# Patient Record
Sex: Female | Born: 1960 | Race: White | Hispanic: No | Marital: Single | Smoking: Former smoker
Health system: Southern US, Community
[De-identification: ages and names within clinical notes are randomized; demographics above are authoritative.]

## PROBLEM LIST (undated history)

## (undated) DIAGNOSIS — M1990 Unspecified osteoarthritis, unspecified site: Secondary | ICD-10-CM

## (undated) HISTORY — PX: BACK SURGERY: SHX140

## (undated) HISTORY — PX: BREAST SURGERY: SHX581

---

## 2011-01-17 ENCOUNTER — Emergency Department (HOSPITAL_COMMUNITY): Payer: Self-pay

## 2011-01-17 ENCOUNTER — Emergency Department (HOSPITAL_COMMUNITY)
Admission: EM | Admit: 2011-01-17 | Discharge: 2011-01-17 | Disposition: A | Discharge status: Home / Self Care | Payer: Self-pay | Attending: Emergency Medicine | Admitting: Emergency Medicine

## 2011-01-17 DIAGNOSIS — R9431 Abnormal electrocardiogram [ECG] [EKG]: Secondary | ICD-10-CM | POA: Insufficient documentation

## 2011-01-17 DIAGNOSIS — K219 Gastro-esophageal reflux disease without esophagitis: Secondary | ICD-10-CM | POA: Insufficient documentation

## 2011-01-17 DIAGNOSIS — F411 Generalized anxiety disorder: Secondary | ICD-10-CM | POA: Insufficient documentation

## 2011-01-17 DIAGNOSIS — R0789 Other chest pain: Secondary | ICD-10-CM | POA: Insufficient documentation

## 2011-01-17 DIAGNOSIS — Z79899 Other long term (current) drug therapy: Secondary | ICD-10-CM | POA: Insufficient documentation

## 2011-01-17 LAB — POCT CARDIAC MARKERS
CKMB, poc: 3.7 ng/mL (ref 1.0–8.0)
CKMB, poc: 2.8 ng/mL (ref 1.0–8.0)
Myoglobin, poc: 83.7 ng/mL (ref 12–200)
Myoglobin, poc: 55.6 ng/mL (ref 12–200)
Troponin i, poc: <0.05 ng/mL (ref 0.00–0.09)
Troponin i, poc: <0.05 ng/mL (ref 0.00–0.09)

## 2011-01-17 LAB — DIFFERENTIAL
Basophils Absolute: 0 10*3/uL (ref 0.0–0.1)
Basophils Relative: 1 % (ref 0–1)
Eosinophils Absolute: 0.2 10*3/uL (ref 0.0–0.7)
Eosinophils Relative: 2 % (ref 0–5)
Lymphocytes Relative: 29 % (ref 12–46)
Lymphs Abs: 2.3 10*3/uL (ref 0.7–4.0)
Monocytes Absolute: 0.3 10*3/uL (ref 0.1–1.0)
Monocytes Relative: 4 % (ref 3–12)
Neutro Abs: 5.2 10*3/uL (ref 1.7–7.7)
Neutrophils Relative %: 65 % (ref 43–77)

## 2011-01-17 LAB — CBC
HCT: 34.6 % — ABNORMAL LOW (ref 36.0–46.0)
Hemoglobin: 11.1 g/dL — ABNORMAL LOW (ref 12.0–15.0)
MCH: 25.9 pg — ABNORMAL LOW (ref 26.0–34.0)
MCHC: 32.1 g/dL (ref 30.0–36.0)
MCV: 80.8 fL (ref 78.0–100.0)
Platelets: 242 10*3/uL (ref 150–400)
RBC: 4.28 MIL/uL (ref 3.87–5.11)
RDW: 14.5 % (ref 11.5–15.5)
WBC: 7.9 10*3/uL (ref 4.0–10.5)

## 2011-01-17 LAB — BASIC METABOLIC PANEL
BUN: 10 mg/dL (ref 6–23)
CO2: 24 meq/L (ref 19–32)
Calcium: 9.6 mg/dL (ref 8.4–10.5)
Chloride: 104 meq/L (ref 96–112)
Creatinine, Ser: 0.81 mg/dL (ref 0.4–1.2)
GFR calc Af Amer: >60 mL/min (ref >60)
GFR calc non Af Amer: >60 mL/min (ref >60)
Glucose, Bld: 110 mg/dL — ABNORMAL HIGH (ref 70–99)
Potassium: 3.5 meq/L (ref 3.5–5.1)
Sodium: 137 meq/L (ref 135–145)

## 2011-05-22 ENCOUNTER — Encounter: Payer: Self-pay | Admitting: Emergency Medicine

## 2011-05-22 ENCOUNTER — Emergency Department (HOSPITAL_COMMUNITY)
Admission: EM | Admit: 2011-05-22 | Discharge: 2011-05-22 | Disposition: A | Discharge status: Home / Self Care | Payer: Self-pay | Attending: Emergency Medicine | Admitting: Emergency Medicine

## 2011-05-22 DIAGNOSIS — Z87891 Personal history of nicotine dependence: Secondary | ICD-10-CM | POA: Insufficient documentation

## 2011-05-22 DIAGNOSIS — L03119 Cellulitis of unspecified part of limb: Secondary | ICD-10-CM | POA: Insufficient documentation

## 2011-05-22 DIAGNOSIS — W57XXXA Bitten or stung by nonvenomous insect and other nonvenomous arthropods, initial encounter: Secondary | ICD-10-CM

## 2011-05-22 DIAGNOSIS — L02519 Cutaneous abscess of unspecified hand: Secondary | ICD-10-CM | POA: Insufficient documentation

## 2011-05-22 HISTORY — DX: Unspecified osteoarthritis, unspecified site: M19.90

## 2011-05-22 MED ORDER — DOXYCYCLINE HYCLATE 100 MG PO CAPS: 100.0000 mg | ORAL_CAPSULE | Freq: Two times a day (BID) | ORAL | Status: AC

## 2011-05-22 MED ORDER — DOXYCYCLINE HYCLATE 100 MG PO TABS
100.0000 mg | ORAL_TABLET | Freq: Once | ORAL | Status: AC
  Administered 2011-05-22: 100 mg via ORAL
  Filled 2011-05-22: qty 1

## 2011-05-22 NOTE — Discharge Instructions (Signed)
Bug Bites Mosquitoes, flies, fleas, bedbugs, black flies, sand flies, spiders, ticks and many other insects can bite. Insect bites are different from insect stings. A sting is when venom is injected into the skin. Most of the time bug bites redden, swell up and itch for 2 to 4 days, and then go away. First aid for most bug bites includes washing the area thoroughly with soap and water. You may use ice or a cold pack to reduce the swelling. You can also apply a small amount of meat tenderizer or baking soda paste to the bite area for a short period. Keep the area clean and do not scratch when it itches. You can reduce the itching and swelling by applying a cortisone cream or calamine to the bite area 4 times a day. Antihistamine and cortisone medicines may be used to relieve symptoms. Some insect bites can transmit infectious diseases, for example: ticks (Lyme disease) and mosquitoes (West Nile virus). Bug bites can get infected or cause you to become ill. If the area near the bite becomes more swollen, red, or painful over the next 2 days, you may need to start antibiotics. YOU MIGHT NEED A TETANUS SHOT NOW IF:  You have no idea when you had the last one.   You have never had a tetanus shot before.   Your bite has dirt in it.   If you need a tetanus shot, and you decide not to get one, there is a rare chance of getting tetanus. Sickness from tetanus can be serious.  If you got a tetanus shot, your arm may swell, get red and warm to the touch at the shot site. This is common and not a problem. SEEK IMMEDIATE MEDICAL CARE IF:  There is increased pain, redness or swelling in the bite area or a red streak develops on the skin near the bite area.   You or your child has an oral temperature above 102 F (38.9 C), not controlled by medicine.   Your baby is older than 3 months with a rectal temperature of 102 F (38.9 C) or higher.   Your baby is 7 months old or younger with a rectal temperature of  100.4 F (38 C) or higher.   You or your child develops new joint pain.   You or your child develops a headache or neck pain.   You or your child develops unusual weakness or loss of strength.   You or your child develops a rash.  Document Released: 11/10/2004 Document Re-Released: 12/28/2009 [facility] Patient Information 2011 [place], [place].   Take the antibiotic as directed.  Wash with soap and water twice daily.  Soak in warm water 5-10 min several times daily.  Return as needed.

## 2011-05-22 NOTE — ED Provider Notes (Signed)
History     CSN: [id number] Arrival date & time: 05/22/2011  9:21 AM  Chief Complaint  Patient presents with  . Abscess   Patient is a 50 y.o. female presenting with abscess. The history is provided by the patient (pt thinks a spider may have bit on the hand ~ 3 weeks ago.).  Abscess  The abscess is present on the right hand. The problem is moderate. The abscess is characterized by redness. The abscess first occurred at home. Pertinent negatives include no anorexia, no decrease in physical activity, no fever, no fussiness, not sleeping more and no rhinorrhea.    Past Medical History  Diagnosis Date  . Arthritis     Past Surgical History  Procedure Date  . Back surgery   . Breast surgery     Family History  Problem Relation Age of Onset  . Arthritis Mother     History  Substance Use Topics  . Smoking status: Former Games developer  . Smokeless tobacco: Not on file  . Alcohol Use: No    OB History    Grav Para Term Preterm Abortions TAB SAB Ect Mult Living                  Review of Systems  Constitutional: Negative for fever.  HENT: Negative for rhinorrhea.   Gastrointestinal: Negative for anorexia.  Skin: Positive for wound.  All other systems reviewed and are negative.    Physical Exam  BP 102/59  Pulse 90  Temp(Src) 98.1 F (36.7 C) (Oral)  Resp 18  LMP 05/22/2011  Physical Exam  Nursing note and vitals reviewed. Constitutional: She is oriented to person, place, and time. Vital signs are normal. She appears well-developed and well-nourished. No distress.  HENT:  Head: Normocephalic and atraumatic.  Right Ear: External ear normal.  Left Ear: External ear normal.  Nose: Nose normal.  Mouth/Throat: No oropharyngeal exudate.  Eyes: Conjunctivae and EOM are normal. Pupils are equal, round, and reactive to light. Right eye exhibits no discharge. Left eye exhibits no discharge. No scleral icterus.  Neck: Normal range of motion. Neck supple. No JVD present. No  tracheal deviation present. No thyromegaly present.  Cardiovascular: Normal rate, regular rhythm, normal heart sounds, intact distal pulses and normal pulses.  Exam reveals no gallop and no friction rub.   No murmur heard. Pulmonary/Chest: Effort normal and breath sounds normal. No stridor. No respiratory distress. She has no wheezes. She has no rales. She exhibits no tenderness.  Abdominal: Soft. Normal appearance and bowel sounds are normal. She exhibits no distension and no mass. There is no tenderness. There is no rebound and no guarding.  Musculoskeletal: Normal range of motion. She exhibits no edema and no tenderness.       Right hand: She exhibits tenderness. She exhibits normal range of motion, no bony tenderness, normal capillary refill, no deformity, no laceration and no swelling. normal sensation noted. Normal strength noted.       Hands: Lymphadenopathy:    She has no cervical adenopathy.  Neurological: She is alert and oriented to person, place, and time. She has normal reflexes. No cranial nerve deficit. Coordination normal. GCS eye subscore is 4. GCS verbal subscore is 5. GCS motor subscore is 6.  Reflex Scores:      Tricep reflexes are 2+ on the right side and 2+ on the left side.      Bicep reflexes are 2+ on the right side and 2+ on the left side.  Brachioradialis reflexes are 2+ on the right side and 2+ on the left side.      Patellar reflexes are 2+ on the right side and 2+ on the left side.      Achilles reflexes are 2+ on the right side and 2+ on the left side. Skin: Skin is warm and dry. No rash noted. She is not diaphoretic. There is erythema.  Psychiatric: She has a normal mood and affect. Her speech is normal and behavior is normal. Judgment and thought content normal. Cognition and memory are normal.    ED Course  Procedures  MDM       Worthy Rancher, PA 05/22/11 1014  Worthy Rancher, PA 05/22/11 1016  Worthy Rancher, [place] 05/22/11 1019

## 2011-05-22 NOTE — ED Notes (Signed)
Pt noticed a sore x 3 wks ago around r ring finger. States lately has been more red and swelling. Redness and slight swelling noted. Nad. Denies pain.

## 2011-05-23 NOTE — ED Provider Notes (Signed)
Medical screening examination/treatment/procedure(s) were performed by non-physician practitioner and as supervising physician I was immediately available for consultation/collaboration.   Nechelle Petrizzo B. Bernette Mayers, MD 05/23/11 [phone]

## 2016-02-03 DIAGNOSIS — Z6841 Body Mass Index (BMI) 40.0 and over, adult: Secondary | ICD-10-CM | POA: Diagnosis not present

## 2016-02-03 DIAGNOSIS — M5432 Sciatica, left side: Secondary | ICD-10-CM | POA: Diagnosis not present

## 2016-03-25 DIAGNOSIS — M47816 Spondylosis without myelopathy or radiculopathy, lumbar region: Secondary | ICD-10-CM | POA: Diagnosis not present

## 2016-03-25 DIAGNOSIS — M5432 Sciatica, left side: Secondary | ICD-10-CM | POA: Diagnosis not present

## 2016-03-25 DIAGNOSIS — Z6841 Body Mass Index (BMI) 40.0 and over, adult: Secondary | ICD-10-CM | POA: Diagnosis not present

## 2016-03-25 DIAGNOSIS — M5442 Lumbago with sciatica, left side: Secondary | ICD-10-CM | POA: Diagnosis not present

## 2016-05-10 DIAGNOSIS — F419 Anxiety disorder, unspecified: Secondary | ICD-10-CM | POA: Diagnosis not present

## 2016-05-10 DIAGNOSIS — E8881 Metabolic syndrome: Secondary | ICD-10-CM | POA: Diagnosis not present

## 2016-05-10 DIAGNOSIS — E781 Pure hyperglyceridemia: Secondary | ICD-10-CM | POA: Diagnosis not present

## 2016-05-10 DIAGNOSIS — R7301 Impaired fasting glucose: Secondary | ICD-10-CM | POA: Diagnosis not present

## 2016-05-12 DIAGNOSIS — M5432 Sciatica, left side: Secondary | ICD-10-CM | POA: Diagnosis not present

## 2016-05-12 DIAGNOSIS — E6609 Other obesity due to excess calories: Secondary | ICD-10-CM | POA: Diagnosis not present

## 2016-05-12 DIAGNOSIS — R7301 Impaired fasting glucose: Secondary | ICD-10-CM | POA: Diagnosis not present

## 2016-05-12 DIAGNOSIS — E781 Pure hyperglyceridemia: Secondary | ICD-10-CM | POA: Diagnosis not present

## 2016-05-12 DIAGNOSIS — E8881 Metabolic syndrome: Secondary | ICD-10-CM | POA: Diagnosis not present

## 2016-08-09 DIAGNOSIS — Z1231 Encounter for screening mammogram for malignant neoplasm of breast: Secondary | ICD-10-CM | POA: Diagnosis not present

## 2016-08-09 DIAGNOSIS — R928 Other abnormal and inconclusive findings on diagnostic imaging of breast: Secondary | ICD-10-CM | POA: Diagnosis not present

## 2016-08-24 DIAGNOSIS — N63 Unspecified lump in unspecified breast: Secondary | ICD-10-CM | POA: Diagnosis not present

## 2016-08-24 DIAGNOSIS — Z1239 Encounter for other screening for malignant neoplasm of breast: Secondary | ICD-10-CM | POA: Diagnosis not present

## 2016-08-24 DIAGNOSIS — R921 Mammographic calcification found on diagnostic imaging of breast: Secondary | ICD-10-CM | POA: Diagnosis not present

## 2016-08-24 DIAGNOSIS — N6012 Diffuse cystic mastopathy of left breast: Secondary | ICD-10-CM | POA: Diagnosis not present

## 2016-09-10 DIAGNOSIS — H5213 Myopia, bilateral: Secondary | ICD-10-CM | POA: Diagnosis not present

## 2016-09-14 DIAGNOSIS — E781 Pure hyperglyceridemia: Secondary | ICD-10-CM | POA: Diagnosis not present

## 2016-09-14 DIAGNOSIS — E8881 Metabolic syndrome: Secondary | ICD-10-CM | POA: Diagnosis not present

## 2016-09-14 DIAGNOSIS — F419 Anxiety disorder, unspecified: Secondary | ICD-10-CM | POA: Diagnosis not present

## 2016-09-14 DIAGNOSIS — R7301 Impaired fasting glucose: Secondary | ICD-10-CM | POA: Diagnosis not present

## 2016-09-19 DIAGNOSIS — R7301 Impaired fasting glucose: Secondary | ICD-10-CM | POA: Diagnosis not present

## 2016-09-19 DIAGNOSIS — Z23 Encounter for immunization: Secondary | ICD-10-CM | POA: Diagnosis not present

## 2016-09-19 DIAGNOSIS — E6609 Other obesity due to excess calories: Secondary | ICD-10-CM | POA: Diagnosis not present

## 2016-09-19 DIAGNOSIS — E8881 Metabolic syndrome: Secondary | ICD-10-CM | POA: Diagnosis not present

## 2016-09-19 DIAGNOSIS — E781 Pure hyperglyceridemia: Secondary | ICD-10-CM | POA: Diagnosis not present

## 2016-11-09 DIAGNOSIS — N840 Polyp of corpus uteri: Secondary | ICD-10-CM | POA: Diagnosis not present

## 2016-11-09 DIAGNOSIS — Z6841 Body Mass Index (BMI) 40.0 and over, adult: Secondary | ICD-10-CM | POA: Diagnosis not present

## 2016-11-09 DIAGNOSIS — N950 Postmenopausal bleeding: Secondary | ICD-10-CM | POA: Diagnosis not present

## 2016-12-06 DIAGNOSIS — Z6841 Body Mass Index (BMI) 40.0 and over, adult: Secondary | ICD-10-CM | POA: Diagnosis not present

## 2016-12-06 DIAGNOSIS — N950 Postmenopausal bleeding: Secondary | ICD-10-CM | POA: Diagnosis not present

## 2016-12-30 DIAGNOSIS — M7552 Bursitis of left shoulder: Secondary | ICD-10-CM | POA: Diagnosis not present

## 2017-03-01 DIAGNOSIS — N6489 Other specified disorders of breast: Secondary | ICD-10-CM | POA: Diagnosis not present

## 2017-03-01 DIAGNOSIS — N6012 Diffuse cystic mastopathy of left breast: Secondary | ICD-10-CM | POA: Diagnosis not present

## 2017-03-21 DIAGNOSIS — E781 Pure hyperglyceridemia: Secondary | ICD-10-CM | POA: Diagnosis not present

## 2017-03-21 DIAGNOSIS — E8881 Metabolic syndrome: Secondary | ICD-10-CM | POA: Diagnosis not present

## 2017-03-23 DIAGNOSIS — M5431 Sciatica, right side: Secondary | ICD-10-CM | POA: Diagnosis not present

## 2017-03-23 DIAGNOSIS — R7301 Impaired fasting glucose: Secondary | ICD-10-CM | POA: Diagnosis not present

## 2017-03-23 DIAGNOSIS — E6609 Other obesity due to excess calories: Secondary | ICD-10-CM | POA: Diagnosis not present

## 2017-03-23 DIAGNOSIS — E781 Pure hyperglyceridemia: Secondary | ICD-10-CM | POA: Diagnosis not present

## 2017-03-23 DIAGNOSIS — E8881 Metabolic syndrome: Secondary | ICD-10-CM | POA: Diagnosis not present

## 2017-04-14 DIAGNOSIS — Z6839 Body mass index (BMI) 39.0-39.9, adult: Secondary | ICD-10-CM | POA: Diagnosis not present

## 2017-04-14 DIAGNOSIS — M79604 Pain in right leg: Secondary | ICD-10-CM | POA: Diagnosis not present

## 2017-04-28 DIAGNOSIS — M5441 Lumbago with sciatica, right side: Secondary | ICD-10-CM | POA: Diagnosis not present

## 2017-04-28 DIAGNOSIS — M9906 Segmental and somatic dysfunction of lower extremity: Secondary | ICD-10-CM | POA: Diagnosis not present

## 2017-04-28 DIAGNOSIS — M79661 Pain in right lower leg: Secondary | ICD-10-CM | POA: Diagnosis not present

## 2017-04-28 DIAGNOSIS — M9905 Segmental and somatic dysfunction of pelvic region: Secondary | ICD-10-CM | POA: Diagnosis not present

## 2017-05-01 DIAGNOSIS — M5441 Lumbago with sciatica, right side: Secondary | ICD-10-CM | POA: Diagnosis not present

## 2017-05-01 DIAGNOSIS — M9905 Segmental and somatic dysfunction of pelvic region: Secondary | ICD-10-CM | POA: Diagnosis not present

## 2017-05-01 DIAGNOSIS — M9906 Segmental and somatic dysfunction of lower extremity: Secondary | ICD-10-CM | POA: Diagnosis not present

## 2017-05-01 DIAGNOSIS — M79661 Pain in right lower leg: Secondary | ICD-10-CM | POA: Diagnosis not present

## 2017-05-08 DIAGNOSIS — M9905 Segmental and somatic dysfunction of pelvic region: Secondary | ICD-10-CM | POA: Diagnosis not present

## 2017-05-08 DIAGNOSIS — M9906 Segmental and somatic dysfunction of lower extremity: Secondary | ICD-10-CM | POA: Diagnosis not present

## 2017-05-08 DIAGNOSIS — M79661 Pain in right lower leg: Secondary | ICD-10-CM | POA: Diagnosis not present

## 2017-05-08 DIAGNOSIS — M5441 Lumbago with sciatica, right side: Secondary | ICD-10-CM | POA: Diagnosis not present

## 2017-05-10 DIAGNOSIS — M5441 Lumbago with sciatica, right side: Secondary | ICD-10-CM | POA: Diagnosis not present

## 2017-05-10 DIAGNOSIS — M9905 Segmental and somatic dysfunction of pelvic region: Secondary | ICD-10-CM | POA: Diagnosis not present

## 2017-05-10 DIAGNOSIS — M9906 Segmental and somatic dysfunction of lower extremity: Secondary | ICD-10-CM | POA: Diagnosis not present

## 2017-05-10 DIAGNOSIS — M79661 Pain in right lower leg: Secondary | ICD-10-CM | POA: Diagnosis not present

## 2017-05-15 DIAGNOSIS — M5441 Lumbago with sciatica, right side: Secondary | ICD-10-CM | POA: Diagnosis not present

## 2017-05-15 DIAGNOSIS — M9906 Segmental and somatic dysfunction of lower extremity: Secondary | ICD-10-CM | POA: Diagnosis not present

## 2017-05-15 DIAGNOSIS — M9905 Segmental and somatic dysfunction of pelvic region: Secondary | ICD-10-CM | POA: Diagnosis not present

## 2017-05-15 DIAGNOSIS — M79661 Pain in right lower leg: Secondary | ICD-10-CM | POA: Diagnosis not present

## 2017-05-17 DIAGNOSIS — M9905 Segmental and somatic dysfunction of pelvic region: Secondary | ICD-10-CM | POA: Diagnosis not present

## 2017-05-17 DIAGNOSIS — M5441 Lumbago with sciatica, right side: Secondary | ICD-10-CM | POA: Diagnosis not present

## 2017-05-17 DIAGNOSIS — M9906 Segmental and somatic dysfunction of lower extremity: Secondary | ICD-10-CM | POA: Diagnosis not present

## 2017-05-17 DIAGNOSIS — M79661 Pain in right lower leg: Secondary | ICD-10-CM | POA: Diagnosis not present

## 2017-05-22 DIAGNOSIS — M5441 Lumbago with sciatica, right side: Secondary | ICD-10-CM | POA: Diagnosis not present

## 2017-05-22 DIAGNOSIS — M9905 Segmental and somatic dysfunction of pelvic region: Secondary | ICD-10-CM | POA: Diagnosis not present

## 2017-05-22 DIAGNOSIS — M9906 Segmental and somatic dysfunction of lower extremity: Secondary | ICD-10-CM | POA: Diagnosis not present

## 2017-05-22 DIAGNOSIS — M79661 Pain in right lower leg: Secondary | ICD-10-CM | POA: Diagnosis not present

## 2017-05-24 DIAGNOSIS — M5441 Lumbago with sciatica, right side: Secondary | ICD-10-CM | POA: Diagnosis not present

## 2017-05-24 DIAGNOSIS — M9906 Segmental and somatic dysfunction of lower extremity: Secondary | ICD-10-CM | POA: Diagnosis not present

## 2017-05-24 DIAGNOSIS — M79661 Pain in right lower leg: Secondary | ICD-10-CM | POA: Diagnosis not present

## 2017-05-24 DIAGNOSIS — M9905 Segmental and somatic dysfunction of pelvic region: Secondary | ICD-10-CM | POA: Diagnosis not present

## 2017-05-31 DIAGNOSIS — M5441 Lumbago with sciatica, right side: Secondary | ICD-10-CM | POA: Diagnosis not present

## 2017-05-31 DIAGNOSIS — M9905 Segmental and somatic dysfunction of pelvic region: Secondary | ICD-10-CM | POA: Diagnosis not present

## 2017-05-31 DIAGNOSIS — M79661 Pain in right lower leg: Secondary | ICD-10-CM | POA: Diagnosis not present

## 2017-05-31 DIAGNOSIS — M9906 Segmental and somatic dysfunction of lower extremity: Secondary | ICD-10-CM | POA: Diagnosis not present

## 2017-06-07 DIAGNOSIS — M79661 Pain in right lower leg: Secondary | ICD-10-CM | POA: Diagnosis not present

## 2017-06-07 DIAGNOSIS — M9906 Segmental and somatic dysfunction of lower extremity: Secondary | ICD-10-CM | POA: Diagnosis not present

## 2017-06-07 DIAGNOSIS — M9905 Segmental and somatic dysfunction of pelvic region: Secondary | ICD-10-CM | POA: Diagnosis not present

## 2017-06-07 DIAGNOSIS — M5441 Lumbago with sciatica, right side: Secondary | ICD-10-CM | POA: Diagnosis not present

## 2017-06-14 DIAGNOSIS — M5441 Lumbago with sciatica, right side: Secondary | ICD-10-CM | POA: Diagnosis not present

## 2017-06-14 DIAGNOSIS — M79661 Pain in right lower leg: Secondary | ICD-10-CM | POA: Diagnosis not present

## 2017-06-14 DIAGNOSIS — M9905 Segmental and somatic dysfunction of pelvic region: Secondary | ICD-10-CM | POA: Diagnosis not present

## 2017-06-14 DIAGNOSIS — M9906 Segmental and somatic dysfunction of lower extremity: Secondary | ICD-10-CM | POA: Diagnosis not present

## 2017-07-05 DIAGNOSIS — M9906 Segmental and somatic dysfunction of lower extremity: Secondary | ICD-10-CM | POA: Diagnosis not present

## 2017-07-05 DIAGNOSIS — M5441 Lumbago with sciatica, right side: Secondary | ICD-10-CM | POA: Diagnosis not present

## 2017-07-05 DIAGNOSIS — M79661 Pain in right lower leg: Secondary | ICD-10-CM | POA: Diagnosis not present

## 2017-07-05 DIAGNOSIS — M9905 Segmental and somatic dysfunction of pelvic region: Secondary | ICD-10-CM | POA: Diagnosis not present

## 2017-07-14 DIAGNOSIS — N6320 Unspecified lump in the left breast, unspecified quadrant: Secondary | ICD-10-CM | POA: Diagnosis not present

## 2017-07-14 DIAGNOSIS — E781 Pure hyperglyceridemia: Secondary | ICD-10-CM | POA: Diagnosis not present

## 2017-07-14 DIAGNOSIS — R7301 Impaired fasting glucose: Secondary | ICD-10-CM | POA: Diagnosis not present

## 2017-07-14 DIAGNOSIS — E8881 Metabolic syndrome: Secondary | ICD-10-CM | POA: Diagnosis not present

## 2017-07-14 DIAGNOSIS — F419 Anxiety disorder, unspecified: Secondary | ICD-10-CM | POA: Diagnosis not present

## 2017-07-18 DIAGNOSIS — E8881 Metabolic syndrome: Secondary | ICD-10-CM | POA: Diagnosis not present

## 2017-07-18 DIAGNOSIS — E6609 Other obesity due to excess calories: Secondary | ICD-10-CM | POA: Diagnosis not present

## 2017-07-18 DIAGNOSIS — Z23 Encounter for immunization: Secondary | ICD-10-CM | POA: Diagnosis not present

## 2017-07-18 DIAGNOSIS — R7301 Impaired fasting glucose: Secondary | ICD-10-CM | POA: Diagnosis not present

## 2017-07-18 DIAGNOSIS — E781 Pure hyperglyceridemia: Secondary | ICD-10-CM | POA: Diagnosis not present

## 2017-08-05 DIAGNOSIS — L03319 Cellulitis of trunk, unspecified: Secondary | ICD-10-CM | POA: Diagnosis not present

## 2017-08-05 DIAGNOSIS — Z6839 Body mass index (BMI) 39.0-39.9, adult: Secondary | ICD-10-CM | POA: Diagnosis not present

## 2017-08-09 DIAGNOSIS — L03319 Cellulitis of trunk, unspecified: Secondary | ICD-10-CM | POA: Diagnosis not present

## 2017-08-16 DIAGNOSIS — M9905 Segmental and somatic dysfunction of pelvic region: Secondary | ICD-10-CM | POA: Diagnosis not present

## 2017-08-16 DIAGNOSIS — M79661 Pain in right lower leg: Secondary | ICD-10-CM | POA: Diagnosis not present

## 2017-08-16 DIAGNOSIS — M5441 Lumbago with sciatica, right side: Secondary | ICD-10-CM | POA: Diagnosis not present

## 2017-08-16 DIAGNOSIS — M9906 Segmental and somatic dysfunction of lower extremity: Secondary | ICD-10-CM | POA: Diagnosis not present

## 2017-10-18 DIAGNOSIS — Z6838 Body mass index (BMI) 38.0-38.9, adult: Secondary | ICD-10-CM | POA: Diagnosis not present

## 2017-10-18 DIAGNOSIS — L03311 Cellulitis of abdominal wall: Secondary | ICD-10-CM | POA: Diagnosis not present

## 2018-01-09 DIAGNOSIS — E781 Pure hyperglyceridemia: Secondary | ICD-10-CM | POA: Diagnosis not present

## 2018-01-09 DIAGNOSIS — M545 Low back pain: Secondary | ICD-10-CM | POA: Diagnosis not present

## 2018-01-09 DIAGNOSIS — E8881 Metabolic syndrome: Secondary | ICD-10-CM | POA: Diagnosis not present

## 2018-01-09 DIAGNOSIS — R7301 Impaired fasting glucose: Secondary | ICD-10-CM | POA: Diagnosis not present

## 2018-01-15 DIAGNOSIS — E781 Pure hyperglyceridemia: Secondary | ICD-10-CM | POA: Diagnosis not present

## 2018-01-15 DIAGNOSIS — E6609 Other obesity due to excess calories: Secondary | ICD-10-CM | POA: Diagnosis not present

## 2018-01-15 DIAGNOSIS — R7301 Impaired fasting glucose: Secondary | ICD-10-CM | POA: Diagnosis not present

## 2018-01-15 DIAGNOSIS — E8881 Metabolic syndrome: Secondary | ICD-10-CM | POA: Diagnosis not present

## 2018-07-11 DIAGNOSIS — E8881 Metabolic syndrome: Secondary | ICD-10-CM | POA: Diagnosis not present

## 2018-07-11 DIAGNOSIS — R7301 Impaired fasting glucose: Secondary | ICD-10-CM | POA: Diagnosis not present

## 2018-07-11 DIAGNOSIS — E781 Pure hyperglyceridemia: Secondary | ICD-10-CM | POA: Diagnosis not present

## 2018-07-11 DIAGNOSIS — Z13220 Encounter for screening for lipoid disorders: Secondary | ICD-10-CM | POA: Diagnosis not present

## 2018-07-17 DIAGNOSIS — Z23 Encounter for immunization: Secondary | ICD-10-CM | POA: Diagnosis not present

## 2018-07-17 DIAGNOSIS — R7301 Impaired fasting glucose: Secondary | ICD-10-CM | POA: Diagnosis not present

## 2018-07-17 DIAGNOSIS — E8881 Metabolic syndrome: Secondary | ICD-10-CM | POA: Diagnosis not present

## 2018-07-17 DIAGNOSIS — E781 Pure hyperglyceridemia: Secondary | ICD-10-CM | POA: Diagnosis not present

## 2018-07-17 DIAGNOSIS — E6609 Other obesity due to excess calories: Secondary | ICD-10-CM | POA: Diagnosis not present

## 2018-12-28 DIAGNOSIS — Z1231 Encounter for screening mammogram for malignant neoplasm of breast: Secondary | ICD-10-CM | POA: Diagnosis not present

## 2019-01-11 DIAGNOSIS — E8881 Metabolic syndrome: Secondary | ICD-10-CM | POA: Diagnosis not present

## 2019-01-11 DIAGNOSIS — R7301 Impaired fasting glucose: Secondary | ICD-10-CM | POA: Diagnosis not present

## 2019-01-11 DIAGNOSIS — Z1159 Encounter for screening for other viral diseases: Secondary | ICD-10-CM | POA: Diagnosis not present

## 2019-01-16 DIAGNOSIS — N6001 Solitary cyst of right breast: Secondary | ICD-10-CM | POA: Diagnosis not present

## 2019-01-16 DIAGNOSIS — N6311 Unspecified lump in the right breast, upper outer quadrant: Secondary | ICD-10-CM | POA: Diagnosis not present

## 2019-01-17 DIAGNOSIS — E6609 Other obesity due to excess calories: Secondary | ICD-10-CM | POA: Diagnosis not present

## 2019-01-17 DIAGNOSIS — E781 Pure hyperglyceridemia: Secondary | ICD-10-CM | POA: Diagnosis not present

## 2019-01-17 DIAGNOSIS — E8881 Metabolic syndrome: Secondary | ICD-10-CM | POA: Diagnosis not present

## 2019-01-17 DIAGNOSIS — R7301 Impaired fasting glucose: Secondary | ICD-10-CM | POA: Diagnosis not present

## 2019-02-14 DIAGNOSIS — Z79899 Other long term (current) drug therapy: Secondary | ICD-10-CM | POA: Diagnosis not present

## 2019-02-14 DIAGNOSIS — M5416 Radiculopathy, lumbar region: Secondary | ICD-10-CM | POA: Diagnosis not present

## 2019-02-14 DIAGNOSIS — G8929 Other chronic pain: Secondary | ICD-10-CM | POA: Diagnosis not present

## 2019-02-14 DIAGNOSIS — M545 Low back pain: Secondary | ICD-10-CM | POA: Diagnosis not present

## 2019-02-20 DIAGNOSIS — N644 Mastodynia: Secondary | ICD-10-CM | POA: Diagnosis not present

## 2019-02-20 DIAGNOSIS — R946 Abnormal results of thyroid function studies: Secondary | ICD-10-CM | POA: Diagnosis not present

## 2019-02-20 DIAGNOSIS — E559 Vitamin D deficiency, unspecified: Secondary | ICD-10-CM | POA: Diagnosis not present

## 2019-02-20 DIAGNOSIS — Z6837 Body mass index (BMI) 37.0-37.9, adult: Secondary | ICD-10-CM | POA: Diagnosis not present

## 2019-02-20 DIAGNOSIS — R200 Anesthesia of skin: Secondary | ICD-10-CM | POA: Diagnosis not present

## 2019-02-20 DIAGNOSIS — M069 Rheumatoid arthritis, unspecified: Secondary | ICD-10-CM | POA: Diagnosis not present

## 2019-02-20 DIAGNOSIS — M545 Low back pain: Secondary | ICD-10-CM | POA: Diagnosis not present

## 2019-02-20 DIAGNOSIS — R3914 Feeling of incomplete bladder emptying: Secondary | ICD-10-CM | POA: Diagnosis not present

## 2019-02-20 DIAGNOSIS — M25511 Pain in right shoulder: Secondary | ICD-10-CM | POA: Diagnosis not present

## 2019-02-26 DIAGNOSIS — M545 Low back pain: Secondary | ICD-10-CM | POA: Diagnosis not present

## 2019-02-26 DIAGNOSIS — R768 Other specified abnormal immunological findings in serum: Secondary | ICD-10-CM | POA: Diagnosis not present

## 2019-03-01 DIAGNOSIS — M545 Low back pain: Secondary | ICD-10-CM | POA: Diagnosis not present

## 2019-03-04 DIAGNOSIS — M545 Low back pain: Secondary | ICD-10-CM | POA: Diagnosis not present

## 2019-03-12 ENCOUNTER — Inpatient Hospital Stay (HOSPITAL_COMMUNITY): Payer: BC Managed Care – PPO | Attending: Hematology | Admitting: Hematology

## 2019-03-12 ENCOUNTER — Encounter (HOSPITAL_COMMUNITY): Payer: Self-pay | Admitting: *Deleted

## 2019-03-12 ENCOUNTER — Encounter (HOSPITAL_COMMUNITY): Payer: Self-pay | Admitting: Hematology

## 2019-03-12 ENCOUNTER — Inpatient Hospital Stay (HOSPITAL_COMMUNITY): Payer: BC Managed Care – PPO

## 2019-03-12 ENCOUNTER — Encounter (HOSPITAL_COMMUNITY): Payer: Self-pay | Admitting: Lab

## 2019-03-12 ENCOUNTER — Other Ambulatory Visit: Payer: Self-pay

## 2019-03-12 VITALS — BP 136/70 | HR 94 | Temp 98.5°F | Resp 18 | Wt 222.7 lb

## 2019-03-12 DIAGNOSIS — M899 Disorder of bone, unspecified: Secondary | ICD-10-CM

## 2019-03-12 DIAGNOSIS — Z79899 Other long term (current) drug therapy: Secondary | ICD-10-CM

## 2019-03-12 DIAGNOSIS — R531 Weakness: Secondary | ICD-10-CM

## 2019-03-12 DIAGNOSIS — M549 Dorsalgia, unspecified: Secondary | ICD-10-CM

## 2019-03-12 LAB — COMPREHENSIVE METABOLIC PANEL
ALT: 69 U/L — ABNORMAL HIGH (ref 0–44)
AST: 34 U/L (ref 15–41)
Albumin: 3 g/dL — ABNORMAL LOW (ref 3.5–5.0)
Alkaline Phosphatase: 310 U/L — ABNORMAL HIGH (ref 38–126)
Anion gap: 15 (ref 5–15)
BUN: 17 mg/dL (ref 6–20)
CO2: 23 mmol/L (ref 22–32)
Calcium: 9.7 mg/dL (ref 8.9–10.3)
Chloride: 102 mmol/L (ref 98–111)
Creatinine, Ser: 0.51 mg/dL (ref 0.44–1.00)
GFR calc Af Amer: >60 mL/min (ref >60)
GFR calc non Af Amer: >60 mL/min (ref >60)
Glucose, Bld: 114 mg/dL — ABNORMAL HIGH (ref 70–99)
Potassium: 3.8 mmol/L (ref 3.5–5.1)
Sodium: 140 mmol/L (ref 135–145)
Total Bilirubin: 0.4 mg/dL (ref 0.3–1.2)
Total Protein: 7.2 g/dL (ref 6.5–8.1)

## 2019-03-12 LAB — CBC WITH DIFFERENTIAL/PLATELET
Abs Immature Granulocytes: 0.95 10*3/uL — ABNORMAL HIGH (ref 0.00–0.07)
Basophils Absolute: 0.1 10*3/uL (ref 0.0–0.1)
Basophils Relative: 1 %
Eosinophils Absolute: 0 10*3/uL (ref 0.0–0.5)
Eosinophils Relative: 0 %
HCT: 34.4 % — ABNORMAL LOW (ref 36.0–46.0)
Hemoglobin: 10.6 g/dL — ABNORMAL LOW (ref 12.0–15.0)
Immature Granulocytes: 9 %
Lymphocytes Relative: 18 %
Lymphs Abs: 1.8 10*3/uL (ref 0.7–4.0)
MCH: 26.7 pg (ref 26.0–34.0)
MCHC: 30.8 g/dL (ref 30.0–36.0)
MCV: 86.6 fL (ref 80.0–100.0)
Monocytes Absolute: 0.5 10*3/uL (ref 0.1–1.0)
Monocytes Relative: 5 %
Neutro Abs: 6.7 10*3/uL (ref 1.7–7.7)
Neutrophils Relative %: 67 %
Platelets: 313 10*3/uL (ref 150–400)
RBC: 3.97 MIL/uL (ref 3.87–5.11)
RDW: 13.8 % (ref 11.5–15.5)
WBC: 10.1 10*3/uL (ref 4.0–10.5)
nRBC: 0 % (ref 0.0–0.2)

## 2019-03-12 NOTE — Progress Notes (Signed)
Oncology Navigator Note:  Patient was referred to our office by Dr. Oneita Kras, orthopedic surgeon. I met with patient and her husband, Shanon Brow, during the visit with Dr. Delton Coombes.  I provided the patient with my contact information.  I explained my role in her care and expressed the need for them to call me should they have any questions or concerns.  They both were given the opportunity to ask questions and they were answered to their satisfaction.  I will meet with them again at their next visit.

## 2019-03-12 NOTE — Progress Notes (Unsigned)
Referral sent to [facility] Neurosurgery.  Records faxed on 5/26.

## 2019-03-12 NOTE — Assessment & Plan Note (Addendum)
1.  Metastatic bone lesions: - Back Pain with BLE weakness (left more than right) aprroxamatley 4 weeks ago. - 03/01/2019: MRI Lumbar spine:  Innumerable bony lesions in the lumbarscaral region.  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  - Concern for Multiple Myeloma versus metastatic disease.Recommend MRI of thoracic spine as well as PET/CT to evaluate for further disease.  - Physical exam revealed 1-2 cm enlarged left axilla LN. Recommend surgical evaluation for possible biopsy. - Will also complete blood work today with multiple myeloma labs. -I will also recommend consultation from neurosurgery for the epidural tumor at L5 level. - RTC after work up to discuss treatment plan.

## 2019-03-12 NOTE — Progress Notes (Signed)
[facility] Initial Visit:  Patient Care Team: Sasser, Silvestre Moment, MD as PCP - General (Family Medicine)  CHIEF COMPLAINTS/PURPOSE OF CONSULTATION: Oncology referral for spinal lytic lesions.   HISTORY OF PRESENTING ILLNESS: Brandi Rodriguez 58 y.o. female presents today for consult regarding lytic lesions noted on spine MRI. Reports she developed back pain with alternating bilateral lower extremity pain approximately 4 weeks ago. Movement makes the pain worse. She notes left sided weakness greater than right. She denies neuropathy, but describes pain in bilateral lower extremities. She is now walking with a cane and presents in a wheelchair today.  She was sent for Lumbar MRI on 03/01/2019. Scan revealed innumerable bony lesions in the lumbarscaral region.  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   She denies any fevers, chills, or night sweats. Denies any change in bowel or bladder habits. No weight loss. Appetite is stable. She has a history of smoking. Smoking 1ppd x 15 years, quitting 10 years ago. She denies a family history of cancer. She has worked in several factories as a Scientist, forensic.    Review of Systems  Constitutional: Negative.   HENT:  Negative.   Eyes: Negative.   Respiratory: Negative.   Cardiovascular: Negative.   Gastrointestinal: Negative.   Endocrine: Negative.   Genitourinary: Negative.    Musculoskeletal: Positive for arthralgias, back pain and gait problem.  Skin: Negative.   Neurological: Positive for extremity weakness and gait problem.  Hematological: Negative.   Psychiatric/Behavioral: Negative.     MEDICAL HISTORY: Past Medical History:  Diagnosis Date  . Arthritis     SURGICAL HISTORY: Past Surgical History:  Procedure Laterality Date  . BACK SURGERY      SOCIAL HISTORY: Social History    Socioeconomic History  . Marital status: Married    Spouse name: Shanon Brow  . Number of children: 3  . Years of education: Not on file  . Highest education level: Not on file  Occupational History  . Not on file  Social Needs  . Financial resource strain: Not hard at all  . Food insecurity:    Worry: Never true    Inability: Never true  . Transportation needs:    Medical: No    Non-medical: No  Tobacco Use  . Smoking status: Former Smoker    Packs/day: 1.00    Years: 15.00    Pack years: 15.00    Types: Cigarettes    Last attempt to quit: 03/11/2009    Years since quitting: 10.0  . Smokeless tobacco: Never Used  Substance and Sexual Activity  . Alcohol use: Not Currently  . Drug use: Never  . Sexual activity: Not on file  Lifestyle  . Physical activity:    Days per week: 0 days    Minutes per session: 0 min  . Stress: Only a little  Relationships  . Social connections:    Talks on phone: Once a week    Gets together: Twice a week    Attends religious service: More than 4 times per year    Active member of club or organization: No    Attends meetings of clubs or organizations: Never    Relationship status: Married  . Intimate partner violence:    Fear of current or ex partner: No    Emotionally abused: No    Physically abused: No    Forced sexual  activity: No  Other Topics Concern  . Not on file  Social History Narrative  . Not on file    FAMILY HISTORY Family History  Problem Relation Age of Onset  . Arthritis Mother   . Heart disease Father   . Heart disease Sister   . Arthritis Brother   . Heart disease Sister     ALLERGIES:  has no allergies on file.  MEDICATIONS:  Current Outpatient Medications  Medication Sig Dispense Refill  . vitamin C (ASCORBIC ACID) 500 MG tablet Take 500 mg by mouth daily.    Marland Kitchen ALPRAZolam (XANAX) 0.5 MG tablet TAKE 1 2 TO 1 (ONE HALF TO ONE) TABLET BY MOUTH TWICE DAILY AS NEEDED FOR ANXIETY    . diclofenac sodium  (VOLTAREN) 1 % GEL APPLY 1 GRAM TOPICALLY TWICE DAILY    . ibuprofen (ADVIL) 800 MG tablet TAKE 1 TABLET BY MOUTH 4 TIMES DAILY    . venlafaxine XR (EFFEXOR-XR) 75 MG 24 hr capsule Take 75 mg by mouth daily.     No current facility-administered medications for this visit.     PHYSICAL EXAMINATION:  ECOG PERFORMANCE STATUS: 2 - Symptomatic, <50% confined to bed   Vitals:   03/12/19 1311  BP: 136/70  Pulse: 94  Resp: 18  Temp: 98.5 F (36.9 C)  SpO2: 95%    Filed Weights   03/12/19 1311  Weight: 222 lb 11.2 oz (101 kg)     Physical Exam Constitutional:      Appearance: Normal appearance. She is obese.  HENT:     Head: Normocephalic.     Mouth/Throat:     Mouth: Mucous membranes are moist.     Pharynx: Oropharynx is clear.  Eyes:     Extraocular Movements: Extraocular movements intact.     Pupils: Pupils are equal, round, and reactive to light.  Neck:     Musculoskeletal: Normal range of motion.  Cardiovascular:     Rate and Rhythm: Normal rate and regular rhythm.  Pulmonary:     Effort: Pulmonary effort is normal.     Breath sounds: Normal breath sounds.  Abdominal:     General: Bowel sounds are normal.     Palpations: Abdomen is soft.  Musculoskeletal:     Right shoulder: She exhibits normal range of motion.     Comments: Decrease strength 3/5 RLE, LLE.   Neurological:     Mental Status: She is alert.     Motor: Weakness present.     Gait: Gait abnormal.  Psychiatric:        Mood and Affect: Mood normal.        Behavior: Behavior normal.        Thought Content: Thought content normal.        Judgment: Judgment normal.   There is left axillary adenopathy.  Bilateral breast exam did not reveal any palpable masses.   LABORATORY DATA: I have personally reviewed the data as listed:  Appointment on 03/12/2019  Component Date Value Ref Range Status  . WBC 03/12/2019 10.1  4.0 - 10.5 K/uL Final  . RBC 03/12/2019 3.97  3.87 - 5.11 MIL/uL Final  .  Hemoglobin 03/12/2019 10.6* 12.0 - 15.0 g/dL Final  . HCT 03/12/2019 34.4* 36.0 - 46.0 % Final  . MCV 03/12/2019 86.6  80.0 - 100.0 fL Final  . MCH 03/12/2019 26.7  26.0 - 34.0 pg Final  . MCHC 03/12/2019 30.8  30.0 - 36.0 g/dL Final  . RDW 03/12/2019 13.8  11.5 - 15.5 % Final  . Platelets 03/12/2019 313  150 - 400 K/uL Final  . nRBC 03/12/2019 0.0  0.0 - 0.2 % Final  . Neutrophils Relative % 03/12/2019 67  % Final  . Neutro Abs 03/12/2019 6.7  1.7 - 7.7 K/uL Final  . Lymphocytes Relative 03/12/2019 18  % Final  . Lymphs Abs 03/12/2019 1.8  0.7 - 4.0 K/uL Final  . Monocytes Relative 03/12/2019 5  % Final  . Monocytes Absolute 03/12/2019 0.5  0.1 - 1.0 K/uL Final  . Eosinophils Relative 03/12/2019 0  % Final  . Eosinophils Absolute 03/12/2019 0.0  0.0 - 0.5 K/uL Final  . Basophils Relative 03/12/2019 1  % Final  . Basophils Absolute 03/12/2019 0.1  0.0 - 0.1 K/uL Final  . WBC Morphology 03/12/2019 MILD LEFT SHIFT (1-5% METAS, OCC MYELO, OCC BANDS)   Final  . Immature Granulocytes 03/12/2019 9  % Final  . Abs Immature Granulocytes 03/12/2019 0.95* 0.00 - 0.07 K/uL Final   Performed at [facility], [address], [place], [place] [address]  . Sodium 03/12/2019 140  135 - 145 mmol/L Final  . Potassium 03/12/2019 3.8  3.5 - 5.1 mmol/L Final  . Chloride 03/12/2019 102  98 - 111 mmol/L Final  . CO2 03/12/2019 23  22 - 32 mmol/L Final  . Glucose, Bld 03/12/2019 114* 70 - 99 mg/dL Final  . BUN 03/12/2019 17  6 - 20 mg/dL Final  . Creatinine, Ser 03/12/2019 0.51  0.44 - 1.00 mg/dL Final  . Calcium 03/12/2019 9.7  8.9 - 10.3 mg/dL Final  . Total Protein 03/12/2019 7.2  6.5 - 8.1 g/dL Final  . Albumin 03/12/2019 3.0* 3.5 - 5.0 g/dL Final  . AST 03/12/2019 34  15 - 41 U/L Final  . ALT 03/12/2019 69* 0 - 44 U/L Final  . Alkaline Phosphatase 03/12/2019 310* 38 - 126 U/L Final  . Total Bilirubin 03/12/2019 0.4  0.3 - 1.2 mg/dL Final  . GFR calc non Af Amer 03/12/2019 >60  >60 mL/min Final  .  GFR calc Af Amer 03/12/2019 >60  >60 mL/min Final  . Anion gap 03/12/2019 15  5 - 15 Final   Performed at [facility], [address], [place], [place] [address]    RADIOGRAPHIC STUDIES: I have personally reviewed the radiological images as listed and agree with the findings in the report   ASSESSMENT/PLAN  Lytic lesion of bone on x-ray 1.  Metastatic bone lesions: - Back Pain with BLE weakness (left more than right) aprroxamatley 4 weeks ago. - 03/01/2019: MRI Lumbar spine:  Innumerable bony lesions in the lumbarscaral region.  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  - Concern for Multiple Myeloma versus metastatic disease.Recommend MRI of thoracic spine as well as PET/CT to evaluate for further disease.  - Physical exam revealed 1-2 cm enlarged left axilla LN. Recommend surgical evaluation for possible biopsy. - Will also complete blood work today with multiple myeloma labs. -I will also recommend consultation from neurosurgery for the epidural tumor at L5 level. - RTC after work up to discuss treatment plan.     Orders Placed This Encounter  Procedures  . MR Thoracic Spine W Contrast    Standing Status:   Future    Standing Expiration Date:   03/11/2020    Order Specific Question:   If indicated for the  ordered procedure, I authorize the administration of contrast media per Radiology protocol    Answer:   Yes    Order Specific Question:   What is the patient's sedation requirement?    Answer:   No Sedation    Order Specific Question:   Does the patient have a pacemaker or implanted devices?    Answer:   No    Order Specific Question:   Radiology Contrast Protocol - do NOT remove file path    Answer:   \\[email or web address]    Order Specific Question:   Preferred imaging location?    Answer:   [facility]  (table limit-350lbs)  . NM PET Image Initial (PI) Skull Base To Thigh    Standing Status:   Future    Standing Expiration Date:   03/11/2020    Order Specific Question:   If indicated for the ordered procedure, I authorize the administration of a radiopharmaceutical per Radiology protocol    Answer:   Yes    Order Specific Question:   Is the patient pregnant?    Answer:   No    Order Specific Question:   Preferred imaging location?    Answer:   [facility]    Order Specific Question:   Radiology Contrast Protocol - do NOT remove file path    Answer:   \\[email or web address]  . MR Thoracic Spine W Wo Contrast    Standing Status:   Future    Standing Expiration Date:   03/11/2020    Order Specific Question:   GRA to provide read?    Answer:   Yes    Order Specific Question:   If indicated for the ordered procedure, I authorize the administration of contrast media per Radiology protocol    Answer:   Yes    Order Specific Question:   What is the patient's sedation requirement?    Answer:   No Sedation    Order Specific Question:   Use SRS Protocol?    Answer:   Yes    Order Specific Question:   Does the patient have a pacemaker or implanted devices?    Answer:   No    Order Specific Question:   Preferred imaging location?    Answer:   [facility] (table limit-350lbs)    Order Specific Question:   Radiology Contrast Protocol - do NOT remove file path    Answer:   \\[email or web address]\Radiant\[email or web address].PDF  . CBC with Differential    Standing Status:   Future    Number of Occurrences:   1    Standing Expiration Date:   03/11/2020  . Comprehensive metabolic panel    Standing Status:   Future    Number of Occurrences:   1    Standing Expiration Date:   03/11/2020  . Multiple Myeloma Panel (SPEP&IFE w/QIG)    Standing Status:   Future    Number of Occurrences:   1    Standing Expiration Date:   03/11/2020  . Kappa/lambda light chains    Standing Status:    Future    Number of Occurrences:   1    Standing Expiration Date:   03/11/2020  . Lactate dehydrogenase    Standing Status:   Future    Standing Expiration Date:   03/11/2020    All questions were answered. The patient knows to call the clinic with any problems, questions or concerns.  This note was electronically signed.    Derek Jack, MD  03/12/2019  5:52 PM

## 2019-03-13 LAB — KAPPA/LAMBDA LIGHT CHAINS
Kappa free light chain: 12.2 mg/L (ref 3.3–19.4)
Kappa, lambda light chain ratio: 0.79 (ref 0.26–1.65)
Lambda free light chains: 15.4 mg/L (ref 5.7–26.3)

## 2019-03-15 LAB — MULTIPLE MYELOMA PANEL, SERUM
Albumin SerPl Elph-Mcnc: 2.6 g/dL — ABNORMAL LOW (ref 2.9–4.4)
Albumin/Glob SerPl: 0.8 (ref 0.7–1.7)
Alpha 1: 0.3 g/dL (ref 0.0–0.4)
Alpha2 Glob SerPl Elph-Mcnc: 1.7 g/dL — ABNORMAL HIGH (ref 0.4–1.0)
B-Globulin SerPl Elph-Mcnc: 1 g/dL (ref 0.7–1.3)
Gamma Glob SerPl Elph-Mcnc: 0.7 g/dL (ref 0.4–1.8)
Globulin, Total: 3.7 g/dL (ref 2.2–3.9)
IgA: 148 mg/dL (ref 87–352)
IgG (Immunoglobin G), Serum: 725 mg/dL (ref 586–1602)
IgM (Immunoglobulin M), Srm: 123 mg/dL (ref 26–217)
Total Protein ELP: 6.3 g/dL (ref 6.0–8.5)

## 2019-03-19 ENCOUNTER — Ambulatory Visit: Payer: BC Managed Care – PPO | Admitting: General Surgery

## 2019-03-19 ENCOUNTER — Encounter: Payer: Self-pay | Admitting: General Surgery

## 2019-03-19 ENCOUNTER — Other Ambulatory Visit: Payer: Self-pay

## 2019-03-19 VITALS — BP 143/87 | HR 108 | Temp 98.0°F | Resp 18 | Ht 66.0 in | Wt 223.0 lb

## 2019-03-19 DIAGNOSIS — R590 Localized enlarged lymph nodes: Secondary | ICD-10-CM | POA: Diagnosis not present

## 2019-03-19 NOTE — Progress Notes (Signed)
Brandi Rodriguez; [id number]; 1961-09-10   HPI Brandi Rodriguez is a 58 year old white female who was referred to my care by Dr. Delton Coombes for evaluation treatment of left axillary lymphadenopathy.  Brandi Rodriguez is currently undergoing cancer work-up for multiple lytic lesions of the bone and was found to have left axillary lymphadenopathy.  He has requested a biopsy of this region.  Brandi Rodriguez has 10 out of 10 pain throughout her body.  She does have difficulty ambulating. Past Medical History:  Diagnosis Date  . Arthritis     Past Surgical History:  Procedure Laterality Date  . BACK SURGERY      Family History  Problem Relation Age of Onset  . Arthritis Mother   . Heart disease Father   . Heart disease Sister   . Arthritis Brother   . Heart disease Sister     Current Outpatient Medications on File Prior to Visit  Medication Sig Dispense Refill  . ALPRAZolam (XANAX) 0.5 MG tablet TAKE 1 2 TO 1 (ONE HALF TO ONE) TABLET BY MOUTH TWICE DAILY AS NEEDED FOR ANXIETY    . ibuprofen (ADVIL) 800 MG tablet TAKE 1 TABLET BY MOUTH 4 TIMES DAILY    . venlafaxine XR (EFFEXOR-XR) 75 MG 24 hr capsule Take 75 mg by mouth daily.    . vitamin C (ASCORBIC ACID) 500 MG tablet Take 500 mg by mouth daily.    . diclofenac sodium (VOLTAREN) 1 % GEL APPLY 1 GRAM TOPICALLY TWICE DAILY     No current facility-administered medications on file prior to visit.     Not on File  Social History   Substance and Sexual Activity  Alcohol Use Not Currently    Social History   Tobacco Use  Smoking Status Former Smoker  . Packs/day: 1.00  . Years: 15.00  . Pack years: 15.00  . Types: Cigarettes  . Last attempt to quit: 03/11/2009  . Years since quitting: 10.0  Smokeless Tobacco Never Used    Review of Systems  Constitutional: Positive for malaise/fatigue.  HENT: Negative.   Eyes: Positive for blurred vision.  Respiratory: Negative.   Cardiovascular: Negative.   Gastrointestinal: Positive for abdominal pain.   Genitourinary: Positive for frequency.  Musculoskeletal: Positive for back pain and joint pain.  Skin: Negative.   Neurological: Negative.   Endo/Heme/Allergies: Negative.   Psychiatric/Behavioral: Negative.     Objective   Vitals:   03/19/19 1353  BP: (!) 143/87  Pulse: (!) 108  Resp: 18  Temp: 98 F (36.7 C)  SpO2: 92%    Physical Exam Vitals signs reviewed.  Constitutional:      Appearance: Normal appearance. She is obese. She is not ill-appearing.  HENT:     Head: Normocephalic and atraumatic.  Cardiovascular:     Rate and Rhythm: Regular rhythm. Tachycardia present.     Heart sounds: Normal heart sounds. No murmur. No friction rub. No gallop.   Pulmonary:     Effort: Pulmonary effort is normal. No respiratory distress.     Breath sounds: Normal breath sounds. No stridor. No wheezing, rhonchi or rales.  Skin:    General: Skin is warm and dry.  Neurological:     Mental Status: She is alert and oriented to person, place, and time.   Left axilla with 2 palpable enlarged lymph nodes present. Dr. Tomie China notes reviewed  Assessment  Left axillary lymphadenopathy, currently undergoing cancer work-up for lytic lesions Plan   Brandi Rodriguez is scheduled for left axillary lymph node biopsy on 03/25/2019.  The risks  and benefits of the procedure including bleeding and infection were fully explained to the Brandi Rodriguez, who gave informed consent.

## 2019-03-19 NOTE — H&P (Signed)
Brandi Rodriguez; [id number]; June 16, 1961   HPI Patient is a 58 year old white female who was referred to my care by Dr. Delton Coombes for evaluation treatment of left axillary lymphadenopathy.  Patient is currently undergoing cancer work-up for multiple lytic lesions of the bone and was found to have left axillary lymphadenopathy.  He has requested a biopsy of this region.  Patient has 10 out of 10 pain throughout her body.  She does have difficulty ambulating. Past Medical History:  Diagnosis Date  . Arthritis     Past Surgical History:  Procedure Laterality Date  . BACK SURGERY      Family History  Problem Relation Age of Onset  . Arthritis Mother   . Heart disease Father   . Heart disease Sister   . Arthritis Brother   . Heart disease Sister     Current Outpatient Medications on File Prior to Visit  Medication Sig Dispense Refill  . ALPRAZolam (XANAX) 0.5 MG tablet TAKE 1 2 TO 1 (ONE HALF TO ONE) TABLET BY MOUTH TWICE DAILY AS NEEDED FOR ANXIETY    . ibuprofen (ADVIL) 800 MG tablet TAKE 1 TABLET BY MOUTH 4 TIMES DAILY    . venlafaxine XR (EFFEXOR-XR) 75 MG 24 hr capsule Take 75 mg by mouth daily.    . vitamin C (ASCORBIC ACID) 500 MG tablet Take 500 mg by mouth daily.    . diclofenac sodium (VOLTAREN) 1 % GEL APPLY 1 GRAM TOPICALLY TWICE DAILY     No current facility-administered medications on file prior to visit.     Not on File  Social History   Substance and Sexual Activity  Alcohol Use Not Currently    Social History   Tobacco Use  Smoking Status Former Smoker  . Packs/day: 1.00  . Years: 15.00  . Pack years: 15.00  . Types: Cigarettes  . Last attempt to quit: 03/11/2009  . Years since quitting: 10.0  Smokeless Tobacco Never Used    Review of Systems  Constitutional: Positive for malaise/fatigue.  HENT: Negative.   Eyes: Positive for blurred vision.  Respiratory: Negative.   Cardiovascular: Negative.   Gastrointestinal: Positive for abdominal pain.   Genitourinary: Positive for frequency.  Musculoskeletal: Positive for back pain and joint pain.  Skin: Negative.   Neurological: Negative.   Endo/Heme/Allergies: Negative.   Psychiatric/Behavioral: Negative.     Objective   Vitals:   03/19/19 1353  BP: (!) 143/87  Pulse: (!) 108  Resp: 18  Temp: 98 F (36.7 C)  SpO2: 92%    Physical Exam Vitals signs reviewed.  Constitutional:      Appearance: Normal appearance. She is obese. She is not ill-appearing.  HENT:     Head: Normocephalic and atraumatic.  Cardiovascular:     Rate and Rhythm: Regular rhythm. Tachycardia present.     Heart sounds: Normal heart sounds. No murmur. No friction rub. No gallop.   Pulmonary:     Effort: Pulmonary effort is normal. No respiratory distress.     Breath sounds: Normal breath sounds. No stridor. No wheezing, rhonchi or rales.  Skin:    General: Skin is warm and dry.  Neurological:     Mental Status: She is alert and oriented to person, place, and time.   Left axilla with 2 palpable enlarged lymph nodes present. Dr. Tomie China notes reviewed  Assessment  Left axillary lymphadenopathy, currently undergoing cancer work-up for lytic lesions Plan   Patient is scheduled for left axillary lymph node biopsy on 03/25/2019.  The risks  and benefits of the procedure including bleeding and infection were fully explained to the patient, who gave informed consent.

## 2019-03-20 ENCOUNTER — Ambulatory Visit (HOSPITAL_COMMUNITY): Payer: BLUE CROSS/BLUE SHIELD

## 2019-03-21 ENCOUNTER — Ambulatory Visit (HOSPITAL_COMMUNITY): Payer: BC Managed Care – PPO

## 2019-03-21 ENCOUNTER — Other Ambulatory Visit (HOSPITAL_COMMUNITY)
Admission: RE | Admit: 2019-03-21 | Discharge: 2019-03-21 | Disposition: A | Discharge status: Home / Self Care | Payer: BC Managed Care – PPO | Source: Ambulatory Visit | Attending: General Surgery | Admitting: General Surgery

## 2019-03-21 ENCOUNTER — Other Ambulatory Visit: Payer: Self-pay | Admitting: Radiation Therapy

## 2019-03-21 ENCOUNTER — Other Ambulatory Visit: Payer: Self-pay

## 2019-03-21 ENCOUNTER — Encounter (HOSPITAL_COMMUNITY): Payer: Self-pay

## 2019-03-21 DIAGNOSIS — M899 Disorder of bone, unspecified: Secondary | ICD-10-CM | POA: Diagnosis not present

## 2019-03-21 DIAGNOSIS — Z1159 Encounter for screening for other viral diseases: Secondary | ICD-10-CM | POA: Diagnosis not present

## 2019-03-21 DIAGNOSIS — M5416 Radiculopathy, lumbar region: Secondary | ICD-10-CM | POA: Diagnosis not present

## 2019-03-22 ENCOUNTER — Encounter (HOSPITAL_COMMUNITY)
Admission: RE | Admit: 2019-03-22 | Discharge: 2019-03-22 | Disposition: A | Discharge status: Home / Self Care | Payer: BC Managed Care – PPO | Source: Ambulatory Visit | Attending: General Surgery | Admitting: General Surgery

## 2019-03-22 ENCOUNTER — Encounter: Payer: Self-pay | Admitting: Emergency Medicine

## 2019-03-22 LAB — NOVEL CORONAVIRUS, NAA (HOSP ORDER, SEND-OUT TO REF LAB; TAT 18-24 HRS): SARS-CoV-2, NAA: NOT DETECTED

## 2019-03-25 ENCOUNTER — Ambulatory Visit (HOSPITAL_COMMUNITY)
Admission: RE | Admit: 2019-03-25 | Discharge: 2019-03-25 | Disposition: A | Discharge status: Home / Self Care | Payer: BC Managed Care – PPO | Attending: General Surgery | Admitting: General Surgery

## 2019-03-25 ENCOUNTER — Other Ambulatory Visit: Payer: Self-pay

## 2019-03-25 ENCOUNTER — Ambulatory Visit (HOSPITAL_COMMUNITY): Payer: BC Managed Care – PPO | Admitting: Anesthesiology

## 2019-03-25 ENCOUNTER — Encounter (HOSPITAL_COMMUNITY): Payer: Self-pay | Admitting: *Deleted

## 2019-03-25 ENCOUNTER — Ambulatory Visit (HOSPITAL_COMMUNITY): Payer: BLUE CROSS/BLUE SHIELD

## 2019-03-25 ENCOUNTER — Encounter (HOSPITAL_COMMUNITY)
Admission: RE | Disposition: A | Discharge status: Home / Self Care | Payer: Self-pay | Source: Home / Self Care | Attending: General Surgery

## 2019-03-25 DIAGNOSIS — F419 Anxiety disorder, unspecified: Secondary | ICD-10-CM | POA: Insufficient documentation

## 2019-03-25 DIAGNOSIS — Z8261 Family history of arthritis: Secondary | ICD-10-CM | POA: Insufficient documentation

## 2019-03-25 DIAGNOSIS — C801 Malignant (primary) neoplasm, unspecified: Secondary | ICD-10-CM | POA: Insufficient documentation

## 2019-03-25 DIAGNOSIS — R591 Generalized enlarged lymph nodes: Secondary | ICD-10-CM | POA: Diagnosis present

## 2019-03-25 DIAGNOSIS — F17210 Nicotine dependence, cigarettes, uncomplicated: Secondary | ICD-10-CM | POA: Diagnosis not present

## 2019-03-25 DIAGNOSIS — R590 Localized enlarged lymph nodes: Secondary | ICD-10-CM

## 2019-03-25 DIAGNOSIS — Z8249 Family history of ischemic heart disease and other diseases of the circulatory system: Secondary | ICD-10-CM | POA: Insufficient documentation

## 2019-03-25 DIAGNOSIS — C773 Secondary and unspecified malignant neoplasm of axilla and upper limb lymph nodes: Secondary | ICD-10-CM | POA: Diagnosis not present

## 2019-03-25 DIAGNOSIS — M1990 Unspecified osteoarthritis, unspecified site: Secondary | ICD-10-CM | POA: Diagnosis not present

## 2019-03-25 DIAGNOSIS — Z79899 Other long term (current) drug therapy: Secondary | ICD-10-CM | POA: Insufficient documentation

## 2019-03-25 DIAGNOSIS — Z791 Long term (current) use of non-steroidal anti-inflammatories (NSAID): Secondary | ICD-10-CM | POA: Diagnosis not present

## 2019-03-25 DIAGNOSIS — C9000 Multiple myeloma not having achieved remission: Secondary | ICD-10-CM | POA: Diagnosis not present

## 2019-03-25 HISTORY — PX: AXILLARY LYMPH NODE BIOPSY: SHX5737

## 2019-03-25 SURGERY — AXILLARY LYMPH NODE BIOPSY
Anesthesia: General | Site: Axilla | Laterality: Left

## 2019-03-25 MED ORDER — CHLORHEXIDINE GLUCONATE CLOTH 2 % EX PADS: 6.0000 | MEDICATED_PAD | Freq: Once | CUTANEOUS | Status: DC

## 2019-03-25 MED ORDER — LACTATED RINGERS IV SOLN
INTRAVENOUS | Status: DC
  Administered 2019-03-25: 12:00:00 via INTRAVENOUS

## 2019-03-25 MED ORDER — PROMETHAZINE HCL 25 MG/ML IJ SOLN: 6.2500 mg | INTRAMUSCULAR | Status: DC | PRN

## 2019-03-25 MED ORDER — PROPOFOL 10 MG/ML IV BOLUS
INTRAVENOUS | Status: DC | PRN
  Administered 2019-03-25: 200 mg via INTRAVENOUS

## 2019-03-25 MED ORDER — CEFAZOLIN SODIUM-DEXTROSE 2-4 GM/100ML-% IV SOLN
2.0000 g | INTRAVENOUS | Status: AC
  Administered 2019-03-25: 2 g via INTRAVENOUS

## 2019-03-25 MED ORDER — 0.9 % SODIUM CHLORIDE (POUR BTL) OPTIME
TOPICAL | Status: DC | PRN
  Administered 2019-03-25: 1000 mL

## 2019-03-25 MED ORDER — MIDAZOLAM HCL 2 MG/2ML IJ SOLN: 0.5000 mg | Freq: Once | INTRAMUSCULAR | Status: DC | PRN

## 2019-03-25 MED ORDER — TRAMADOL HCL 50 MG PO TABS: 50.0000 mg | ORAL_TABLET | Freq: Four times a day (QID) | ORAL | 0 refills | Status: DC | PRN

## 2019-03-25 MED ORDER — FENTANYL CITRATE (PF) 100 MCG/2ML IJ SOLN
INTRAMUSCULAR | Status: DC | PRN
  Administered 2019-03-25 (×3): 50 ug via INTRAVENOUS

## 2019-03-25 MED ORDER — MIDAZOLAM HCL 5 MG/5ML IJ SOLN
INTRAMUSCULAR | Status: DC | PRN
  Administered 2019-03-25: 2 mg via INTRAVENOUS

## 2019-03-25 MED ORDER — SUCCINYLCHOLINE 20MG/ML (10ML) SYRINGE FOR MEDFUSION PUMP - OPTIME
INTRAMUSCULAR | Status: DC | PRN
  Administered 2019-03-25: 140 mg via INTRAVENOUS

## 2019-03-25 MED ORDER — BUPIVACAINE HCL (PF) 0.5 % IJ SOLN
INTRAMUSCULAR | Status: DC | PRN
  Administered 2019-03-25: 7 mL

## 2019-03-25 MED ORDER — KETOROLAC TROMETHAMINE 30 MG/ML IJ SOLN
30.0000 mg | Freq: Once | INTRAMUSCULAR | Status: AC
  Administered 2019-03-25: 30 mg via INTRAVENOUS

## 2019-03-25 SURGICAL SUPPLY — 36 items
ADH SKN CLS APL DERMABOND .7 (GAUZE/BANDAGES/DRESSINGS) ×1
APL PRP STRL LF DISP 70% ISPRP (MISCELLANEOUS) ×1
APPLIER CLIP 13 LRG OPEN (CLIP) ×2
APR CLP LRG 13 20 CLIP (CLIP) ×1
BLADE SURG 15 STRL LF DISP TIS (BLADE) ×1 IMPLANT
BLADE SURG 15 STRL SS (BLADE) ×2
CHLORAPREP W/TINT 26 (MISCELLANEOUS) ×2 IMPLANT
CLIP APPLIE 13 LRG OPEN (CLIP) ×1 IMPLANT
CLOTH BEACON ORANGE TIMEOUT ST (SAFETY) ×2 IMPLANT
CONT SPEC 4OZ CLIKSEAL STRL BL (MISCELLANEOUS) ×2 IMPLANT
COVER LIGHT HANDLE STERIS (MISCELLANEOUS) ×4 IMPLANT
COVER WAND RF STERILE (DRAPES) ×2 IMPLANT
DECANTER SPIKE VIAL GLASS SM (MISCELLANEOUS) ×2 IMPLANT
DERMABOND ADVANCED (GAUZE/BANDAGES/DRESSINGS) ×1
DERMABOND ADVANCED .7 DNX12 (GAUZE/BANDAGES/DRESSINGS) ×1 IMPLANT
ELECT REM PT RETURN 9FT ADLT (ELECTROSURGICAL) ×2
ELECTRODE REM PT RTRN 9FT ADLT (ELECTROSURGICAL) ×1 IMPLANT
GLOVE BIOGEL PI IND STRL 7.0 (GLOVE) ×2 IMPLANT
GLOVE BIOGEL PI INDICATOR 7.0 (GLOVE) ×2
GLOVE ECLIPSE 6.5 STRL STRAW (GLOVE) ×2 IMPLANT
GLOVE SURG SS PI 7.5 STRL IVOR (GLOVE) ×2 IMPLANT
GOWN STRL REUS W/TWL LRG LVL3 (GOWN DISPOSABLE) ×4 IMPLANT
INST SET MINOR GENERAL (KITS) ×2 IMPLANT
KIT TURNOVER KIT A (KITS) ×2 IMPLANT
MANIFOLD NEPTUNE II (INSTRUMENTS) ×2 IMPLANT
NEEDLE HYPO 25X1 1.5 SAFETY (NEEDLE) ×2 IMPLANT
NS IRRIG 1000ML POUR BTL (IV SOLUTION) ×2 IMPLANT
PACK MINOR (CUSTOM PROCEDURE TRAY) ×2 IMPLANT
PAD ARMBOARD 7.5X6 YLW CONV (MISCELLANEOUS) ×2 IMPLANT
SET BASIN LINEN APH (SET/KITS/TRAYS/PACK) ×2 IMPLANT
SPONGE INTESTINAL PEANUT (DISPOSABLE) ×2 IMPLANT
SPONGE LAP 18X18 RF (DISPOSABLE) ×2 IMPLANT
SUT MNCRL AB 4-0 PS2 18 (SUTURE) ×2 IMPLANT
SUT VIC AB 3-0 SH 27 (SUTURE) ×2
SUT VIC AB 3-0 SH 27X BRD (SUTURE) ×1 IMPLANT
SYR CONTROL 10ML LL (SYRINGE) ×2 IMPLANT

## 2019-03-25 NOTE — Anesthesia Postprocedure Evaluation (Signed)
Anesthesia Post [id number] Patient: Brandi Rodriguez  Procedure(s) Performed: LEFT AXILLARY LYMPH NODE BIOPSY (Left Axilla)  Patient location during evaluation: PACU Anesthesia Type: General Level of consciousness: awake and alert and oriented Pain management: pain level controlled Vital Signs Assessment: post-procedure vital signs reviewed and stable Respiratory status: spontaneous breathing Cardiovascular status: stable Anesthetic complications: no     Last Vitals:  Vitals:   03/25/19 1430 03/25/19 1437  BP: (!) 172/91 (!) 169/67  Pulse: 100 (!) 112  Resp: (!) 26 (!) 21  Temp:  36.6 C  SpO2: 91% 94%    Last Pain:  Vitals:   03/25/19 1437  TempSrc: Oral  PainSc: 6                  ADAMS, AMY A

## 2019-03-25 NOTE — Anesthesia Procedure Notes (Signed)
Procedure Name: Intubation Date/Time: 03/25/2019 1:19 PM Performed by: Ollen Bowl, CRNA Pre-anesthesia Checklist: Patient identified, Patient being monitored, Timeout performed, Emergency Drugs available and Suction available Patient Re-evaluated:Patient Re-evaluated prior to induction Oxygen Delivery Method: Circle system utilized Preoxygenation: Pre-oxygenation with 100% oxygen Induction Type: IV induction Ventilation: Mask ventilation without difficulty Laryngoscope Size: Mac and 3 Grade View: Grade I Tube type: Oral Tube size: 7.0 mm Number of attempts: 1 Airway Equipment and Method: Stylet Placement Confirmation: ETT inserted through vocal cords under direct vision,  positive ETCO2 and breath sounds checked- equal and bilateral Secured at: 21 cm Tube secured with: Tape Dental Injury: Teeth and Oropharynx as per pre-operative assessment

## 2019-03-25 NOTE — Interval H&P Note (Signed)
History and Physical Interval Note:  03/25/2019 12:06 PM  Brandi Rodriguez  has presented today for surgery, with the diagnosis of lymphadenophathy.  The various methods of treatment have been discussed with the patient and family. After consideration of risks, benefits and other options for treatment, the patient has consented to  Procedure(s): AXILLARY LYMPH NODE BIOPSY - DEEP (Left) as a surgical intervention.  The patient's history has been reviewed, patient examined, no change in status, stable for surgery.  I have reviewed the patient's chart and labs.  Questions were answered to the patient's satisfaction.     Aviva Signs

## 2019-03-25 NOTE — Anesthesia Preprocedure Evaluation (Signed)
Anesthesia Evaluation    Airway Mallampati: II  TM Distance: >3 FB Neck ROM: Full    Dental no notable dental hx.    Pulmonary former smoker,    Pulmonary exam normal breath sounds clear to auscultation       Cardiovascular Exercise Tolerance: Poor Normal cardiovascular examII Rhythm:Regular Rate:Normal  Denies MI/CP/DOE States limited ET due to LBP/pain in sides    Neuro/Psych Anxiety    GI/Hepatic   Endo/Other    Renal/GU      Musculoskeletal  (+) Arthritis , Osteoarthritis,    Abdominal   Peds  Hematology   Anesthesia Other Findings   Reproductive/Obstetrics                             Anesthesia Physical Anesthesia Plan  ASA: III  Anesthesia Plan: General   Post-op Pain Management:    Induction: Intravenous  PONV Risk Score and Plan:   Airway Management Planned: LMA and Oral ETT  Additional Equipment:   Intra-op Plan:   Post-operative Plan: Extubation in OR  Informed Consent: I have reviewed the patients History and Physical, chart, labs and discussed the procedure including the risks, benefits and alternatives for the proposed anesthesia with the patient or authorized representative who has indicated his/her understanding and acceptance.     Dental advisory given  Plan Discussed with: CRNA  Anesthesia Plan Comments: (Plan full PPE use  Plan GA LMA vs ETT as needed  WTP same )        Anesthesia Quick Evaluation

## 2019-03-25 NOTE — Discharge Instructions (Signed)
General Anesthesia, Adult, Care After  This sheet gives you information about how to care for yourself after your procedure. Your health care provider may also give you more specific instructions. If you have problems or questions, contact your health care provider.  What can I expect after the procedure?  After the procedure, the following side effects are common:  Pain or discomfort at the IV site.  Nausea.  Vomiting.  Sore throat.  Trouble concentrating.  Feeling cold or chills.  Weak or tired.  Sleepiness and fatigue.  Soreness and body aches. These side effects can affect parts of the body that were not involved in surgery.  Follow these instructions at home:    For at least 24 hours after the procedure:  Have a responsible adult stay with you. It is important to have someone help care for you until you are awake and alert.  Rest as needed.  Do not:  Participate in activities in which you could fall or become injured.  Drive.  Use heavy machinery.  Drink alcohol.  Take sleeping pills or medicines that cause drowsiness.  Make important decisions or sign legal documents.  Take care of children on your own.  Eating and drinking  Follow any instructions from your health care provider about eating or drinking restrictions.  When you feel hungry, start by eating small amounts of foods that are soft and easy to digest (bland), such as toast. Gradually return to your regular diet.  Drink enough fluid to keep your urine pale yellow.  If you vomit, rehydrate by drinking water, juice, or clear broth.  General instructions  If you have sleep apnea, surgery and certain medicines can increase your risk for breathing problems. Follow instructions from your health care provider about wearing your sleep device:  Anytime you are sleeping, including during daytime naps.  While taking prescription pain medicines, sleeping medicines, or medicines that make you drowsy.  Return to your normal activities as told by your health care  provider. Ask your health care provider what activities are safe for you.  Take over-the-counter and prescription medicines only as told by your health care provider.  If you smoke, do not smoke without supervision.  Keep all follow-up visits as told by your health care provider. This is important.  Contact a health care provider if:  You have nausea or vomiting that does not get better with medicine.  You cannot eat or drink without vomiting.  You have pain that does not get better with medicine.  You are unable to pass urine.  You develop a skin rash.  You have a fever.  You have redness around your IV site that gets worse.  Get help right away if:  You have difficulty breathing.  You have chest pain.  You have blood in your urine or stool, or you vomit blood.  Summary  After the procedure, it is common to have a sore throat or nausea. It is also common to feel tired.  Have a responsible adult stay with you for the first 24 hours after general anesthesia. It is important to have someone help care for you until you are awake and alert.  When you feel hungry, start by eating small amounts of foods that are soft and easy to digest (bland), such as toast. Gradually return to your regular diet.  Drink enough fluid to keep your urine pale yellow.  Return to your normal activities as told by your health care provider. Ask your health care   provider what activities are safe for you.  This information is not intended to replace advice given to you by your health care provider. Make sure you discuss any questions you have with your health care provider.  Document Released: 01/09/2001 Document Revised: 05/19/2017 Document Reviewed: 05/19/2017  Elsevier Interactive Patient Education  2019 [employer]

## 2019-03-25 NOTE — Op Note (Signed)
Patient:  Brandi Rodriguez  DOB:  1961/06/07  MRN:  [id number]   Preop Diagnosis: Metastatic disease, left axillary lymphadenopathy  Postop Diagnosis: Same  Procedure: Left axillary lymph node biopsy  Surgeon: Aviva Signs, MD  Anes: General  Indications: Patient is a 58 year old white female who was referred to my care by oncology for left axillary lymph node biopsy.  She has evidence of metastatic disease or multiple myeloma.  The risks and benefits of the procedure including bleeding and infection were fully explained to the patient, who gave informed consent.  Procedure note: The patient was placed in the supine position.  After general anesthesia was administered, the left axilla was prepped and draped using the usual sterile technique with ChloraPrep.  Surgical site confirmation was performed.  An incision was made over a palpable lymph node in the left axilla.  The dissection was taken down to the mass.  The mass measured approximately 2 cm in its greatest diameter.  It did appear abnormal.  Large clips were used to ligate any vessels.  The specimen was removed and sent to pathology further examination.  No abnormal bleeding was noted at the end of the procedure.  0.5% Sensorcaine was instilled into the surrounding wound.  The subcutaneous layer was reapproximated using a 3-0 Vicryl interrupted suture.  The skin was closed using a 4-0 Monocryl subcuticular suture.  Dermabond was applied.  All tape and needle counts were correct at the end of the procedure.  The patient was awakened and transferred to PACU in stable condition.  Complications: None  EBL: Minimal  Specimen: Left axillary lymph node

## 2019-03-25 NOTE — Transfer of Care (Signed)
Immediate Anesthesia Transfer of Care Note  Patient: Brandi Rodriguez  Procedure(s) Performed: LEFT AXILLARY LYMPH NODE BIOPSY (Left Axilla)  Patient Location: PACU  Anesthesia Type:General  Level of Consciousness: awake, alert  and oriented  Airway & Oxygen Therapy: Patient Spontanous Breathing  Post-op Assessment: Report given to RN  Post vital signs: Reviewed and stable  Last Vitals:  Vitals Value Taken Time  BP 127/63 03/25/2019  2:00 PM  Temp    Pulse 108 03/25/2019  2:03 PM  Resp 24 03/25/2019  2:03 PM  SpO2 100 % 03/25/2019  2:03 PM  Vitals shown include unvalidated device data.  Last Pain:  Vitals:   03/25/19 1052  TempSrc: Oral  PainSc: 9          Complications: No apparent anesthesia complications

## 2019-03-26 ENCOUNTER — Encounter (HOSPITAL_COMMUNITY): Payer: Self-pay | Admitting: General Surgery

## 2019-03-26 ENCOUNTER — Ambulatory Visit (HOSPITAL_COMMUNITY): Payer: BLUE CROSS/BLUE SHIELD | Admitting: Hematology

## 2019-03-26 ENCOUNTER — Other Ambulatory Visit: Payer: Self-pay

## 2019-03-27 ENCOUNTER — Inpatient Hospital Stay (HOSPITAL_COMMUNITY): Payer: BC Managed Care – PPO | Attending: Hematology | Admitting: Hematology

## 2019-03-27 ENCOUNTER — Ambulatory Visit (HOSPITAL_COMMUNITY)
Admission: RE | Admit: 2019-03-27 | Discharge: 2019-03-27 | Disposition: A | Discharge status: Home / Self Care | Payer: BC Managed Care – PPO | Source: Ambulatory Visit | Attending: Hematology | Admitting: Hematology

## 2019-03-27 ENCOUNTER — Encounter (HOSPITAL_COMMUNITY): Payer: Self-pay | Admitting: Hematology

## 2019-03-27 ENCOUNTER — Ambulatory Visit: Admit: 2019-03-27 | Payer: BC Managed Care – PPO | Admitting: Radiation Oncology

## 2019-03-27 ENCOUNTER — Inpatient Hospital Stay (HOSPITAL_COMMUNITY): Payer: BC Managed Care – PPO

## 2019-03-27 ENCOUNTER — Encounter (HOSPITAL_COMMUNITY): Payer: Self-pay

## 2019-03-27 ENCOUNTER — Inpatient Hospital Stay (HOSPITAL_COMMUNITY)
Admission: EM | Admit: 2019-03-27 | Discharge: 2019-03-29 | DRG: 543 | Disposition: A | Discharge status: Home Health | Payer: BC Managed Care – PPO | Source: Ambulatory Visit | Attending: Internal Medicine | Admitting: Internal Medicine

## 2019-03-27 ENCOUNTER — Other Ambulatory Visit: Payer: Self-pay

## 2019-03-27 VITALS — BP 132/64 | HR 110 | Temp 99.0°F | Resp 18 | Wt 222.0 lb

## 2019-03-27 DIAGNOSIS — Z885 Allergy status to narcotic agent status: Secondary | ICD-10-CM | POA: Diagnosis not present

## 2019-03-27 DIAGNOSIS — R0902 Hypoxemia: Secondary | ICD-10-CM | POA: Diagnosis present

## 2019-03-27 DIAGNOSIS — C7951 Secondary malignant neoplasm of bone: Secondary | ICD-10-CM

## 2019-03-27 DIAGNOSIS — C801 Malignant (primary) neoplasm, unspecified: Secondary | ICD-10-CM | POA: Diagnosis present

## 2019-03-27 DIAGNOSIS — Z8261 Family history of arthritis: Secondary | ICD-10-CM | POA: Diagnosis not present

## 2019-03-27 DIAGNOSIS — R600 Localized edema: Secondary | ICD-10-CM | POA: Diagnosis present

## 2019-03-27 DIAGNOSIS — W19XXXA Unspecified fall, initial encounter: Secondary | ICD-10-CM | POA: Diagnosis present

## 2019-03-27 DIAGNOSIS — J90 Pleural effusion, not elsewhere classified: Secondary | ICD-10-CM | POA: Diagnosis present

## 2019-03-27 DIAGNOSIS — R339 Retention of urine, unspecified: Secondary | ICD-10-CM | POA: Diagnosis present

## 2019-03-27 DIAGNOSIS — I10 Essential (primary) hypertension: Secondary | ICD-10-CM | POA: Diagnosis present

## 2019-03-27 DIAGNOSIS — Z87891 Personal history of nicotine dependence: Secondary | ICD-10-CM

## 2019-03-27 DIAGNOSIS — Z79899 Other long term (current) drug therapy: Secondary | ICD-10-CM | POA: Diagnosis not present

## 2019-03-27 DIAGNOSIS — Z1159 Encounter for screening for other viral diseases: Secondary | ICD-10-CM

## 2019-03-27 DIAGNOSIS — E860 Dehydration: Secondary | ICD-10-CM | POA: Diagnosis present

## 2019-03-27 DIAGNOSIS — Z79891 Long term (current) use of opiate analgesic: Secondary | ICD-10-CM

## 2019-03-27 DIAGNOSIS — Z8249 Family history of ischemic heart disease and other diseases of the circulatory system: Secondary | ICD-10-CM

## 2019-03-27 DIAGNOSIS — M899 Disorder of bone, unspecified: Secondary | ICD-10-CM

## 2019-03-27 DIAGNOSIS — Z9181 History of falling: Secondary | ICD-10-CM | POA: Diagnosis not present

## 2019-03-27 DIAGNOSIS — M1990 Unspecified osteoarthritis, unspecified site: Secondary | ICD-10-CM | POA: Diagnosis present

## 2019-03-27 DIAGNOSIS — G902 Horner's syndrome: Secondary | ICD-10-CM | POA: Diagnosis present

## 2019-03-27 DIAGNOSIS — M7989 Other specified soft tissue disorders: Secondary | ICD-10-CM | POA: Diagnosis not present

## 2019-03-27 DIAGNOSIS — R531 Weakness: Secondary | ICD-10-CM

## 2019-03-27 DIAGNOSIS — Z791 Long term (current) use of non-steroidal anti-inflammatories (NSAID): Secondary | ICD-10-CM

## 2019-03-27 DIAGNOSIS — H02401 Unspecified ptosis of right eyelid: Secondary | ICD-10-CM | POA: Diagnosis present

## 2019-03-27 DIAGNOSIS — R222 Localized swelling, mass and lump, trunk: Secondary | ICD-10-CM | POA: Diagnosis not present

## 2019-03-27 DIAGNOSIS — Z03818 Encounter for observation for suspected exposure to other biological agents ruled out: Secondary | ICD-10-CM | POA: Diagnosis not present

## 2019-03-27 DIAGNOSIS — R000 Tachycardia, unspecified: Secondary | ICD-10-CM | POA: Diagnosis not present

## 2019-03-27 DIAGNOSIS — D492 Neoplasm of unspecified behavior of bone, soft tissue, and skin: Secondary | ICD-10-CM | POA: Diagnosis not present

## 2019-03-27 DIAGNOSIS — C3432 Malignant neoplasm of lower lobe, left bronchus or lung: Secondary | ICD-10-CM | POA: Diagnosis not present

## 2019-03-27 DIAGNOSIS — D497 Neoplasm of unspecified behavior of endocrine glands and other parts of nervous system: Secondary | ICD-10-CM | POA: Diagnosis not present

## 2019-03-27 DIAGNOSIS — C7931 Secondary malignant neoplasm of brain: Secondary | ICD-10-CM | POA: Diagnosis not present

## 2019-03-27 DIAGNOSIS — C7949 Secondary malignant neoplasm of other parts of nervous system: Secondary | ICD-10-CM | POA: Diagnosis not present

## 2019-03-27 DIAGNOSIS — M8458XA Pathological fracture in neoplastic disease, other specified site, initial encounter for fracture: Secondary | ICD-10-CM | POA: Diagnosis not present

## 2019-03-27 DIAGNOSIS — R590 Localized enlarged lymph nodes: Secondary | ICD-10-CM | POA: Diagnosis not present

## 2019-03-27 DIAGNOSIS — R296 Repeated falls: Secondary | ICD-10-CM | POA: Diagnosis present

## 2019-03-27 LAB — CBC
HCT: 33.9 % — ABNORMAL LOW (ref 36.0–46.0)
Hemoglobin: 10 g/dL — ABNORMAL LOW (ref 12.0–15.0)
MCH: 25.8 pg — ABNORMAL LOW (ref 26.0–34.0)
MCHC: 29.5 g/dL — ABNORMAL LOW (ref 30.0–36.0)
MCV: 87.6 fL (ref 80.0–100.0)
Platelets: 197 10*3/uL (ref 150–400)
RBC: 3.87 MIL/uL (ref 3.87–5.11)
RDW: 14.9 % (ref 11.5–15.5)
WBC: 5.8 10*3/uL (ref 4.0–10.5)
nRBC: 2.6 % — ABNORMAL HIGH (ref 0.0–0.2)

## 2019-03-27 LAB — URINALYSIS, ROUTINE W REFLEX MICROSCOPIC
Bilirubin Urine: NEGATIVE
Glucose, UA: NEGATIVE mg/dL
Hgb urine dipstick: NEGATIVE
Ketones, ur: NEGATIVE mg/dL
Leukocytes,Ua: NEGATIVE
Nitrite: NEGATIVE
Protein, ur: NEGATIVE mg/dL
Specific Gravity, Urine: 1.021 (ref 1.005–1.030)
pH: 5 (ref 5.0–8.0)

## 2019-03-27 LAB — BASIC METABOLIC PANEL
Anion gap: 17 — ABNORMAL HIGH (ref 5–15)
BUN: 16 mg/dL (ref 6–20)
CO2: 23 mmol/L (ref 22–32)
Calcium: 10.1 mg/dL (ref 8.9–10.3)
Chloride: 97 mmol/L — ABNORMAL LOW (ref 98–111)
Creatinine, Ser: 0.56 mg/dL (ref 0.44–1.00)
GFR calc Af Amer: >60 mL/min (ref >60)
GFR calc non Af Amer: >60 mL/min (ref >60)
Glucose, Bld: 98 mg/dL (ref 70–99)
Potassium: 3.5 mmol/L (ref 3.5–5.1)
Sodium: 137 mmol/L (ref 135–145)

## 2019-03-27 MED ORDER — SODIUM CHLORIDE 0.9 % IV BOLUS: 1000.0000 mL | Freq: Once | INTRAVENOUS | Status: DC

## 2019-03-27 MED ORDER — MORPHINE SULFATE (PF) 2 MG/ML IV SOLN
2.0000 mg | INTRAVENOUS | Status: DC | PRN
  Administered 2019-03-27 – 2019-03-29 (×7): 2 mg via INTRAVENOUS
  Filled 2019-03-27 (×7): qty 1

## 2019-03-27 MED ORDER — KCL IN DEXTROSE-NACL 20-5-0.9 MEQ/L-%-% IV SOLN
INTRAVENOUS | Status: AC
  Administered 2019-03-28 (×2): via INTRAVENOUS
  Filled 2019-03-27 (×5): qty 1000

## 2019-03-27 MED ORDER — SODIUM CHLORIDE 0.9 % IV BOLUS: 500.0000 mL | Freq: Once | INTRAVENOUS | Status: DC

## 2019-03-27 MED ORDER — HYDROMORPHONE HCL 1 MG/ML IJ SOLN
0.5000 mg | Freq: Once | INTRAMUSCULAR | Status: AC
  Administered 2019-03-27: 0.5 mg via INTRAMUSCULAR
  Filled 2019-03-27: qty 1

## 2019-03-27 MED ORDER — DEXAMETHASONE SODIUM PHOSPHATE 4 MG/ML IJ SOLN
10.0000 mg | Freq: Once | INTRAMUSCULAR | Status: AC
  Administered 2019-03-27: 10 mg via INTRAVENOUS
  Filled 2019-03-27: qty 3

## 2019-03-27 MED ORDER — GADOBUTROL 1 MMOL/ML IV SOLN
10.0000 mL | Freq: Once | INTRAVENOUS | Status: AC | PRN
  Administered 2019-03-27: 10 mL via INTRAVENOUS

## 2019-03-27 MED ORDER — SODIUM CHLORIDE 0.9 % IV BOLUS
1000.0000 mL | Freq: Once | INTRAVENOUS | Status: AC
  Administered 2019-03-27: 1000 mL via INTRAVENOUS

## 2019-03-27 NOTE — H&P (Signed)
History and Physical    Brandi Rodriguez UPJ:[id number] DOB: [birth year] DOA: 03/27/2019  PCP: Manon Hilding, MD   Patient coming from: Home  I have personally briefly reviewed patient's old medical records in [place]  Chief Complaint: Falls  HPI: Brandi Rodriguez is a 58 y.o. female with medical history significant for metastatic cancer who was sent to the ED from the cancer center with reports of falls and lower extremity weakness.  Patient reports falls 3 times over the past 2 days.  She reports lower extremity weakness left worse than right that started yesterday, with pain in her back also.  Denies problems with urination or bowel movements. A Week and a half ago she noticed that her right eyelid drooped.  She is currently undergoing work-up for her metastatic cancer, after MRI lumbar spine 03/01/19 showed innumerable bony lesions in the low presacral region, with epidural tumor at L5, sided epidural and paravertebral tumor at L1 involving T12-L1.  She had axillary lymph node biopsy 03/25/2019.  Pathology report is pending but preliminary results consistent with poorly differentiated carcinoma. Patient followed up with her oncologist Dr. Delton Coombes today, stat MRI of the thoracic spine showed diffuse osseous metastatic disease secondary to large left chest wall mass, epidural tumor deposit centered at T9 and L1 subsequently transferred to the ER, with recommendation for transfer to [facility] with neurosurgery and radiatio oncology consultation.  ED Course: Tachycardic to 120s. O2 sats 88 to 91% on room air. Tachypneic to 33. Hgb stable at 10, Unremarkable BMP. Unremarkable UA. EDP talked to neurosurgery on [facility], who agreed to transfer to [facility], under hopsitlaist service and give 10mg  decadron.   Review of Systems: As per HPI all other systems reviewed and negative.  Past Medical History:  Diagnosis Date   Arthritis     Past Surgical History:  Procedure  Laterality Date   AXILLARY LYMPH NODE BIOPSY Left 03/25/2019   Procedure: LEFT AXILLARY LYMPH NODE BIOPSY;  Surgeon: Aviva Signs, MD;  Location: [facility];  Service: General;  Laterality: Left;   BACK SURGERY     BREAST SURGERY       reports that she quit smoking about 10 years ago. Her smoking use included cigarettes. She has a 15.00 pack-year smoking history. She has never used smokeless tobacco. She reports previous alcohol use. She reports that she does not use drugs.  Allergies  Allergen Reactions   Hydrocodone Other (See Comments)    Makes heart pound    Family History  Problem Relation Age of Onset   Arthritis Mother    Heart disease Father    Heart disease Sister    Arthritis Brother    Heart disease Sister     Prior to Admission medications   Medication Sig Start Date End Date Taking? Authorizing Provider  ALPRAZolam Duanne Moron) 0.5 MG tablet Take 0.5 mg by mouth 2 (two) times daily as needed for anxiety.  02/16/19  Yes [provider]  ibuprofen (ADVIL) 800 MG tablet Take 800 mg by mouth 4 (four) times daily.  01/10/19  Yes [provider]  Iron-Vitamins (GERITOL PO) Take 1 tablet by mouth daily.   Yes [provider]  traMADol (ULTRAM) 50 MG tablet Take 1 tablet (50 mg total) by mouth every 6 (six) hours as needed. 03/25/19  Yes Aviva Signs, MD  venlafaxine XR (EFFEXOR-XR) 75 MG 24 hr capsule Take 75 mg by mouth at bedtime.  02/14/19  Yes [provider]  vitamin  C (ASCORBIC ACID) 500 MG tablet Take 500 mg by mouth daily.   Yes [provider]    Physical Exam: Vitals:   03/27/19 1730 03/27/19 1800 03/27/19 1930 03/27/19 2100  BP: 118/65 97/71 134/67   Pulse:  (!) 118  (!) 120  Resp: (!) 21 (!) 21 (!) 28 (!) 30  Temp:      TempSrc:      SpO2:  90%  (!) 88%  Weight:      Height:        Constitutional: NAD, calm, comfortable Vitals:   03/27/19 1730 03/27/19 1800 03/27/19 1930 03/27/19 2100  BP: 118/65 97/71 134/67     Pulse:  (!) 118  (!) 120  Resp: (!) 21 (!) 21 (!) 28 (!) 30  Temp:      TempSrc:      SpO2:  90%  (!) 88%  Weight:      Height:       Eyes: PERRL, lids and conjunctivae normal ENMT: Mucous membranes are dry.   Posterior pharynx clear of any exudate or lesions.  Neck: normal, supple, no masses, no thyromegaly Respiratory: clear to auscultation bilaterally, no wheezing, no crackles. Normal respiratory effort. No accessory muscle use.  Cardiovascular: Regular rate and rhythm, no murmurs / rubs / gallops. Trace extremity edema bilat, chronic and unchanged. 2+ pedal pulses.   Abdomen: no tenderness, no masses palpated. No hepatosplenomegaly. Bowel sounds positive.  Musculoskeletal: no clubbing / cyanosis. No joint deformity upper and lower extremities. Good ROM, no contractures. Normal muscle tone.  Skin: no rashes, lesions, ulcers. No induration Neurologic: CN 2-12 grossly intact. Bilat upper extremity stenght 5/5, Lower extremity strenght 4 /5 R >> L.Marland Kitchen  Psychiatric: Normal judgment and insight. Alert and oriented x 3. Normal mood.   Labs on Admission: I have personally reviewed following labs and imaging studies  CBC: Recent Labs  Lab 03/27/19 1705  WBC 5.8  HGB 10.0*  HCT 33.9*  MCV 87.6  PLT 382   Basic Metabolic Panel: Recent Labs  Lab 03/27/19 1705  NA 137  K 3.5  CL 97*  CO2 23  GLUCOSE 98  BUN 16  CREATININE 0.56  CALCIUM 10.1   Urine analysis:    Component Value Date/Time   COLORURINE YELLOW 03/27/2019 1510   APPEARANCEUR CLEAR 03/27/2019 1510   LABSPEC 1.021 03/27/2019 1510   PHURINE 5.0 03/27/2019 1510   GLUCOSEU NEGATIVE 03/27/2019 1510   HGBUR NEGATIVE 03/27/2019 1510   BILIRUBINUR NEGATIVE 03/27/2019 1510   KETONESUR NEGATIVE 03/27/2019 1510   PROTEINUR NEGATIVE 03/27/2019 1510   NITRITE NEGATIVE 03/27/2019 1510   LEUKOCYTESUR NEGATIVE 03/27/2019 1510    Radiological Exams on Admission: Mr Thoracic Spine W Wo Contrast  Result Date:  03/27/2019 CLINICAL DATA:  Thoracic spine pain for approximately 2 weeks. History of multiple falls. EXAM: MRI THORACIC WITHOUT AND WITH CONTRAST TECHNIQUE: Multiplanar and multiecho pulse sequences of the thoracic spine were obtained without and with intravenous contrast. CONTRAST:  10 cc Gadavist IV. COMPARISON:  None. FINDINGS: MRI THORACIC SPINE FINDINGS Alignment:  Maintained. Vertebrae: There is abnormal marrow signal and enhancement in all imaged vertebral bodies consistent with extensive metastatic disease. Tumor extends into the posterior elements at multiple levels. Mild biconcave compression fracture of L1 demonstrates vertebral body height loss of approximately 35%. There may be a very mild compression fracture of T9. Cord:  Demonstrates normal signal. Paraspinal and other soft tissues: The patient has a mass in the left chest measuring approximately 9 cm  transverse by 9 cm AP 10 cm craniocaudal. The lesion extends posteriorly out of the chest 3 ribs into the soft tissues of the left back including left chest wall and left paraspinous muscles. There is destruction of left ribs at the site of the lesion. Disc levels: T9: Tumor in the pedicles and vertebral body extends into the ventral and lateral epidural spaces effacing CSF about the cord. Tumor deposit measures approximately 2.5 cm craniocaudal by 2 cm transverse by 0.7 cm AP. A large tumor deposit extends from the level of the left T12-L1 foramen to the L1-2 disc interspace. This tumor in falls left paraspinous muscles and extends into the left foramina at T12-L1 and L1-2. It measures approximately 5 cm AP by 4 cm transverse by 5 cm craniocaudal. Tumor deflects the cord to the right and deforms the left side of the cord. It encases the left exiting nerve roots at T12-L1 and L1-2. IMPRESSION: Diffuse osseous metastatic disease secondary to a large left chest mass which extends into the posterior chest wall. Epidural tumor deposits centered at T9 and L1  as described above result in mass effect on the cord and impact exiting nerve roots. These results were called by telephone at the time of interpretation on 03/27/2019 at 1:45 pm to Dr. Derek Jack , who verbally acknowledged these results. Electronically Signed   By: Inge Rise M.D.   On: 03/27/2019 14:18    EKG: Independently reviewed. Sinus Tachy, rate 117, No Significant ST or T wave abnormlity.  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 Mri thoracic Spine- Diffuse Osseous metastatic disease 2/2 large left chest mass which extends into the posterior chest wall.Epidural tumor deposits centered at T9 and L1 as described above result in mass effect on the cord and impact exiting nerve roots. 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  - Head Ct- negative for acute abnormality.  - Will obtain CTA chest W contrast .  Hypoxia- Sats mostly 88 - 89 in the ED. With tachycardia 120s, but per chart on few recent encounters heart rate has ranged between 94  To 108, and she appears dehydrated. She denies dypnea, but appears mildy dyspneic. Covid test- pending - Port Chest Xray-  Diffuse opacity within the left thorax, may reflect combination of pleural effusion/disease and underlying lung consolidation or possible mass, given findings on recent spine MRI. Poorly visible left sixth rib, either resected or due to bony destructive change from skeletal metastatic disease. Chest CT is suggested for further evaluation. - Obtain Chest CTA - 1 L bolus, D5 N/s + 20 KCL 100cc/hr x 1 day   DVT prophylaxis: SCDS- pending surg eval Code Status: Full Family Communication: None at bedside Disposition Plan:  Per rounding team Consults called: Neurosurgery, please consult Radiation Oncology in A.m Admission status: Inpatient, tele I certify that at the point of admission it is my clinical judgment that the patient will require inpatient hospital care spanning beyond 2 midnights from the point of admission due to high intensity of service, high risk for further deterioration and high frequency of surveillance required. The following factors support the patient status of inpatient: Lower extremity weakness 2/2 Spinal metastasis.  Bethena Roys MD [facility]  03/27/2019, 9:25 PM

## 2019-03-27 NOTE — ED Notes (Signed)
Pt has on two armbands.  One for ED and one for cancer center.

## 2019-03-27 NOTE — ED Notes (Signed)
Pt given meal to eat and ginger ale.

## 2019-03-27 NOTE — Progress Notes (Signed)
[place], [place] [address]   CLINIC:  Medical Oncology/Hematology  PCP:  Manon Hilding, MD [place] [address] [phone]   REASON FOR VISIT:  Follow-up for metastatic cancer.     INTERVAL HISTORY:  Brandi Rodriguez 59 y.o. female returns for follow-up.  She missed her MRI of the thoracic spine appointment.  PET CT scan was denied by her insurance.  She did have a left axillary lymph node biopsy done by Dr. Arnoldo Morale on 03/25/2019.  Her appetite has been good.  She reported falling at home.  She is walking with a cane and walker.  She has numbness in the legs.  She has pain in her back and legs.  Denies any fevers or infections.  Appetite is 75%.  Energy levels are low.   REVIEW OF SYSTEMS:  Review of Systems  Cardiovascular: Positive for leg swelling.  Musculoskeletal: Positive for back pain.  Neurological: Positive for extremity weakness and numbness.  All other systems reviewed and are negative.    PAST MEDICAL/SURGICAL HISTORY:  Past Medical History:  Diagnosis Date  . Arthritis    Past Surgical History:  Procedure Laterality Date  . AXILLARY LYMPH NODE BIOPSY Left 03/25/2019   Procedure: LEFT AXILLARY LYMPH NODE BIOPSY;  Surgeon: Aviva Signs, MD;  Location: [facility];  Service: General;  Laterality: Left;  . BACK SURGERY    . BREAST SURGERY       SOCIAL HISTORY:  Social History   Socioeconomic History  . Marital status: Married    Spouse name: Shanon Brow  . Number of children: 3  . Years of education: Not on file  . Highest education level: Not on file  Occupational History  . Not on file  Social Needs  . Financial resource strain: Not hard at all  . Food insecurity:    Worry: Never true    Inability: Never true  . Transportation needs:    Medical: No    Non-medical: No  Tobacco Use  . Smoking status: Former Smoker    Packs/day: 1.00    Years: 15.00    Pack years: 15.00    Types: Cigarettes    Last attempt  to quit: 03/11/2009    Years since quitting: 10.0  . Smokeless tobacco: Never Used  Substance and Sexual Activity  . Alcohol use: Not Currently  . Drug use: Never  . Sexual activity: Not on file  Lifestyle  . Physical activity:    Days per week: 0 days    Minutes per session: 0 min  . Stress: Only a little  Relationships  . Social connections:    Talks on phone: Once a week    Gets together: Twice a week    Attends religious service: More than 4 times per year    Active member of club or organization: No    Attends meetings of clubs or organizations: Never    Relationship status: Married  . Intimate partner violence:    Fear of current or ex partner: No    Emotionally abused: No    Physically abused: No    Forced sexual activity: No  Other Topics Concern  . Not on file  Social History Narrative   ** Merged History Encounter **        FAMILY HISTORY:  Family History  Problem Relation Age of Onset  . Arthritis Mother   . Heart disease Father   . Heart disease Sister   .  Arthritis Brother   . Heart disease Sister     CURRENT MEDICATIONS:  No facility-administered encounter medications on file as of 03/27/2019.    Outpatient Encounter Medications as of 03/27/2019  Medication Sig  . ALPRAZolam (XANAX) 0.5 MG tablet Take 0.5 mg by mouth 2 (two) times daily as needed for anxiety.   Marland Kitchen ibuprofen (ADVIL) 800 MG tablet Take 800 mg by mouth 4 (four) times daily.   . Iron-Vitamins (GERITOL PO) Take 1 tablet by mouth daily.  . traMADol (ULTRAM) 50 MG tablet Take 1 tablet (50 mg total) by mouth every 6 (six) hours as needed.  . venlafaxine XR (EFFEXOR-XR) 75 MG 24 hr capsule Take 75 mg by mouth at bedtime.   . vitamin C (ASCORBIC ACID) 500 MG tablet Take 500 mg by mouth daily.    ALLERGIES:  Allergies  Allergen Reactions  . Hydrocodone Other (See Comments)    Makes heart pound     PHYSICAL EXAM:  ECOG Performance status: 3  Vitals:   03/27/19 1016  BP: 132/64   Pulse: (!) 110  Resp: 18  Temp: 99 F (37.2 C)  SpO2: 95%   Filed Weights   03/27/19 1016  Weight: 222 lb (100.7 kg)    Physical Exam Vitals signs reviewed.  Constitutional:      Appearance: Normal appearance.  Cardiovascular:     Rate and Rhythm: Normal rate and regular rhythm.     Heart sounds: Normal heart sounds.  Pulmonary:     Effort: Pulmonary effort is normal.     Breath sounds: Normal breath sounds.  Abdominal:     General: There is no distension.     Palpations: Abdomen is soft. There is no mass.  Musculoskeletal:     Right lower leg: Edema present.     Left lower leg: Edema present.  Neurological:     General: No focal deficit present.     Mental Status: She is alert and oriented to person, place, and time.     Motor: Weakness present.  Psychiatric:        Mood and Affect: Mood normal.        Behavior: Behavior normal.    Left leg weakness more than the right. Right eye ptosis with miosis.  LABORATORY DATA:  I have reviewed the labs as listed.  CBC    Component Value Date/Time   WBC 5.8 03/27/2019 1705   RBC 3.87 03/27/2019 1705   HGB 10.0 (L) 03/27/2019 1705   HCT 33.9 (L) 03/27/2019 1705   PLT 197 03/27/2019 1705   MCV 87.6 03/27/2019 1705   MCH 25.8 (L) 03/27/2019 1705   MCHC 29.5 (L) 03/27/2019 1705   RDW 14.9 03/27/2019 1705   LYMPHSABS 1.8 03/12/2019 1421   MONOABS 0.5 03/12/2019 1421   EOSABS 0.0 03/12/2019 1421   BASOSABS 0.1 03/12/2019 1421   CMP Latest Ref Rng & Units 03/27/2019 03/12/2019 01/17/2011  Glucose 70 - 99 mg/dL 98 114(H) 110(H)  BUN 6 - 20 mg/dL '16 17 10  '$ Creatinine 0.44 - 1.00 mg/dL 0.56 0.51 0.81  Sodium 135 - 145 mmol/L 137 140 137  Potassium 3.5 - 5.1 mmol/L 3.5 3.8 3.5  Chloride 98 - 111 mmol/L 97(L) 102 104  CO2 22 - 32 mmol/L '23 23 24  '$ Calcium 8.9 - 10.3 mg/dL 10.1 9.7 9.6  Total Protein 6.5 - 8.1 g/dL - 7.2 -  Total Bilirubin 0.3 - 1.2 mg/dL - 0.4 -  Alkaline Phos 38 - 126 U/L -  310(H) -  AST 15 - 41 U/L - 34  -  ALT 0 - 44 U/L - 69(H) -       DIAGNOSTIC IMAGING:  I have independently reviewed the scans and discussed with the patient.   I have reviewed Venita Lick LPN's note and agree with the documentation.  I personally performed a face-to-face visit, made revisions and my assessment and plan is as follows.    ASSESSMENT & PLAN:   Metastatic cancer to spine ([place]) 1.  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 -Seen at [facility] on 03/01/2019 with MRI of the lumbar spine showing innumerable bony lesions in the lumbosacral region.  Anterior epidural tumor at L5 and impingement of bilateral descending L5 roots.  Left-sided epidural and paravertebral tumor at L1 involving T12-L1 and L1-L2 neural foramina. - I have recommended a PET scan and thoracic MRI.  Patient missed appointments.  Multiple myeloma blood work was negative. -She had a biopsy of the left axillary lymph node done on 03/25/2019.  Pathology report is pending.  Preliminary report consistent with poorly differentiated carcinoma. - From her last visit, she fell couple of times.  Her left leg is severely weak with strength 1 out of 5. - I have done a stat MRI of the thoracic spine which showed diffuse osseous metastatic disease secondary to large left chest wall mass.  Epidural tumor deposit centered at T9 and L1. - I have called and transferred her to the ER.  She will be transferred to [facility] for consultation with neurosurgery and radiation oncology.  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    Total time spent is 40 minutes with more than 50% of the time spent face-to-face discussing scan results, further plan of action and coordination of care.    Orders placed  this encounter:  Orders Placed This Encounter  Procedures  . MR Thoracic Spine [place], [place] [phone]

## 2019-03-27 NOTE — Assessment & Plan Note (Addendum)
1.  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 -Seen at [facility] on 03/01/2019 with MRI of the lumbar spine showing innumerable bony lesions in the lumbosacral region.  Anterior epidural tumor at L5 and impingement of bilateral descending L5 roots.  Left-sided epidural and paravertebral tumor at L1 involving T12-L1 and L1-L2 neural foramina. - I have recommended a PET scan and thoracic MRI.  Patient missed appointments.  Multiple myeloma blood work was negative. -She had a biopsy of the left axillary lymph node done on 03/25/2019.  Pathology report is pending.  Preliminary report consistent with poorly differentiated carcinoma. - From her last visit, she fell couple of times.  Her left leg is severely weak with strength 1 out of 5. - I have done a stat MRI of the thoracic spine which showed diffuse osseous metastatic disease secondary to large left chest wall mass.  Epidural tumor deposit centered at T9 and L1. - I have called and transferred her to the ER.  She will be transferred to [facility] for consultation with neurosurgery and radiation oncology.  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

## 2019-03-27 NOTE — Patient Instructions (Signed)
[place] at [facility] Discharge Instructions  You were seen today by Dr. Delton Coombes. He went over your recent biopsy results. He will see you back in for labs and follow up.   Thank you for choosing [place] at [facility] to provide your oncology and hematology care.  To afford each patient quality time with our provider, please arrive at least 15 minutes before your scheduled appointment time.   If you have a lab appointment with the [facility] please come in thru the  Main Entrance and check in at the main information desk  You need to re-schedule your appointment should you arrive 10 or more minutes late.  We strive to give you quality time with our providers, and arriving late affects you and other patients whose appointments are after yours.  Also, if you no show three or more times for appointments you may be dismissed from the clinic at the providers discretion.     Again, thank you for choosing [facility].  Our hope is that these requests will decrease the amount of time that you wait before being seen by our physicians.       _____________________________________________________________  Should you have questions after your visit to [facility], please contact our office at [phone] between the hours of 8:00 a.m. and 4:30 p.m.  Voicemails left after 4:00 p.m. will not be returned until the following business day.  For prescription refill requests, have your pharmacy contact our office and allow 72 hours.    Cancer Center Support Programs:   > Cancer Support Group  2nd Tuesday of the month 1pm-2pm, Journey Room

## 2019-03-27 NOTE — ED Provider Notes (Signed)
[facility] EMERGENCY DEPARTMENT Provider Note   CSN: [id number] Arrival date & time: 03/27/19  1437    History   Chief Complaint Chief Complaint  Patient presents with  . Back Pain    HPI Brandi Rodriguez is a 58 y.o. female presenting for evaluation of back pain and weakness.  Patient states she has had gradually worsening back pain and weakness.  Patient states she was seen by her oncologist today, had an MRI and she was told to come to the emergency room so she can be transferred to [facility] for further management.  Patient states she fell 3 times yesterday due to weakness.  She denies hitting her head or loss of consciousness.  She has been taking ibuprofen and tramadol for pain without significant improvement.  She denies fevers, chills, chest pain, shortness breath, nausea, vomiting, abdominal pain, loss of bowel bladder control.  Patient states this was her second visit with oncology, she has not started chemo or radiation.  Additional history obtained from chart review.  MRI obtained today showed tumor at T9 in the pedicles and vertebral body effacing CSF about the cord.  Additionally, at T12/L1 to L1/L2, patient with a large tumor deflecting the cord to the right and deforming the left side of the cord and encasing the nerve roots.      HPI  Past Medical History:  Diagnosis Date  . Arthritis     Patient Active Problem List   Diagnosis Date Noted  . Metastatic cancer to spine ([place]) 03/27/2019  . Axillary lymphadenopathy   . Lytic lesion of bone on x-ray 03/12/2019    Past Surgical History:  Procedure Laterality Date  . AXILLARY LYMPH NODE BIOPSY Left 03/25/2019   Procedure: LEFT AXILLARY LYMPH NODE BIOPSY;  Surgeon: Aviva Signs, MD;  Location: [facility];  Service: General;  Laterality: Left;  . BACK SURGERY    . BREAST SURGERY       OB History   No obstetric history on file.      Home Medications    Prior to Admission medications   Medication Sig Start  Date End Date Taking? Authorizing Provider  ALPRAZolam Duanne Moron) 0.5 MG tablet Take 0.5 mg by mouth 2 (two) times daily as needed for anxiety.  02/16/19   [provider]  ibuprofen (ADVIL) 800 MG tablet Take 800 mg by mouth 4 (four) times daily.  01/10/19   [provider]  Iron-Vitamins (GERITOL PO) Take 1 tablet by mouth daily.    [provider]  traMADol (ULTRAM) 50 MG tablet Take 1 tablet (50 mg total) by mouth every 6 (six) hours as needed. 03/25/19   Aviva Signs, MD  venlafaxine XR (EFFEXOR-XR) 75 MG 24 hr capsule Take 75 mg by mouth at bedtime.  02/14/19   [provider]  vitamin C (ASCORBIC ACID) 500 MG tablet Take 500 mg by mouth daily.    [provider]    Family History Family History  Problem Relation Age of Onset  . Arthritis Mother   . Heart disease Father   . Heart disease Sister   . Arthritis Brother   . Heart disease Sister     Social History Social History   Tobacco Use  . Smoking status: Former Smoker    Packs/day: 1.00    Years: 15.00    Pack years: 15.00    Types: Cigarettes    Last attempt to quit: 03/11/2009    Years since quitting: 10.0  . Smokeless tobacco: Never  Used  Substance Use Topics  . Alcohol use: Not Currently  . Drug use: Never     Allergies   Hydrocodone   Review of Systems Review of Systems  Musculoskeletal: Positive for back pain.  Neurological: Positive for weakness.  All other systems reviewed and are negative.    Physical Exam Updated Vital Signs BP (!) 110/99   Pulse (!) 108   Temp 98.2 F (36.8 C) (Oral)   Resp (!) 33   Ht 5\' 6"  (1.676 m)   Wt 100.7 kg   LMP 05/22/2011   SpO2 (!) 87%   BMI 35.83 kg/m   Physical Exam Vitals signs and nursing note reviewed.  Constitutional:      General: She is not in acute distress.    Appearance: She is well-developed.     Comments: obese F who appears uncomfortable due to pain, in NAD  HENT:     Head: Normocephalic and  atraumatic.  Eyes:     Extraocular Movements: Extraocular movements intact.     Conjunctiva/sclera: Conjunctivae normal.     Pupils: Pupils are equal, round, and reactive to light.  Neck:     Musculoskeletal: Normal range of motion and neck supple.  Cardiovascular:     Rate and Rhythm: Regular rhythm. Tachycardia present.     Comments: tachycardic around 110 Pulmonary:     Effort: Pulmonary effort is normal. No respiratory distress.     Breath sounds: Normal breath sounds. No wheezing.  Abdominal:     General: There is no distension.     Palpations: Abdomen is soft. There is no mass.     Tenderness: There is no abdominal tenderness. There is no guarding or rebound.  Musculoskeletal: Normal range of motion.  Skin:    General: Skin is warm and dry.     Capillary Refill: Capillary refill takes less than 2 seconds.  Neurological:     Mental Status: She is alert and oriented to person, place, and time.     Comments: sensation of lower extremities intact. No saddle paresthesias      ED Treatments / Results  Labs (all labs ordered are listed, but only abnormal results are displayed) Labs Reviewed  CBC - Abnormal; Notable for the following components:      Result Value   Hemoglobin 10.0 (*)    HCT 33.9 (*)    MCH 25.8 (*)    MCHC 29.5 (*)    nRBC 2.6 (*)    All other components within normal limits  BASIC METABOLIC PANEL - Abnormal; Notable for the following components:   Chloride 97 (*)    Anion gap 17 (*)    All other components within normal limits  NOVEL CORONAVIRUS, NAA (HOSPITAL ORDER, SEND-OUT TO REF LAB)  URINALYSIS, ROUTINE W REFLEX MICROSCOPIC    EKG None  Radiology Mr Thoracic Spine W Wo Contrast  Result Date: 03/27/2019 CLINICAL DATA:  Thoracic spine pain for approximately 2 weeks. History of multiple falls. EXAM: MRI THORACIC WITHOUT AND WITH CONTRAST TECHNIQUE: Multiplanar and multiecho pulse sequences of the thoracic spine were obtained without and with  intravenous contrast. CONTRAST:  10 cc Gadavist IV. COMPARISON:  None. FINDINGS: MRI THORACIC SPINE FINDINGS Alignment:  Maintained. Vertebrae: There is abnormal marrow signal and enhancement in all imaged vertebral bodies consistent with extensive metastatic disease. Tumor extends into the posterior elements at multiple levels. Mild biconcave compression fracture of L1 demonstrates vertebral body height loss of approximately 35%. There may be a very mild compression  fracture of T9. Cord:  Demonstrates normal signal. Paraspinal and other soft tissues: The patient has a mass in the left chest measuring approximately 9 cm transverse by 9 cm AP 10 cm craniocaudal. The lesion extends posteriorly out of the chest 3 ribs into the soft tissues of the left back including left chest wall and left paraspinous muscles. There is destruction of left ribs at the site of the lesion. Disc levels: T9: Tumor in the pedicles and vertebral body extends into the ventral and lateral epidural spaces effacing CSF about the cord. Tumor deposit measures approximately 2.5 cm craniocaudal by 2 cm transverse by 0.7 cm AP. A large tumor deposit extends from the level of the left T12-L1 foramen to the L1-2 disc interspace. This tumor in falls left paraspinous muscles and extends into the left foramina at T12-L1 and L1-2. It measures approximately 5 cm AP by 4 cm transverse by 5 cm craniocaudal. Tumor deflects the cord to the right and deforms the left side of the cord. It encases the left exiting nerve roots at T12-L1 and L1-2. IMPRESSION: Diffuse osseous metastatic disease secondary to a large left chest mass which extends into the posterior chest wall. Epidural tumor deposits centered at T9 and L1 as described above result in mass effect on the cord and impact exiting nerve roots. These results were called by telephone at the time of interpretation on 03/27/2019 at 1:45 pm to Dr. Derek Jack , who verbally acknowledged these results.  Electronically Signed   By: Inge Rise M.D.   On: 03/27/2019 14:18    Procedures Procedures (including critical care time)  Medications Ordered in ED Medications  dexamethasone (DECADRON) injection 10 mg (has no administration in time range)  HYDROmorphone (DILAUDID) injection 0.5 mg (0.5 mg Intramuscular Given 03/27/19 1614)     Initial Impression / Assessment and Plan / ED Course  I have reviewed the triage vital signs and the nursing notes.  Pertinent labs & imaging results that were available during my care of the patient were reviewed by me and considered in my medical decision making (see chart for details).        Patient presenting for evaluation of worsening back pain and weakness, causing multiple falls.  Was encouraged to come to the ED by oncology.  On exam, patient appears nontoxic, although uncomfortable due to pain.  MRI reviewed, shows multiple tumors with concerning vertebral/spinal cord involvement.  Discussed with Dr. Delton Coombes from oncology, who requests that we consult with neurosurgery to see if they would like to evaluate and/or manage this patient.  If not, recommends admission for radiation.  Discussed with V Costella from neurosurgery, who recommends admission to medical service at [facility] for neuro eval, and recommends giving 10 mg decadron.   Of note, pt's SpO2 is documented in the upper 80's. On my exam, pt's hands were cold and spo2 had poor waveform. With warmth, waveform improved and spo2 was in the 90's.   Will order basic labs, as no recent labs are seen in my chart review. Dilaudid for pain.   Labs without concerning abnormalities. Will call for admission.  Discussed with Dr. Theresa Duty from [facility] pt to be admitted.    Final Clinical Impressions(s) / ED Diagnoses   Final diagnoses:  Metastatic cancer to spine [facility])  Weakness    ED Discharge Orders    None       Franchot Heidelberg, PA-C 03/27/19 1807    Milton Ferguson, MD 03/28/19  2226

## 2019-03-27 NOTE — ED Triage Notes (Signed)
Pt was wheeled down from [facility]. Pt thinks it has something to do with her spine, but it may be her lung, she is not sure. States she is having a lot of pain in her back and spine and has been falling recently.

## 2019-03-28 ENCOUNTER — Ambulatory Visit: Payer: BC Managed Care – PPO

## 2019-03-28 ENCOUNTER — Ambulatory Visit
Admit: 2019-03-28 | Discharge: 2019-03-28 | Disposition: A | Discharge status: Home / Self Care | Payer: BC Managed Care – PPO | Attending: Radiation Oncology | Admitting: Radiation Oncology

## 2019-03-28 ENCOUNTER — Ambulatory Visit
Admit: 2019-03-28 | Discharge: 2019-03-28 | Disposition: A | Discharge status: Home / Self Care | Payer: BC Managed Care – PPO | Source: Ambulatory Visit | Attending: Radiation Oncology | Admitting: Radiation Oncology

## 2019-03-28 DIAGNOSIS — G9520 Unspecified cord compression: Secondary | ICD-10-CM

## 2019-03-28 DIAGNOSIS — C7951 Secondary malignant neoplasm of bone: Secondary | ICD-10-CM

## 2019-03-28 DIAGNOSIS — W19XXXA Unspecified fall, initial encounter: Secondary | ICD-10-CM

## 2019-03-28 MED ORDER — ACETAMINOPHEN 325 MG PO TABS: 650.0000 mg | ORAL_TABLET | Freq: Four times a day (QID) | ORAL | Status: DC | PRN

## 2019-03-28 MED ORDER — PANTOPRAZOLE SODIUM 40 MG PO TBEC
40.0000 mg | DELAYED_RELEASE_TABLET | Freq: Every day | ORAL | Status: DC
  Administered 2019-03-28 – 2019-03-29 (×2): 40 mg via ORAL
  Filled 2019-03-28 (×2): qty 1

## 2019-03-28 MED ORDER — POLYETHYLENE GLYCOL 3350 17 G PO PACK: 17.0000 g | PACK | Freq: Every day | ORAL | Status: DC | PRN

## 2019-03-28 MED ORDER — ONDANSETRON HCL 4 MG/2ML IJ SOLN: 4.0000 mg | Freq: Four times a day (QID) | INTRAMUSCULAR | Status: DC | PRN

## 2019-03-28 MED ORDER — ACETAMINOPHEN 650 MG RE SUPP: 650.0000 mg | Freq: Four times a day (QID) | RECTAL | Status: DC | PRN

## 2019-03-28 MED ORDER — ONDANSETRON HCL 4 MG PO TABS: 4.0000 mg | ORAL_TABLET | Freq: Four times a day (QID) | ORAL | Status: DC | PRN

## 2019-03-28 MED ORDER — DEXAMETHASONE 4 MG PO TABS
4.0000 mg | ORAL_TABLET | Freq: Four times a day (QID) | ORAL | Status: DC
  Administered 2019-03-28 – 2019-03-29 (×6): 4 mg via ORAL
  Filled 2019-03-28 (×6): qty 1

## 2019-03-28 NOTE — Plan of Care (Signed)
  Problem: Education: Goal: Knowledge of General Education information will improve Description: Including pain rating scale, medication(s)/side effects and non-pharmacologic comfort measures 03/28/2019 0703 by Narda Rutherford, RN Outcome: Progressing 03/28/2019 0702 by Narda Rutherford, RN Outcome: Progressing   Problem: Health Behavior/Discharge Planning: Goal: Ability to manage health-related needs will improve 03/28/2019 0703 by Narda Rutherford, RN Outcome: Progressing 03/28/2019 0702 by Narda Rutherford, RN Outcome: Progressing   Problem: Clinical Measurements: Goal: Ability to maintain clinical measurements within normal limits will improve 03/28/2019 0703 by Narda Rutherford, RN Outcome: Progressing 03/28/2019 0702 by Narda Rutherford, RN Outcome: Progressing Goal: Will remain free from infection 03/28/2019 0703 by Narda Rutherford, RN Outcome: Progressing 03/28/2019 0702 by Narda Rutherford, RN Outcome: Progressing Goal: Diagnostic test results will improve 03/28/2019 0703 by Narda Rutherford, RN Outcome: Progressing 03/28/2019 0702 by Narda Rutherford, RN Outcome: Progressing Goal: Respiratory complications will improve 03/28/2019 0703 by Narda Rutherford, RN Outcome: Progressing 03/28/2019 0702 by Narda Rutherford, RN Outcome: Progressing Goal: Cardiovascular complication will be avoided 03/28/2019 0703 by Narda Rutherford, RN Outcome: Progressing 03/28/2019 0702 by Narda Rutherford, RN Outcome: Progressing   Problem: Activity: Goal: Risk for activity intolerance will decrease 03/28/2019 0703 by Narda Rutherford, RN Outcome: Progressing 03/28/2019 0702 by Narda Rutherford, RN Outcome: Progressing   Problem: Nutrition: Goal: Adequate nutrition will be maintained 03/28/2019 0703 by Narda Rutherford, RN Outcome: Progressing 03/28/2019 0702 by Narda Rutherford, RN Outcome: Progressing   Problem: Coping: Goal: Level of anxiety will decrease 03/28/2019 0703 by Narda Rutherford, RN Outcome:  Progressing 03/28/2019 0702 by Narda Rutherford, RN Outcome: Progressing   Problem: Elimination: Goal: Will not experience complications related to bowel motility 03/28/2019 0703 by Narda Rutherford, RN Outcome: Progressing 03/28/2019 0702 by Narda Rutherford, RN Outcome: Progressing Goal: Will not experience complications related to urinary retention 03/28/2019 0703 by Narda Rutherford, RN Outcome: Progressing 03/28/2019 0702 by Narda Rutherford, RN Outcome: Progressing   Problem: Pain Managment: Goal: General experience of comfort will improve 03/28/2019 0703 by Narda Rutherford, RN Outcome: Progressing 03/28/2019 0702 by Narda Rutherford, RN Outcome: Progressing   Problem: Safety: Goal: Ability to remain free from injury will improve 03/28/2019 0703 by Narda Rutherford, RN Outcome: Progressing 03/28/2019 0702 by Narda Rutherford, RN Outcome: Progressing   Problem: Skin Integrity: Goal: Risk for impaired skin integrity will decrease 03/28/2019 0703 by Narda Rutherford, RN Outcome: Progressing 03/28/2019 0702 by Narda Rutherford, RN Outcome: Progressing

## 2019-03-28 NOTE — Progress Notes (Signed)
Radiation Oncology         [phone] ________________________________  Name: Brandi Rodriguez        MRN: [id number]  Date of Service: 03/28/2019 DOB: [birth year]  JY:NWGNFA, Silvestre Moment, MD  No ref. provider found     REFERRING PHYSICIAN: No ref. provider found   DIAGNOSIS: The primary encounter diagnosis was Metastatic cancer to spine [facility]). Diagnoses of Weakness and Hypoxia were also pertinent to this visit.   HISTORY OF PRESENT ILLNESS: Brandi Rodriguez is a 58 y.o. female seen at the request of Dr. 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 The patient had been experiencing terrible low back pain for more than a few weeks. She ultimately presented to orthopedics and underwent an outside scan, and lumbar MRI on 03/01/2019 revealing innumerable bony lesions in the lumbosacral spine with anterior epidural tumor at L5 with impingement of bilateral L5 nerve roots.  Left-sided epidural and paravertebral tumor at L1 also involved T12-L1 and L1-L2 neuroforamina.  She was seen and evaluated with medical oncology at [facility], she also on physical exam during the assessment had a 1 to 2 cm enlarged left axillary lymph node, and was consulted to be evaluated by neurosurgery.  She was seen by Dr. Arnoldo Morale, and on 03/25/2019 a biopsy of the left axillary node was performed, though the pathology report was pending preliminary report and was consistent with a poorly differentiated carcinoma.  She increasingly started having left low back and lower extremity pain, with weakness.  She has been falling at home on multiple occasions.  In the office yesterday her left leg was severely weak with a strength of 1 out of 5 she was sent to the emergency room, an MRI of the thoracic spine, CT head without contrast and chest x-ray were all performed, her MRI of the thoracic spine revealed a mass at T9 9 x 10 cm extending posteriorly out of the chest into the soft tissues of  the back, ribs and paraspinous muscles.  The tumor involves the ventral and lateral epidural spaces effacing CSF about the cord, and a large tumor deposit was also noted from T12-L1 foramen.  The tumor falls left into the paraspinous muscles and extends to the left foramen from T12-L1 and L1-L2 measuring approximately 5 x 4 cm.  The tumor to flex the cord to the right.  CT head did not reveal any acute intracranial abnormality.  Yesterday's chest x-ray revealed a pleural effusion and diffuse opacity within the left thorax, borderline cardiomegaly and poorly visible left sixth rib.  Given the findings she was evaluated by neurosurgery, it was not felt that surgical intervention would be of benefit, and we were asked to see the patient to consider palliative radiotherapy to these sites.     PREVIOUS RADIATION THERAPY: No   PAST MEDICAL HISTORY:  Past Medical History:  Diagnosis Date   Arthritis        PAST SURGICAL HISTORY: Past Surgical History:  Procedure Laterality Date   AXILLARY LYMPH NODE BIOPSY Left 03/25/2019   Procedure: LEFT AXILLARY LYMPH NODE BIOPSY;  Surgeon: Aviva Signs, MD;  Location: [facility];  Service: General;  Laterality: Left;   BACK SURGERY     BREAST SURGERY       FAMILY HISTORY:  Family History  Problem Relation Age of Onset   Arthritis Mother    Heart disease Father    Heart disease Sister    Arthritis Brother  Heart disease Sister      SOCIAL HISTORY:  reports that she quit smoking about 10 years ago. Her smoking use included cigarettes. She has a 15.00 pack-year smoking history. She has never used smokeless tobacco. She reports previous alcohol use. She reports that she does not use drugs. The patient is married and resides in [place].  She apparently her husband has stage IV cancer as well.    ALLERGIES: Hydrocodone   MEDICATIONS:  Current Facility-Administered Medications  Medication Dose Route Frequency Provider Last Rate Last Dose    acetaminophen (TYLENOL) tablet 650 mg  650 mg Oral Q6H PRN Emokpae, Ejiroghene E, MD       Or   acetaminophen (TYLENOL) suppository 650 mg  650 mg Rectal Q6H PRN Emokpae, Ejiroghene E, MD       dextrose 5 % and 0.9 % NaCl with KCl 20 mEq/L infusion   Intravenous Continuous Emokpae, Ejiroghene E, MD 100 mL/hr at 03/28/19 0542     morphine 2 MG/ML injection 2 mg  2 mg Intravenous Q4H PRN Emokpae, Ejiroghene E, MD   2 mg at 03/27/19 2353   ondansetron (ZOFRAN) tablet 4 mg  4 mg Oral Q6H PRN Emokpae, Ejiroghene E, MD       Or   ondansetron (ZOFRAN) injection 4 mg  4 mg Intravenous Q6H PRN Emokpae, Ejiroghene E, MD       polyethylene glycol (MIRALAX / GLYCOLAX) packet 17 g  17 g Oral Daily PRN Emokpae, Ejiroghene E, MD         REVIEW OF SYSTEMS: On review of systems, the patient reports that she is doing well overall.  She reports her pain is better controlled, she is unable to really assess how her weakness is today.  She reports that she did have some trouble with urinary retention, and has a Foley catheter placed.  She denies any chest pain, shortness of breath, cough, fevers, chills, night sweats, unintended weight changes. She denies any bowel disturbances and denies abdominal pain, nausea or vomiting. She denies any other  musculoskeletal or joint aches or pains. A complete review of systems is obtained and is otherwise negative.     PHYSICAL EXAM:  Wt Readings from Last 3 Encounters:  03/28/19 220 lb 14.4 oz (100.2 kg)  03/27/19 222 lb (100.7 kg)  03/25/19 222 lb (100.7 kg)   Temp Readings from Last 3 Encounters:  03/28/19 98.8 F (37.1 C) (Axillary)  03/27/19 99 F (37.2 C) (Oral)  03/25/19 97.9 F (36.6 C) (Oral)   BP Readings from Last 3 Encounters:  03/28/19 (!) 142/75  03/27/19 132/64  03/25/19 (!) 169/67   Pulse Readings from Last 3 Encounters:  03/28/19 95  03/27/19 (!) 110  03/25/19 (!) 112   Pain Assessment Pain Score: 0-No pain/10 Unable to assess due to  nature of encounter  ECOG = 3  0 - Asymptomatic (Fully active, able to carry on all predisease activities without restriction)  1 - Symptomatic but completely ambulatory (Restricted in physically strenuous activity but ambulatory and able to carry out work of a light or sedentary nature. For example, light housework, office work)  2 - Symptomatic, <50% in bed during the day (Ambulatory and capable of all self care but unable to carry out any work activities. Up and about more than 50% of waking hours)  3 - Symptomatic, >50% in bed, but not bedbound (Capable of only limited self-care, confined to bed or chair 50% or more of waking hours)  4 -  Bedbound (Completely disabled. Cannot carry on any self-care. Totally confined to bed or chair)  5 - Death   Eustace Pen MM, Creech RH, Tormey DC, et al. [phone]). "Toxicity and response criteria of the [facility]". [place] Oncol. 5 (6): 649-55    LABORATORY DATA:  Lab Results  Component Value Date   WBC 5.8 03/27/2019   HGB 10.0 (L) 03/27/2019   HCT 33.9 (L) 03/27/2019   MCV 87.6 03/27/2019   PLT 197 03/27/2019   Lab Results  Component Value Date   NA 137 03/27/2019   K 3.5 03/27/2019   CL 97 (L) 03/27/2019   CO2 23 03/27/2019   Lab Results  Component Value Date   ALT 69 (H) 03/12/2019   AST 34 03/12/2019   ALKPHOS 310 (H) 03/12/2019   BILITOT 0.4 03/12/2019      RADIOGRAPHY: Ct Head Wo Contrast  Result Date: 03/27/2019 CLINICAL DATA:  Initial evaluation for right-sided ptosis. EXAM: CT HEAD WITHOUT CONTRAST TECHNIQUE: Contiguous axial images were obtained from the base of the skull through the vertex without intravenous contrast. COMPARISON:  None. FINDINGS: Brain: Age-related cerebral atrophy with mild chronic small vessel ischemic disease. No acute intracranial hemorrhage. No acute large vessel territory infarct. No mass lesion, midline shift or mass effect. No hydrocephalus. No extra-axial fluid collection.  Vascular: No hyperdense vessel. Scattered vascular calcifications noted within the carotid siphons. Skull: Scalp soft tissues and calvarium within normal limits. Sinuses/Orbits: Globes normal soft tissues demonstrate no acute finding. Layering fluid noted within left sphenoid sinus. Paranasal sinuses are otherwise clear. No mastoid effusion. Other: None. IMPRESSION: 1. No acute intracranial abnormality. 2. Mild age-related cerebral atrophy with chronic microvascular ischemic disease. 3. Acute left sphenoid sinusitis. Electronically Signed   By: Jeannine Boga M.D.   On: 03/27/2019 21:40   Mr Thoracic Spine W Wo Contrast  Result Date: 03/27/2019 CLINICAL DATA:  Thoracic spine pain for approximately 2 weeks. History of multiple falls. EXAM: MRI THORACIC WITHOUT AND WITH CONTRAST TECHNIQUE: Multiplanar and multiecho pulse sequences of the thoracic spine were obtained without and with intravenous contrast. CONTRAST:  10 cc Gadavist IV. COMPARISON:  None. FINDINGS: MRI THORACIC SPINE FINDINGS Alignment:  Maintained. Vertebrae: There is abnormal marrow signal and enhancement in all imaged vertebral bodies consistent with extensive metastatic disease. Tumor extends into the posterior elements at multiple levels. Mild biconcave compression fracture of L1 demonstrates vertebral body height loss of approximately 35%. There may be a very mild compression fracture of T9. Cord:  Demonstrates normal signal. Paraspinal and other soft tissues: The patient has a mass in the left chest measuring approximately 9 cm transverse by 9 cm AP 10 cm craniocaudal. The lesion extends posteriorly out of the chest 3 ribs into the soft tissues of the left back including left chest wall and left paraspinous muscles. There is destruction of left ribs at the site of the lesion. Disc levels: T9: Tumor in the pedicles and vertebral body extends into the ventral and lateral epidural spaces effacing CSF about the cord. Tumor deposit measures  approximately 2.5 cm craniocaudal by 2 cm transverse by 0.7 cm AP. A large tumor deposit extends from the level of the left T12-L1 foramen to the L1-2 disc interspace. This tumor in falls left paraspinous muscles and extends into the left foramina at T12-L1 and L1-2. It measures approximately 5 cm AP by 4 cm transverse by 5 cm craniocaudal. Tumor deflects the cord to the right and deforms the left side of the  cord. It encases the left exiting nerve roots at T12-L1 and L1-2. IMPRESSION: Diffuse osseous metastatic disease secondary to a large left chest mass which extends into the posterior chest wall. Epidural tumor deposits centered at T9 and L1 as described above result in mass effect on the cord and impact exiting nerve roots. These results were called by telephone at the time of interpretation on 03/27/2019 at 1:45 pm to Dr. Derek Jack , who verbally acknowledged these results. Electronically Signed   By: Inge Rise M.D.   On: 03/27/2019 14:18   Dg Chest Port 1 View  Result Date: 03/27/2019 CLINICAL DATA:  Metastatic cancer EXAM: PORTABLE CHEST 1 VIEW COMPARISON:  MRI 03/27/2019, chest x-ray 01/17/2011 FINDINGS: Right lung is clear. Diffuse opacity in the left thorax. Borderline cardiomegaly. No pneumothorax. Poorly visible left sixth rib. IMPRESSION: Diffuse opacity within the left thorax, may reflect combination of pleural effusion/disease and underlying lung consolidation or possible mass, given findings on recent spine MRI. Poorly visible left sixth rib, either resected or due to bony destructive change from skeletal metastatic disease. Chest CT is suggested for further evaluation. Electronically Signed   By: Donavan Foil M.D.   On: 03/27/2019 22:38       IMPRESSION/PLAN: 1. Stage IV, poorly differentiated carcinoma.  The patient is still in the process of being worked up for her diagnosis.  Preliminary pathology confirms a poorly differentiated carcinoma in the left axilla, she does  have disease causing concerns for cord compromise in the thoracic and lumbar spine as well as distraction in the left chest including the chest wall.  Again she is not a surgical candidate at this time, but would be a candidate for palliative radiation to the spine and chest wall.  We discussed the risks, benefits, short and long-term effects of treatment as well as the delivery and logistics.  She is in the process of being transferred from [facility] to [facility] and will simulate this morning.  Written consent will be obtained when she arrives in our department.  We discussed the plan to treat multiple sites and begin her therapy today.  Given the concerns that she does have more cord compromise by her urinary changes, I started dexamethasone and Prilosec for prophylaxis.  She will be given 4 mg of dexamethasone every 6 hours.  She will be given taper instructions at the appropriate interval.  We anticipate 10 fractions, with her first to begin today.   In a visit lasting 70 minutes, greater than 50% of the time was spent in floor time discussing her case, and coordinating the patient's care.    Carola Rhine, PAC

## 2019-03-28 NOTE — ED Notes (Signed)
Pt placed on 2L [place]

## 2019-03-28 NOTE — Progress Notes (Signed)
PROGRESS NOTE    Brandi Rodriguez  DJM:[id number] DOB: Sep 04, 1961 DOA: 03/27/2019 PCP: Manon Hilding, MD    Brief Narrative:  Brandi Rodriguez is a 58 y.o. female with medical history significant for metastatic cancer who was sent to the [facility] ED from the cancer center with reports of falls and lower extremity weakness. Patient reported falls 3 times over the past 2 days.  She reported lower extremity weakness left worse than right that started 1 day PTA with pain in her back also.  Denies problems with urination or bowel movements. A Week and a half ago she noticed that her right eyelid drooped.  She is currently undergoing work-up for her metastatic cancer, after MRI lumbar spine 03/01/19 showed innumerable bony lesions in the low presacral region, with epidural tumor at L5, sided epidural and paravertebral tumor at L1 involving T12-L1.  She had axillary lymph node biopsy 03/25/2019.  Pathology report is pending but preliminary results consistent with poorly differentiated carcinoma.  Patient followed up with her oncologist Dr. Delton Coombes , stat MRI of the thoracic spine showed diffuse osseous metastatic disease secondary to large left chest wall mass, epidural tumor deposit centered at T9 and L1 subsequently transferred to the ER, with recommendation for transfer to [facility] with neurosurgery and radiation oncology consultation.    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 Mri thoracic Spine- Diffuse Osseous metastatic disease 2/2 large left chest mass which extends into the posterior chest wall.Epidural tumor deposits centered at T9 and L1 as described above result in mass effect on the cord and impact exiting nerve roots. - IV Decadron 10mg  X 1 given in ED - Morphine PRN - Foley -Seen by neurosurgery today, recommended radiation therapy.  Epidural mass with no central cord compression.  -Seen by radiation oncology, will start on radiation treatment.  Needs to transfer to [facility] long hospital.  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  - Head Ct- negative for acute abnormality.   Hypoxia-saturating normal.  Most likely due to chest wall tumor.  Ruling out PE with CTA.  Keep on oxygen to keep saturation more than 90%.  Case discussed and examined at bedside with neurosurgery.  Did not recommend to continue steroids. Called and discussed with radiation oncology who evaluated the patient and advised transfer to wait long to start palliative radiation therapy.   DVT prophylaxis: SCDs Code Status: Full code Family Communication: None Disposition Plan: Inpatient.  Transfer to [facility] long.  Will transfer care to hospitalist at [facility] long.   Consultants:   Neurosurgery  Radiation oncology  Procedures:   None  Antimicrobials:   None   Subjective: Patient seen and examined.  No overnight events.  Feels weak on the left leg but able to lift it up.  Objective: Vitals:   03/27/19 2330 03/28/19 0200 03/28/19 0400 03/28/19 0900  BP: (!) 116/58 104/63 124/88 (!) 142/75  Pulse: (!) 107 98 98 95  Resp: (!) 28 (!) 24 (!) 24 (!) 24  Temp:   97.8 F (36.6 C) 98.8 F (37.1 C)  TempSrc:   Oral Axillary  SpO2: (!) 88% 94% 96%   Weight:   100.2 kg   Height:   5\' 6"  (1.676 m)     Intake/Output Summary (Last 24 hours) at 03/28/2019 1051 Last data filed at 03/28/2019 0304 Gross per 24 hour  Intake -  Output 1800 ml  Net -1800 ml   Filed Weights   03/27/19 1439 03/28/19 0400  Weight: 100.7 kg 100.2 kg    Examination:  General exam: Appears calm and comfortable, on room air.  Her oxygen cannula was on her cheek.  Chronically sick looking. Respiratory system: Clear to auscultation. Respiratory effort normal. Cardiovascular system: S1 & S2 heard, RRR. No JVD, murmurs, rubs, gallops or clicks. No pedal edema. Gastrointestinal system: Abdomen  is nondistended, soft and nontender. No organomegaly or masses felt. Normal bowel sounds heard.  Obese and pendulous. Central nervous system: Alert and oriented.  Right eye ptosis.  Complete loss of extraocular movements in all directions. Extremities: Mostly normal.  She has weaker hip flexion on left hip 4/5.  Motor and sensory exams are normal. Skin: No rashes, lesions or ulcers Psychiatry: Judgement and insight appear normal. Mood & affect appropriate.     Data Reviewed: I have personally reviewed following labs and imaging studies  CBC: Recent Labs  Lab 03/27/19 1705  WBC 5.8  HGB 10.0*  HCT 33.9*  MCV 87.6  PLT 448   Basic Metabolic Panel: Recent Labs  Lab 03/27/19 1705  NA 137  K 3.5  CL 97*  CO2 23  GLUCOSE 98  BUN 16  CREATININE 0.56  CALCIUM 10.1   GFR: Estimated Creatinine Clearance: 90.5 mL/min (by C-G formula based on SCr of 0.56 mg/dL). Liver Function Tests: No results for input(s): AST, ALT, ALKPHOS, BILITOT, PROT, ALBUMIN in the last 168 hours. No results for input(s): LIPASE, AMYLASE in the last 168 hours. No results for input(s): AMMONIA in the last 168 hours. Coagulation Profile: No results for input(s): INR, PROTIME in the last 168 hours. Cardiac Enzymes: No results for input(s): CKTOTAL, CKMB, CKMBINDEX, TROPONINI in the last 168 hours. BNP (last 3 results) No results for input(s): PROBNP in the last 8760 hours. HbA1C: No results for input(s): HGBA1C in the last 72 hours. CBG: No results for input(s): GLUCAP in the last 168 hours. Lipid Profile: No results for input(s): CHOL, HDL, LDLCALC, TRIG, CHOLHDL, LDLDIRECT in the last 72 hours. Thyroid Function Tests: No results for input(s): TSH, T4TOTAL, FREET4, T3FREE, THYROIDAB in the last 72 hours. Anemia Panel: No results for input(s): VITAMINB12, FOLATE, FERRITIN, TIBC, IRON, RETICCTPCT in the last 72 hours. Sepsis Labs: No results for input(s): PROCALCITON, LATICACIDVEN in the last 168  hours.  Recent Results (from the past 240 hour(s))  Novel Coronavirus, NAA (hospital order; send-out to ref lab)     Status: None   Collection Time: 03/21/19  7:06 AM   Specimen: Nasopharyngeal Swab; Respiratory  Result Value Ref Range Status   SARS-CoV-2, NAA NOT DETECTED NOT DETECTED Final    Comment: (NOTE) This test was developed and its performance characteristics determined by [employer]. This test has not been FDA cleared or approved. This test has been authorized by FDA under an Emergency Use Authorization (EUA). This test is only authorized for the duration of time the declaration that circumstances exist justifying the authorization of the emergency use of in vitro diagnostic tests for detection of SARS-CoV-2 virus and/or diagnosis of COVID-19 infection under section 564(b)(1) of the Act, 21 U.S.C. 185UDJ-4(H)(7), unless the authorization is terminated or revoked sooner. When diagnostic testing is negative, the possibility of a false negative result should be considered in the context of a patient's recent exposures and the presence of clinical signs and symptoms consistent with COVID-19. An individual without symptoms of COVID-19 and who is not shedding  SARS-CoV-2 virus would expect to have a negative (not detected) result in this assay. Performed  At: [facility] [place], [place] [id number] Rush Farmer MD EB:[id number]    [place]  Final    Comment: Performed at [facility], [address], [place], [place] [address]         Radiology Studies: Ct Head Wo Contrast  Result Date: 03/27/2019 CLINICAL DATA:  Initial evaluation for right-sided ptosis. EXAM: CT HEAD WITHOUT CONTRAST TECHNIQUE: Contiguous axial images were obtained from the base of the skull through the vertex without intravenous contrast. COMPARISON:  None. FINDINGS: Brain: Age-related cerebral atrophy with mild chronic small vessel ischemic  disease. No acute intracranial hemorrhage. No acute large vessel territory infarct. No mass lesion, midline shift or mass effect. No hydrocephalus. No extra-axial fluid collection. Vascular: No hyperdense vessel. Scattered vascular calcifications noted within the carotid siphons. Skull: Scalp soft tissues and calvarium within normal limits. Sinuses/Orbits: Globes normal soft tissues demonstrate no acute finding. Layering fluid noted within left sphenoid sinus. Paranasal sinuses are otherwise clear. No mastoid effusion. Other: None. IMPRESSION: 1. No acute intracranial abnormality. 2. Mild age-related cerebral atrophy with chronic microvascular ischemic disease. 3. Acute left sphenoid sinusitis. Electronically Signed   By: Jeannine Boga M.D.   On: 03/27/2019 21:40   Mr Thoracic Spine W Wo Contrast  Result Date: 03/27/2019 CLINICAL DATA:  Thoracic spine pain for approximately 2 weeks. History of multiple falls. EXAM: MRI THORACIC WITHOUT AND WITH CONTRAST TECHNIQUE: Multiplanar and multiecho pulse sequences of the thoracic spine were obtained without and with intravenous contrast. CONTRAST:  10 cc Gadavist IV. COMPARISON:  None. FINDINGS: MRI THORACIC SPINE FINDINGS Alignment:  Maintained. Vertebrae: There is abnormal marrow signal and enhancement in all imaged vertebral bodies consistent with extensive metastatic disease. Tumor extends into the posterior elements at multiple levels. Mild biconcave compression fracture of L1 demonstrates vertebral body height loss of approximately 35%. There may be a very mild compression fracture of T9. Cord:  Demonstrates normal signal. Paraspinal and other soft tissues: The patient has a mass in the left chest measuring approximately 9 cm transverse by 9 cm AP 10 cm craniocaudal. The lesion extends posteriorly out of the chest 3 ribs into the soft tissues of the left back including left chest wall and left paraspinous muscles. There is destruction of left ribs at the  site of the lesion. Disc levels: T9: Tumor in the pedicles and vertebral body extends into the ventral and lateral epidural spaces effacing CSF about the cord. Tumor deposit measures approximately 2.5 cm craniocaudal by 2 cm transverse by 0.7 cm AP. A large tumor deposit extends from the level of the left T12-L1 foramen to the L1-2 disc interspace. This tumor in falls left paraspinous muscles and extends into the left foramina at T12-L1 and L1-2. It measures approximately 5 cm AP by 4 cm transverse by 5 cm craniocaudal. Tumor deflects the cord to the right and deforms the left side of the cord. It encases the left exiting nerve roots at T12-L1 and L1-2. IMPRESSION: Diffuse osseous metastatic disease secondary to a large left chest mass which extends into the posterior chest wall. Epidural tumor deposits centered at T9 and L1 as described above result in mass effect on the cord and impact exiting nerve roots. These results were called by telephone at the time of interpretation on 03/27/2019 at 1:45 pm to Dr. Derek Jack , who verbally acknowledged these results. Electronically Signed   By: Marcello Moores  Dalessio M.D.   On: 03/27/2019 14:18   Dg Chest Port 1 View  Result Date: 03/27/2019 CLINICAL DATA:  Metastatic cancer EXAM: PORTABLE CHEST 1 VIEW COMPARISON:  MRI 03/27/2019, chest x-ray 01/17/2011 FINDINGS: Right lung is clear. Diffuse opacity in the left thorax. Borderline cardiomegaly. No pneumothorax. Poorly visible left sixth rib. IMPRESSION: Diffuse opacity within the left thorax, may reflect combination of pleural effusion/disease and underlying lung consolidation or possible mass, given findings on recent spine MRI. Poorly visible left sixth rib, either resected or due to bony destructive change from skeletal metastatic disease. Chest CT is suggested for further evaluation. Electronically Signed   By: Donavan Foil M.D.   On: 03/27/2019 22:38        Scheduled Meds: Continuous Infusions: .  dextrose 5 % and 0.9 % NaCl with KCl 20 mEq/L 100 mL/hr at 03/28/19 0542     LOS: 1 day    Time spent: 35 minutes    Barb Merino, MD [facility] Pager [phone]  If 7PM-7AM, please contact night-coverage [email or web address] Password [facility] 03/28/2019, 10:51 AM

## 2019-03-28 NOTE — Progress Notes (Signed)
[facility] is here to transport the patient to [facility]. 800cc drained from foley bag. Tele has been removed. Pt has no IV access; nurse verified this was okay to transfer. Report has been given to [facility]. [place]

## 2019-03-28 NOTE — Progress Notes (Signed)
Received secure email from Shona Simpson, PA-C requesting this RN arrange [facility] transportation for this patient. Instructed to have patient present for 1145 simulation. Phoned Roe Rutherford, RN on [address] [place] at [facility]. Merleen Nicely, RN confirmed the patient is stable for transport and agreed to call report to [place]. Phoned Cassie @ [facility] immediately after at 1059 to make transportation arrangements. Informed simulation staff that [facility] plans to have patient here as close to 1145 as possible. Understand from [place] there is intent to transfer patient from [facility] to [place]. Phoned bed placement to confirm. Patient placement had AD phone this RN. AD called to inform this RN that bed 1401 on [address] has been assigned. Simulation staff informed of this finding. Phoned nursing secretary on [place] at [place] who confirmed the nurse is aware the patient is coming and understands report will be called from [place] , [place] on [place] at [place].

## 2019-03-28 NOTE — Progress Notes (Signed)
  Radiation Oncology         ([phone]) [phone] ________________________________  Name: Brandi Rodriguez MRN: [id number]  Date: 03/28/2019  DOB: 04-Mar-1961  SIMULATION AND TREATMENT PLANNING NOTE    ICD-10-CM   1. Metastatic cancer to spine [facility])  C79.51     DIAGNOSIS:  58 y.o. patient with T9 and L2 metastasis from primary left lower lung cancer  NARRATIVE:  The patient was brought to the [facility].  Identity was confirmed.  All relevant records and images related to the planned course of therapy were reviewed.  The patient freely provided informed written consent to proceed with treatment after reviewing the details related to the planned course of therapy. The consent form was witnessed and verified by the simulation staff.  Then, the patient was set-up in a stable reproducible  supine position for radiation therapy.  CT images were obtained.  Surface markings were placed.  The CT images were loaded into the planning software.  Then the target and avoidance structures were contoured including kidneys.  Treatment planning then occurred.  The radiation prescription was entered and confirmed.  Then, I designed and supervised the construction of a total of 3 medically necessary complex treatment devices with VacLoc positioner and 2 MLCs to shield kidneys.  I have requested : 3D Simulation  I have requested a DVH of the following structures: Left Kidney, Right Kidney and target.  PLAN:  The patient will receive 30 Gy in 10 fractions.  ________________________________  Sheral Apley Tammi Klippel, M.D.

## 2019-03-28 NOTE — Progress Notes (Signed)
  Radiation Oncology         (336) [phone] ________________________________  Name: Brandi Rodriguez MRN: [id number]  Date: 03/27/2019  DOB: [birth year]  Chart Note:  I was contacted to see this patient in consultation.  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  She was seen in neurosurgery by Dr. Zada Finders.  According to Dr. Venetia Constable this is a 58 y.o. woman that presents with falls and left leg weakness. . She recently had a biopsy performed for newly diagnosed metastatic disease, per notes it sounds like undifferentiated carcinoma was diagnosed. She is here for new leg weakness and falls that were noted at her outpatient oncology appointment. She remembers 4 falls for me - once where she rolled off the couch, once when she tripped going up stairs, and two other ones that she doesn't remember details of, but does not remember or currently feel like she is falling to the left. Subjectively, she feels like she has some diffuse numbness in the legs, no change in bowel function, she is unsure about bladder function but she does have . No new weakness, numbness, or parasthesias, no recent change in bowel or bladder function.  He indicated that no neurosurgical intervention was indicated at this time.  Recent MRI shows spinal canal involvement at T9 and to a greater extent at L1  The MRI also demonstrates a large left lung mass with chest wall invasion.  This patient may benefit from palliative radiotherapy to prevent neurologic injury at T9 and L1.  She also may benefit from palliative radiation to the left chest wall/lung tumor.  Would recommend transfer to [facility] long hospital for initiation of radiation therapy.   ________________________________  Sheral Apley. Tammi Klippel, M.D.

## 2019-03-28 NOTE — Plan of Care (Signed)

## 2019-03-28 NOTE — Progress Notes (Signed)
Patient arrived in the unit at 44am from [facility] ED, escorted by [employer], alert and oriented x3, denied of pain, situated comfortably in a bed, initiated Telemetry monitor box 17, all safety and comfort measures are in placed, and will continue to monitor closely.

## 2019-03-28 NOTE — Consult Note (Signed)
Neurosurgery Consultation  Reason for Consult: left leg weakness Referring Physician: Ghimire  CC: Falls  HPI: This is a 58 y.o. woman that presents with falls and left leg weakness. . She recently had a biopsy performed for newly diagnosed metastatic disease, per notes it sounds like undifferentiated carcinoma was diagnosed. She is here for new leg weakness and falls that were noted at her outpatient oncology appointment. She remembers 4 falls for me - once where she rolled off the couch, once when she tripped going up stairs, and two other ones that she doesn't remember details of, but does not remember or currently feel like she is falling to the left. Subjectively, she feels like she has some diffuse numbness in the legs, no change in bowel function, she is unsure about bladder function but she does have a foley in.    ROS: A 14 point ROS was performed and is negative except as noted in the HPI.   PMHx:  Past Medical History:  Diagnosis Date  . Arthritis    FamHx:  Family History  Problem Relation Age of Onset  . Arthritis Mother   . Heart disease Father   . Heart disease Sister   . Arthritis Brother   . Heart disease Sister    SocHx:  reports that she quit smoking about 10 years ago. Her smoking use included cigarettes. She has a 15.00 pack-year smoking history. She has never used smokeless tobacco. She reports previous alcohol use. She reports that she does not use drugs.  Exam: Vital signs in last 24 hours: Temp:  [97.8 F (36.6 C)-99 F (37.2 C)] 97.8 F (36.6 C) (06/11 0400) Pulse Rate:  [77-123] 98 (06/11 0400) Resp:  [18-33] 24 (06/11 0400) BP: (97-137)/(58-99) 124/88 (06/11 0400) SpO2:  [88 %-100 %] 96 % (06/11 0400) Weight:  [100.2 kg-100.7 kg] 100.2 kg (06/11 0400) General: Awake, alert, cooperative, lying in bed in NAD Head: normocephalic and atruamatic HEENT: neck supple Pulmonary: breathing supplemental O2 comfortably, no evidence of increased work of  breathing Cardiac: RRR Abdomen: obese S NT ND Extremities: warm and well perfused x4 Neuro: AOx3, PERRL, +R dense ptosis with complete loss of extraocular movements in all directions, FS and symmetrically sensate Strength diffusely 4+/5 x4, 4/5 in left hip flexor, non-dermatomal diffuse numbness in the bilateral legs, sharply demarcated at the inguinal ligament Reflexes diffusely 1+, toes downgoing, no hoffman's, no clonus   Assessment and Plan: 58 y.o. woman with unknown primary and severe diffuse metastatic disease, exam notable for some left hip flexor weakness. MRI T-spine personally reviewed, it shows diffuse bony metastases, L1 pathologic compression fracture, large left thoracic mass with rib destruction, epidural tumor is worst at T9 ventrally and T12-L1 on the left due to encroachment through the foramen of a large paraspinal / psoas mass.   -I think that her left HF weakness is likely due to the large left psoas mass. Her epidural tumor is all on the left, which in the thoracic spine would usually cause right leg monoparesis. She has no signs of hyper-reflexia or cord compression on exam. I told her I would discuss with RT to see their comfort level about treating this and she said she really does not want surgery.   -no acute neurosurgical intervention indicated at this time, recommend rad onc consultation, happy to discuss with whomever is on call with rad onc to discuss treatment plan  -her right eye ptosis is concerning for a possible cavernous sinus localization due to the complete  loss of extra-ocular motor function, CTH does not show any obvious mass. When appropriate, will likely need an MRI brain w/wo contrast.   -please call with any concerns or questions  Judith Part, MD 03/28/19 9:04 AM [facility]

## 2019-03-29 ENCOUNTER — Ambulatory Visit
Admit: 2019-03-29 | Discharge: 2019-03-29 | Disposition: A | Discharge status: Home / Self Care | Payer: BC Managed Care – PPO | Attending: Radiation Oncology | Admitting: Radiation Oncology

## 2019-03-29 ENCOUNTER — Inpatient Hospital Stay (HOSPITAL_COMMUNITY): Payer: BC Managed Care – PPO

## 2019-03-29 DIAGNOSIS — M7989 Other specified soft tissue disorders: Secondary | ICD-10-CM

## 2019-03-29 LAB — HIV ANTIBODY (ROUTINE TESTING W REFLEX): HIV Screen 4th Generation wRfx: NONREACTIVE

## 2019-03-29 LAB — NOVEL CORONAVIRUS, NAA (HOSP ORDER, SEND-OUT TO REF LAB; TAT 18-24 HRS): SARS-CoV-2, NAA: NOT DETECTED

## 2019-03-29 MED ORDER — PANTOPRAZOLE SODIUM 40 MG PO TBEC: 40.0000 mg | DELAYED_RELEASE_TABLET | Freq: Every day | ORAL | 0 refills | Status: AC

## 2019-03-29 MED ORDER — DEXAMETHASONE 4 MG PO TABS: 4.0000 mg | ORAL_TABLET | Freq: Four times a day (QID) | ORAL | 0 refills | Status: DC

## 2019-03-29 MED ORDER — ONDANSETRON HCL 4 MG PO TABS: 4.0000 mg | ORAL_TABLET | Freq: Four times a day (QID) | ORAL | 0 refills | Status: AC | PRN

## 2019-03-29 MED ORDER — AMLODIPINE BESYLATE 5 MG PO TABS: 5.0000 mg | ORAL_TABLET | Freq: Every day | ORAL | 11 refills | Status: DC

## 2019-03-29 MED ORDER — POLYETHYLENE GLYCOL 3350 17 G PO PACK: 17.0000 g | PACK | Freq: Every day | ORAL | 0 refills | Status: AC | PRN

## 2019-03-29 NOTE — Discharge Summary (Addendum)
Physician Discharge Summary  Brandi Rodriguez DTO:[id number] DOB: 01/03/1961 DOA: 03/27/2019  PCP: Manon Hilding, MD  Admit date: 03/27/2019 Discharge date: 03/29/2019  Admitted From: Home Disposition:  Home  Recommendations for Outpatient Follow-up:  1. Follow up with PCP in 1 week 2. Follow-up with medical oncology and radiation oncology as scheduled 3. Please follow up on the following pending results: Pathology from lymph node biopsy 4. Follow-up on patient's blood pressure, started on amlodipine 5 mg p.o. daily at discharge.  Home Health: Yes, RN, PT Equipment/Devices: Wheelchair, 3 in 1 bedside commode  Discharge Condition: Stable CODE STATUS: Full code Diet recommendation: Regular diet  History of present illness:  Brandi Rodriguez a 58 y.o.femalewith medical history significant formetastatic cancer who was sent to the [facility] ED from the cancer center with reports of falls and lower extremity weakness. Patient reported falls 3 times over the past 2 days. She reported lower extremity weakness left worse than right that started one day PTAwith pain in her back also. One week prior she noticed that her right eyelid drooped.   She is currently undergoing work-upforher metastatic cancer,after MRI lumbar spine5/15/20showed innumerable bony lesions in the low presacral region,with epidural tumor at L5,sided epidural and paravertebral tumor at L1 involving T12-L1. She hadaxillarylymph node biopsy 03/25/2019.Pathology report is pending but preliminary results consistent with poorly differentiated carcinoma.    Patient followed up with her oncologist Dr. Delton Coombes , stat MRI of the thoracic spine showed diffuse osseous metastatic disease secondary to large left chest wall mass,epidural tumor deposit centered at T9 and L1 subsequently transferred [place], with recommendation for transfer to [facility] with neurosurgery and radiationoncology  consultation.  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  Also reported right eyelid drooping and dyspnea.  MRI of the lower thoracic and lumbar spine and notable for innumerable bony lesions with epidural tumor at L5 and paravertebral tumor at L1 involving the nerve root of T12-L1.  Patient underwent lymph node biopsy with preliminary results consistent with poorly differentiated carcinoma.  Recent MRI of her thoracic spine notable for diffuse osseous metastatic disease secondary to a large chest wall mass with epidural tumor at T9 and L1 with mass-effect on the spinal cord and exiting nerve roots.  Patient was evaluated by neurosurgery who recommended no acute neurosurgical intervention at this time with further recommendations of radiation therapy.  Patient started on radiation therapy on 03/28/2019 with plan 10 fractions.  Will continue on dexamethasone 4 mg p.o. every 6 hours per radiation oncology.  Continue follow-up with your primary oncologist, Dr. Delton Coombes.  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  Likely secondary to metastatic cancer and large left chest mass.  CT head negative for acute abnormality.  Surgery believes this is possibly concerning for cavernous sinus localization due to complete loss of extraocular motor function.  Recommend outpatient MR brain with and without contrast if indicated.  Right foot edema Vascular duplex venous ultrasound right lower extremity negative for DVT.  Dyspnea Hypoxia Initially requiring up to 3 L nasal cannula due to shortness of breath and desaturation.  Etiology likely from her large chest mass.  Her oxygen was removed with oxygen saturation at 89%, unfortunately does not meet criteria for home oxygen at this time.  Weakness/gait disturbance Patient with significant disability secondary to her widely metastatic  disease with several spinal fractures and spinal cord impingement without central cord  compression.  Was evaluated by physical therapy with recommendations of home health with wheelchair and 3 in 1 commode.  Acute urinary retention Was noted to have difficulty urinating with retained urine on bladder scan.  Etiology likely from her metastatic spinal disease process with slight cord and nerve root compression.  Foley catheter was placed.  Patient will maintain this Foley catheter on discharge.  Essential hypertension Patient with noted elevated systolic blood pressures between 145-180 during hospitalization.  Started on amlodipine 5 mg p.o. daily.  Please follow-up blood pressures and adjust antihypertensive regimen if warranted.  Discharge Diagnoses:  Principal Problem:   Metastatic cancer to spine [facility]) Active Problems:   Falls    Discharge Instructions  Discharge Instructions    Call MD for:  difficulty breathing, headache or visual disturbances   Complete by: As directed    Call MD for:  extreme fatigue   Complete by: As directed    Call MD for:  persistant dizziness or light-headedness   Complete by: As directed    Call MD for:  persistant nausea and vomiting   Complete by: As directed    Call MD for:  severe uncontrolled pain   Complete by: As directed    Call MD for:  temperature >100.4   Complete by: As directed    Diet - low sodium heart healthy   Complete by: As directed    Increase activity slowly   Complete by: As directed      Allergies as of 03/29/2019      Reactions   Hydrocodone Other (See Comments)   Makes heart pound      Medication List    TAKE these medications   ALPRAZolam 0.5 MG tablet Commonly known as: XANAX Take 0.5 mg by mouth 2 (two) times daily as needed for anxiety.   dexamethasone 4 MG tablet Commonly known as: DECADRON Take 1 tablet (4 mg total) by mouth every 6 (six) hours for 20 days.   GERITOL PO Take 1 tablet by mouth daily.    ibuprofen 800 MG tablet Commonly known as: ADVIL Take 800 mg by mouth 4 (four) times daily.   ondansetron 4 MG tablet Commonly known as: ZOFRAN Take 1 tablet (4 mg total) by mouth every 6 (six) hours as needed for nausea.   pantoprazole 40 MG tablet Commonly known as: PROTONIX Take 1 tablet (40 mg total) by mouth daily. Start taking on: March 30, 2019   polyethylene glycol 17 g packet Commonly known as: MIRALAX / GLYCOLAX Take 17 g by mouth daily as needed for mild constipation.   traMADol 50 MG tablet Commonly known as: Ultram Take 1 tablet (50 mg total) by mouth every 6 (six) hours as needed.   venlafaxine XR 75 MG 24 hr capsule Commonly known as: EFFEXOR-XR Take 75 mg by mouth at bedtime.   vitamin C 500 MG tablet Commonly known as: ASCORBIC ACID Take 500 mg by mouth daily.            Durable Medical Equipment  (From admission, onward)         Start     Ordered   03/29/19 1310  For home use only DME standard manual wheelchair with seat cushion  Once    Comments: Patient suffers from metastatic cancer to the spine causing impingement on the spinal cord which impairs their ability to perform daily activities like ambulation in the home to include inability to use other assistive devices such as a cane, walker, Rollator in  the home.  A walker will not resolve issue with performing activities of daily living. A wheelchair will allow patient to safely perform daily activities. Patient can safely propel the wheelchair in the home or has a caregiver who can provide assistance. Length of need 12 months . Accessories: elevating leg rests (ELRs), wheel locks, extensions and anti-tippers.   03/29/19 1312   03/29/19 1308  For home use only DME 3 n 1  Once     03/29/19 1312         Follow-up Information    Sasser, Silvestre Moment, MD. Call in 1 week(s).   Specialty: Family Medicine Contact information: [place] [address] [phone]        Derek Jack,  MD. Schedule an appointment as soon as possible for a visit in 1 week(s).   Specialty: Hematology Contact information: [place] [address] [phone]        [facility] Follow up.   Specialty: [place] Why: [facility] nursing/physical therapy Contact information: [address] [address] [facility] Follow up.   Why: w/c, bedside commode Contact information: [address] [place] [address] [phone]          Allergies  Allergen Reactions  . Hydrocodone Other (See Comments)    Makes heart pound    Consultations:  Radiation oncology, Dr. Tammi Klippel  Neurosurgery, Dr. Zada Finders   Procedures/Studies: Ct Head Wo Contrast  Result Date: 03/27/2019 CLINICAL DATA:  Initial evaluation for right-sided ptosis. EXAM: CT HEAD WITHOUT CONTRAST TECHNIQUE: Contiguous axial images were obtained from the base of the skull through the vertex without intravenous contrast. COMPARISON:  None. FINDINGS: Brain: Age-related cerebral atrophy with mild chronic small vessel ischemic disease. No acute intracranial hemorrhage. No acute large vessel territory infarct. No mass lesion, midline shift or mass effect. No hydrocephalus. No extra-axial fluid collection. Vascular: No hyperdense vessel. Scattered vascular calcifications noted within the carotid siphons. Skull: Scalp soft tissues and calvarium within normal limits. Sinuses/Orbits: Globes normal soft tissues demonstrate no acute finding. Layering fluid noted within left sphenoid sinus. Paranasal sinuses are otherwise clear. No mastoid effusion. Other: None. IMPRESSION: 1. No acute intracranial abnormality. 2. Mild age-related cerebral atrophy with chronic microvascular ischemic disease. 3. Acute left sphenoid sinusitis. Electronically Signed   By: Jeannine Boga M.D.   On: 03/27/2019 21:40   Mr Thoracic Spine W Wo Contrast  Result Date:  03/27/2019 CLINICAL DATA:  Thoracic spine pain for approximately 2 weeks. History of multiple falls. EXAM: MRI THORACIC WITHOUT AND WITH CONTRAST TECHNIQUE: Multiplanar and multiecho pulse sequences of the thoracic spine were obtained without and with intravenous contrast. CONTRAST:  10 cc Gadavist IV. COMPARISON:  None. FINDINGS: MRI THORACIC SPINE FINDINGS Alignment:  Maintained. Vertebrae: There is abnormal marrow signal and enhancement in all imaged vertebral bodies consistent with extensive metastatic disease. Tumor extends into the posterior elements at multiple levels. Mild biconcave compression fracture of L1 demonstrates vertebral body height loss of approximately 35%. There may be a very mild compression fracture of T9. Cord:  Demonstrates normal signal. Paraspinal and other soft tissues: The patient has a mass in the left chest measuring approximately 9 cm transverse by 9 cm AP 10 cm craniocaudal. The lesion extends posteriorly out of the chest 3 ribs into the soft tissues of the left back including left chest wall and left paraspinous muscles. There is destruction of left  ribs at the site of the lesion. Disc levels: T9: Tumor in the pedicles and vertebral body extends into the ventral and lateral epidural spaces effacing CSF about the cord. Tumor deposit measures approximately 2.5 cm craniocaudal by 2 cm transverse by 0.7 cm AP. A large tumor deposit extends from the level of the left T12-L1 foramen to the L1-2 disc interspace. This tumor in falls left paraspinous muscles and extends into the left foramina at T12-L1 and L1-2. It measures approximately 5 cm AP by 4 cm transverse by 5 cm craniocaudal. Tumor deflects the cord to the right and deforms the left side of the cord. It encases the left exiting nerve roots at T12-L1 and L1-2. IMPRESSION: Diffuse osseous metastatic disease secondary to a large left chest mass which extends into the posterior chest wall. Epidural tumor deposits centered at T9 and L1  as described above result in mass effect on the cord and impact exiting nerve roots. These results were called by telephone at the time of interpretation on 03/27/2019 at 1:45 pm to Dr. Derek Jack , who verbally acknowledged these results. Electronically Signed   By: Inge Rise M.D.   On: 03/27/2019 14:18   Dg Chest Port 1 View  Result Date: 03/27/2019 CLINICAL DATA:  Metastatic cancer EXAM: PORTABLE CHEST 1 VIEW COMPARISON:  MRI 03/27/2019, chest x-ray 01/17/2011 FINDINGS: Right lung is clear. Diffuse opacity in the left thorax. Borderline cardiomegaly. No pneumothorax. Poorly visible left sixth rib. IMPRESSION: Diffuse opacity within the left thorax, may reflect combination of pleural effusion/disease and underlying lung consolidation or possible mass, given findings on recent spine MRI. Poorly visible left sixth rib, either resected or due to bony destructive change from skeletal metastatic disease. Chest CT is suggested for further evaluation. Electronically Signed   By: Donavan Foil M.D.   On: 03/27/2019 22:38   Vas [place] Lower Extremity Venous (dvt)  Result Date: 03/29/2019  Lower Venous Study Indications: Swelling.  Comparison Study: No prior studies. Performing Technologist: Oliver Hum RVT  Examination Guidelines: A complete evaluation includes B-mode imaging, spectral Doppler, color Doppler, and power Doppler as needed of all accessible portions of each vessel. Bilateral testing is considered an integral part of a complete examination. Limited examinations for reoccurring indications may be performed as noted.  +---------+---------------+---------+-----------+----------+-------+ RIGHT    CompressibilityPhasicitySpontaneityPropertiesSummary +---------+---------------+---------+-----------+----------+-------+ CFV      Full           Yes      Yes                          +---------+---------------+---------+-----------+----------+-------+ SFJ      Full                                                  +---------+---------------+---------+-----------+----------+-------+ FV Prox  Full                                                 +---------+---------------+---------+-----------+----------+-------+ FV Mid   Full                                                 +---------+---------------+---------+-----------+----------+-------+  FV DistalFull                                                 +---------+---------------+---------+-----------+----------+-------+ PFV      Full                                                 +---------+---------------+---------+-----------+----------+-------+ POP      Full           Yes      Yes                          +---------+---------------+---------+-----------+----------+-------+ PTV      Full                                                 +---------+---------------+---------+-----------+----------+-------+ PERO     Full                                                 +---------+---------------+---------+-----------+----------+-------+   +----+---------------+---------+-----------+----------+-------+ LEFTCompressibilityPhasicitySpontaneityPropertiesSummary +----+---------------+---------+-----------+----------+-------+ CFV Full           Yes      Yes                          +----+---------------+---------+-----------+----------+-------+     Summary: Right: There is no evidence of deep vein thrombosis in the lower extremity. No cystic structure found in the popliteal fossa. Left: No evidence of common femoral vein obstruction.  *See table(s) above for measurements and observations.    Preliminary       Subjective: Patient seen and examined at bedside this morning, awaiting radiation therapy this afternoon.  Continues on supplemental oxygen.  No other specific complaints at this time.  Wishes to discharge home for the weekend and will return outpatient for further radiation  therapy.  Oxygen was discontinued with SPO2 remaining 89%, so does not qualify for home O2.  Seen by PT with recommendation of home health PT with wheelchair and 3 in 1 bedside commode.  Patient denies headache, no chest pain, no palpitations, no abdominal pain, no nausea/vomiting/diarrhea.  No acute events overnight per nursing staff.   Discharge Exam: Vitals:   03/29/19 0446 03/29/19 1540  BP: (!) 155/81   Pulse: 91 97  Resp: (!) 24 (!) 24  Temp: 98.1 F (36.7 C) 98.6 F (37 C)  SpO2: 93% 94%   Vitals:   03/28/19 1841 03/28/19 1956 03/29/19 0446 03/29/19 1540  BP: (!) 160/79 (!) 170/79 (!) 155/81   Pulse: (!) 102 96 91 97  Resp: (!) 25 (!) 22 (!) 24 (!) 24  Temp: 99.1 F (37.3 C) 98.5 F (36.9 C) 98.1 F (36.7 C) 98.6 F (37 C)  TempSrc: Oral Oral Oral Oral  SpO2: 96% 96% 93% 94%  Weight:      Height:        General: Pt is alert, awake, not in  acute distress, chronically ill in appearance Cardiovascular: RRR, S1/S2 +, no rubs, no gallops Respiratory: CTA bilaterally, no wheezing, no rhonchi Abdominal: Soft, NT, ND, bowel sounds + Extremities: no edema, no cyanosis, muscle strength with decreased left hip flexion 4/5.    The results of significant diagnostics from this hospitalization (including imaging, microbiology, ancillary and laboratory) are listed below for reference.     Microbiology: Recent Results (from the past 240 hour(s))  Novel Coronavirus, NAA (hospital order; send-out to ref lab)     Status: None   Collection Time: 03/21/19  7:06 AM   Specimen: Nasopharyngeal Swab; Respiratory  Result Value Ref Range Status   SARS-CoV-2, NAA NOT DETECTED NOT DETECTED Final    Comment: (NOTE) This test was developed and its performance characteristics determined by [employer]. This test has not been FDA cleared or approved. This test has been authorized by FDA under an Emergency Use Authorization (EUA). This test is only authorized for the duration of  time the declaration that circumstances exist justifying the authorization of the emergency use of in vitro diagnostic tests for detection of SARS-CoV-2 virus and/or diagnosis of COVID-19 infection under section 564(b)(1) of the Act, 21 U.S.C. 382NKN-3(Z)(7), unless the authorization is terminated or revoked sooner. When diagnostic testing is negative, the possibility of a false negative result should be considered in the context of a patient's recent exposures and the presence of clinical signs and symptoms consistent with COVID-19. An individual without symptoms of COVID-19 and who is not shedding SARS-CoV-2 virus would expect to have a negative (not detected) result in this assay. Performed  At: [facility] [place], [place] [id number] Rush Farmer MD KW:[id number]    [place]  Final    Comment: Performed at [facility], [address] [place], [place] [address]  Novel Coronavirus,NAA,(SEND-OUT TO REF LAB - TAT 24-48 hrs); Hosp Order     Status: None   Collection Time: 03/27/19  6:21 PM   Specimen: Nasopharyngeal Swab; Respiratory  Result Value Ref Range Status   SARS-CoV-2, NAA NOT DETECTED NOT DETECTED Final    Comment: (NOTE) This test was developed and its performance characteristics determined by [employer]. This test has not been FDA cleared or approved. This test has been authorized by FDA under an Emergency Use Authorization (EUA). This test is only authorized for the duration of time the declaration that circumstances exist justifying the authorization of the emergency use of in vitro diagnostic tests for detection of SARS-CoV-2 virus and/or diagnosis of COVID-19 infection under section 564(b)(1) of the Act, 21 U.S.C. 341DQQ-2(W)(9), unless the authorization is terminated or revoked sooner. When diagnostic testing is negative, the possibility of a false negative result should be considered in the context of  a patient's recent exposures and the presence of clinical signs and symptoms consistent with COVID-19. An individual without symptoms of COVID-19 and who is not shedding SARS-CoV-2 virus would expect to have a negative (not detected) result in this assay. Performed  At: [facility] [place], [place] [id number] Rush Farmer MD RD:[id number]    [place]  Final    Comment: Performed at [facility], [address] [place], [place] [address]     Labs: BNP (last 3 results) No results for input(s): BNP in the last 8760 hours. Basic Metabolic Panel: Recent Labs  Lab 03/27/19 1705  NA 137  K 3.5  CL 97*  CO2 23  GLUCOSE 98  BUN 16  CREATININE 0.56  CALCIUM 10.1   Liver Function Tests: No results for input(s): AST, ALT, ALKPHOS, BILITOT, PROT, ALBUMIN in the last 168 hours. No results for input(s): LIPASE, AMYLASE in the last 168 hours. No results for input(s): AMMONIA in the last 168 hours. CBC: Recent Labs  Lab 03/27/19 1705  WBC 5.8  HGB 10.0*  HCT 33.9*  MCV 87.6  PLT 197   Cardiac Enzymes: No results for input(s): CKTOTAL, CKMB, CKMBINDEX, TROPONINI in the last 168 hours. BNP: Invalid input(s): POCBNP CBG: No results for input(s): GLUCAP in the last 168 hours. D-Dimer No results for input(s): DDIMER in the last 72 hours. Hgb A1c No results for input(s): HGBA1C in the last 72 hours. Lipid Profile No results for input(s): CHOL, HDL, LDLCALC, TRIG, CHOLHDL, LDLDIRECT in the last 72 hours. Thyroid function studies No results for input(s): TSH, T4TOTAL, T3FREE, THYROIDAB in the last 72 hours.  Invalid input(s): FREET3 Anemia work up No results for input(s): VITAMINB12, FOLATE, FERRITIN, TIBC, IRON, RETICCTPCT in the last 72 hours. Urinalysis    Component Value Date/Time   COLORURINE YELLOW 03/27/2019 1510   APPEARANCEUR CLEAR 03/27/2019 1510   LABSPEC 1.021 03/27/2019 1510   PHURINE 5.0 03/27/2019 1510    GLUCOSEU NEGATIVE 03/27/2019 1510   HGBUR NEGATIVE 03/27/2019 1510   BILIRUBINUR NEGATIVE 03/27/2019 1510   KETONESUR NEGATIVE 03/27/2019 1510   PROTEINUR NEGATIVE 03/27/2019 1510   NITRITE NEGATIVE 03/27/2019 1510   LEUKOCYTESUR NEGATIVE 03/27/2019 1510   Sepsis Labs Invalid input(s): PROCALCITONIN,  WBC,  LACTICIDVEN Microbiology Recent Results (from the past 240 hour(s))  Novel Coronavirus, NAA (hospital order; send-out to ref lab)     Status: None   Collection Time: 03/21/19  7:06 AM   Specimen: Nasopharyngeal Swab; Respiratory  Result Value Ref Range Status   SARS-CoV-2, NAA NOT DETECTED NOT DETECTED Final    Comment: (NOTE) This test was developed and its performance characteristics determined by [employer]. This test has not been FDA cleared or approved. This test has been authorized by FDA under an Emergency Use Authorization (EUA). This test is only authorized for the duration of time the declaration that circumstances exist justifying the authorization of the emergency use of in vitro diagnostic tests for detection of SARS-CoV-2 virus and/or diagnosis of COVID-19 infection under section 564(b)(1) of the Act, 21 U.S.C. 182XHB-7(J)(6), unless the authorization is terminated or revoked sooner. When diagnostic testing is negative, the possibility of a false negative result should be considered in the context of a patient's recent exposures and the presence of clinical signs and symptoms consistent with COVID-19. An individual without symptoms of COVID-19 and who is not shedding SARS-CoV-2 virus would expect to have a negative (not detected) result in this assay. Performed  At: [facility] [place], [place] [id number] Rush Farmer MD FB:[id number]    [place]  Final    Comment: Performed at [facility], [address] [place], [place] [address]  Novel Coronavirus,NAA,(SEND-OUT TO REF LAB - TAT 24-48 hrs); Hosp  Order     Status: None   Collection Time: 03/27/19  6:21 PM   Specimen: Nasopharyngeal Swab; Respiratory  Result Value Ref Range Status   SARS-CoV-2, NAA NOT DETECTED NOT DETECTED Final    Comment: (NOTE) This test was developed and its performance characteristics determined by [employer]. This test has not been FDA cleared or approved. This test has been authorized by FDA under an Emergency Use Authorization (EUA). This test is only authorized for the duration of  time the declaration that circumstances exist justifying the authorization of the emergency use of in vitro diagnostic tests for detection of SARS-CoV-2 virus and/or diagnosis of COVID-19 infection under section 564(b)(1) of the Act, 21 U.S.C. 353GDJ-2(E)(2), unless the authorization is terminated or revoked sooner. When diagnostic testing is negative, the possibility of a false negative result should be considered in the context of a patient's recent exposures and the presence of clinical signs and symptoms consistent with COVID-19. An individual without symptoms of COVID-19 and who is not shedding SARS-CoV-2 virus would expect to have a negative (not detected) result in this assay. Performed  At: [facility] [address] [place], [place] [id number] Rush Farmer MD WL:[phone]    [place]  Final    Comment: Performed at [facility], [address], [place], [place] [address]     Time coordinating discharge: Over 30 minutes  SIGNED:    J [place] DO  [facility] 03/29/2019, 4:04 PM

## 2019-03-29 NOTE — Progress Notes (Signed)
OT Cancellation Note  Patient Details Name: Brandi Rodriguez MRN: [id number] DOB: 1961-03-09   Cancelled Treatment:    Reason Eval/Treat Not Completed: Patient at procedure or test/ unavailable  Sebastiano Luecke 03/29/2019, 2:54 PM  Lesle Chris, OTR/L Acute Rehabilitation Services [phone] WL pager [phone] office 03/29/2019

## 2019-03-29 NOTE — Progress Notes (Signed)
OT Cancellation Note  Patient Details Name: Brandi Rodriguez MRN: [id number] DOB: 23-Jan-1961   Cancelled Treatment:    Reason Eval/Treat Not Completed: Patient fatiqued after XRT. Will  check back tomorrow  Renaud Celli 03/29/2019, 3:42 PM  Lesle Chris, OTR/L Acute Rehabilitation Services [phone] WL pager [phone] office 03/29/2019

## 2019-03-29 NOTE — Progress Notes (Signed)
WEnt over all discharge information with patient.  All questions answered.  Went over all foley teaching, and sent home with supplies. Pt stated she did not want the leg bag.  Equipment delivered to room.

## 2019-03-29 NOTE — Progress Notes (Signed)
Right lower extremity venous duplex has been completed. Preliminary results can be found in CV Proc through chart review.   03/29/19 10:57 AM Carlos Levering RVT

## 2019-03-29 NOTE — TOC Transition Note (Signed)
Transition of Care [facility]) - CM/SW Discharge Note   Patient Details  Name: Brandi Rodriguez MRN: [id number] Date of Birth: 1961/06/18  Transition of Care [facility]) CM/SW Contact:  Dessa Phi, RN Phone Number: 03/29/2019, 3:45 PM   Clinical Narrative:  Patient d/c home w/[facility] rep [place] able to [place] Monday-MD notified & agree-all other [place] not able to accept or not timely enough to respond([facility]) adapt to deliver w/c,& 3n1 to rm prior d/c. Nsg notified. No further CM needs.    Final next level of care: [place] Barriers to Discharge: No Barriers Identified   Patient Goals and CMS Choice Patient states their goals for this hospitalization and ongoing recovery are:: get stronger CMS Medicare.gov Compare Post Acute Care list provided to:: Patient Choice offered to / list presented to : Patient  Discharge Placement                       Discharge Plan and Services                DME Arranged: 3-N-1, Wheelchair manual DME Agency: [facility]   Time DME Agency Contacted: [phone] Representative spoke with at DME Agency: Thedore Mins HH Arranged: RN, PT [place] Agency: [facility] Date [place]: 03/29/19 Time [place]: 1545 Representative spoke with at [place]: [place] Interventions     Readmission Risk Interventions No flowsheet data found.

## 2019-03-29 NOTE — Evaluation (Signed)
Physical Therapy Evaluation Patient Details Name: Brandi Rodriguez MRN: [id number] DOB: April 09, 1961 Today's Date: 03/29/2019   History of Present Illness  This is a 58 y.o. woman that presents with falls and left leg weakness. . She recently had a biopsy performed for newly diagnosed metastatic disease.  She has had multiple falls recently.  Clinical Impression  Pt admitted with above diagnosis. Pt currently with functional limitations due to the deficits listed below (see PT Problem List).  Pt will benefit from skilled PT to increase their independence and safety with mobility to allow discharge to the venue listed below.  Pt unsteady with transitional movements during gait and had difficulty maneuvering RW and staying within it while turning.  She is a fall risk and recommend a w/c for community distances at this time.  Also recommend BSC and HHPT.  Pt instructed to have someone with her during mobility and she verbalized understanding.     Follow Up Recommendations Home health PT;Supervision/Assistance - 24 hour;Supervision for mobility/OOB    Equipment Recommendations  Wheelchair (measurements PT);3in1 (PT)    Recommendations for Other Services       Precautions / Restrictions Precautions Precautions: Fall Restrictions Weight Bearing Restrictions: No      Mobility  Bed Mobility Overal bed mobility: Needs Assistance Bed Mobility: Rolling;Sidelying to Sit Rolling: Min guard Sidelying to sit: Mod assist       General bed mobility comments: cues for proper body mechanics.  Transfers Overall transfer level: Needs assistance Equipment used: Rolling walker (2 wheeled) Transfers: Sit to/from Stand Sit to Stand: Min guard         General transfer comment: pt able to power up with min/guard and cues for hand placement  Ambulation/Gait Ambulation/Gait assistance: Min assist;Mod assist;+2 safety/equipment Gait Distance (Feet): 20 Feet Assistive device: Rolling walker  (2 wheeled) Gait Pattern/deviations: Decreased step length - right;Decreased step length - left     General Gait Details: Pt with decreased coordination and difficulty maneuvering RW.  She required MOD A when turning to get hips fully in front of chair.  o2 95% and HR up to 130 on room air with gait.  Stairs            Wheelchair Mobility    Modified Rankin (Stroke Patients Only)       Balance Overall balance assessment: History of Falls;Needs assistance   Sitting balance-Leahy Scale: Fair     Standing balance support: Bilateral upper extremity supported Standing balance-Leahy Scale: Fair Standing balance comment: fair static and poor dynamic                             Pertinent Vitals/Pain Pain Assessment: No/denies pain(just had morphine)    Home Living Family/patient expects to be discharged to:: Private residence Living Arrangements: Spouse/significant other Available Help at Discharge: Family;Available PRN/intermittently Type of Home: House Home Access: Stairs to enter Entrance Stairs-Rails: Psychiatric nurse of Steps: 2 Home Layout: One level Home Equipment: Cane - single point;Walker - 2 wheels;Grab bars - toilet;Grab bars - tub/shower      Prior Function Level of Independence: Needs assistance   Gait / Transfers Assistance Needed: Amb with cane for about 4 weeks.  Started falling a few days ago.           Hand Dominance        Extremity/Trunk Assessment   Upper Extremity Assessment Upper Extremity Assessment: Defer to OT evaluation    Lower Extremity  Assessment Lower Extremity Assessment: Generalized weakness       Communication      Cognition Arousal/Alertness: Awake/alert Behavior During Therapy: WFL for tasks assessed/performed Overall Cognitive Status: Within Functional Limits for tasks assessed                                        General Comments General comments (skin  integrity, edema, etc.): R eye lid drooping. Pt ambulated to [facility] ~ 3 ft and was able to statically stand while being cleaned.  Then ambulated in room.    Exercises     Assessment/Plan    PT Assessment Patient needs continued PT services  PT Problem List Decreased strength;Decreased activity tolerance;Decreased balance;Decreased coordination;Decreased mobility;Decreased safety awareness;Cardiopulmonary status limiting activity       PT Treatment Interventions DME instruction;Gait training;Stair training;Functional mobility training;Balance training;Therapeutic exercise;Therapeutic activities;Neuromuscular re-education    PT Goals (Current goals can be found in the Care Plan section)  Acute Rehab PT Goals Patient Stated Goal: home PT Goal Formulation: With patient Time For Goal Achievement: 04/12/19 Potential to Achieve Goals: Fair    Frequency Min 3X/week   Barriers to discharge Inaccessible home environment stairs to enter    Co-evaluation               AM-PAC PT "6 Clicks" Mobility  Outcome Measure Help needed turning from your back to your side while in a flat bed without using bedrails?: A Little Help needed moving from lying on your back to sitting on the side of a flat bed without using bedrails?: A Little Help needed moving to and from a bed to a chair (including a wheelchair)?: A Little Help needed standing up from a chair using your arms (e.g., wheelchair or bedside chair)?: A Little Help needed to walk in hospital room?: A Little Help needed climbing 3-5 steps with a railing? : A Lot 6 Click Score: 17    End of Session Equipment Utilized During Treatment: Gait belt Activity Tolerance: Patient limited by fatigue Patient left: in chair;with call bell/phone within reach;with chair alarm set Nurse Communication: Mobility status PT Visit Diagnosis: Unsteadiness on feet (R26.81);History of falling (Z91.81);Other abnormalities of gait and mobility (R26.89)     Time: 1117(no charge for time spent on BSC)-1216 PT Time Calculation (min) (ACUTE ONLY): 59 min   Charges:   PT Evaluation $PT Eval Moderate Complexity: 1 Mod PT Treatments $Gait Training: 8-22 mins $Therapeutic Activity: 8-22 mins        Vendetta Pittinger L. Tamala Julian, [place] Pager [phone] 03/29/2019   Galen Manila 03/29/2019, 1:01 PM

## 2019-03-29 NOTE — Plan of Care (Signed)
  Problem: Education: Goal: Knowledge of General Education information will improve Description: Including pain rating scale, medication(s)/side effects and non-pharmacologic comfort measures Outcome: Progressing   Problem: Clinical Measurements: Goal: Will remain free from infection Outcome: Progressing   Problem: Clinical Measurements: Goal: Respiratory complications will improve Outcome: Progressing   Problem: Clinical Measurements: Goal: Cardiovascular complication will be avoided Outcome: Progressing   Problem: Coping: Goal: Level of anxiety will decrease Outcome: Progressing

## 2019-03-29 NOTE — Care Management (Signed)
    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  A walker will not resolve issue with performing activities of daily living. A wheelchair will allow patient to safely perform daily activities. Patient can safely propel the wheelchair in the home or has a caregiver who can provide assistance. Length of need 12 months . Accessories: elevating leg rests (ELRs), wheel locks, extensions and anti-tippers.   03/29/19 1312   03/29/19 1308  For home use only DME 3 n 1  Once     03/29/19 1312

## 2019-04-01 ENCOUNTER — Ambulatory Visit
Admission: RE | Admit: 2019-04-01 | Discharge: 2019-04-01 | Disposition: A | Discharge status: Home / Self Care | Payer: BC Managed Care – PPO | Source: Ambulatory Visit | Attending: Radiation Oncology | Admitting: Radiation Oncology

## 2019-04-01 ENCOUNTER — Other Ambulatory Visit: Payer: Self-pay

## 2019-04-01 DIAGNOSIS — Z510 Encounter for antineoplastic radiation therapy: Secondary | ICD-10-CM | POA: Diagnosis not present

## 2019-04-01 DIAGNOSIS — C7951 Secondary malignant neoplasm of bone: Secondary | ICD-10-CM | POA: Insufficient documentation

## 2019-04-01 DIAGNOSIS — C3432 Malignant neoplasm of lower lobe, left bronchus or lung: Secondary | ICD-10-CM | POA: Diagnosis not present

## 2019-04-01 DIAGNOSIS — C7931 Secondary malignant neoplasm of brain: Secondary | ICD-10-CM | POA: Diagnosis not present

## 2019-04-02 ENCOUNTER — Ambulatory Visit
Admission: RE | Admit: 2019-04-02 | Discharge: 2019-04-02 | Disposition: A | Discharge status: Home / Self Care | Payer: BC Managed Care – PPO | Source: Ambulatory Visit | Attending: Radiation Oncology | Admitting: Radiation Oncology

## 2019-04-02 ENCOUNTER — Other Ambulatory Visit: Payer: Self-pay

## 2019-04-02 DIAGNOSIS — C7951 Secondary malignant neoplasm of bone: Secondary | ICD-10-CM | POA: Diagnosis not present

## 2019-04-02 DIAGNOSIS — Z510 Encounter for antineoplastic radiation therapy: Secondary | ICD-10-CM | POA: Diagnosis not present

## 2019-04-02 DIAGNOSIS — C3432 Malignant neoplasm of lower lobe, left bronchus or lung: Secondary | ICD-10-CM | POA: Diagnosis not present

## 2019-04-02 DIAGNOSIS — C7931 Secondary malignant neoplasm of brain: Secondary | ICD-10-CM | POA: Diagnosis not present

## 2019-04-03 ENCOUNTER — Telehealth: Payer: Self-pay | Admitting: General Surgery

## 2019-04-03 ENCOUNTER — Ambulatory Visit
Admission: RE | Admit: 2019-04-03 | Discharge: 2019-04-03 | Disposition: A | Discharge status: Home / Self Care | Payer: BC Managed Care – PPO | Source: Ambulatory Visit | Attending: Radiation Oncology | Admitting: Radiation Oncology

## 2019-04-03 ENCOUNTER — Other Ambulatory Visit: Payer: Self-pay

## 2019-04-03 ENCOUNTER — Encounter: Payer: Self-pay | Admitting: Radiation Oncology

## 2019-04-03 DIAGNOSIS — C7951 Secondary malignant neoplasm of bone: Secondary | ICD-10-CM | POA: Diagnosis not present

## 2019-04-03 DIAGNOSIS — Z09 Encounter for follow-up examination after completed treatment for conditions other than malignant neoplasm: Secondary | ICD-10-CM | POA: Insufficient documentation

## 2019-04-03 DIAGNOSIS — I10 Essential (primary) hypertension: Secondary | ICD-10-CM | POA: Diagnosis not present

## 2019-04-03 DIAGNOSIS — C3432 Malignant neoplasm of lower lobe, left bronchus or lung: Secondary | ICD-10-CM | POA: Diagnosis not present

## 2019-04-03 DIAGNOSIS — C7931 Secondary malignant neoplasm of brain: Secondary | ICD-10-CM | POA: Diagnosis not present

## 2019-04-03 DIAGNOSIS — G9520 Unspecified cord compression: Secondary | ICD-10-CM | POA: Diagnosis not present

## 2019-04-03 DIAGNOSIS — Z510 Encounter for antineoplastic radiation therapy: Secondary | ICD-10-CM | POA: Diagnosis not present

## 2019-04-03 DIAGNOSIS — R531 Weakness: Secondary | ICD-10-CM | POA: Diagnosis not present

## 2019-04-03 DIAGNOSIS — M899 Disorder of bone, unspecified: Secondary | ICD-10-CM | POA: Diagnosis not present

## 2019-04-03 DIAGNOSIS — R590 Localized enlarged lymph nodes: Secondary | ICD-10-CM | POA: Diagnosis not present

## 2019-04-03 NOTE — Progress Notes (Signed)
To clarify, the patient was not see face to face but was discussed via telephone during her hospital consultation on 03/27/2019.     Carola Rhine, PAC

## 2019-04-03 NOTE — Telephone Encounter (Signed)
Message left with patient to call office if there are any questions

## 2019-04-04 ENCOUNTER — Encounter: Payer: Self-pay | Admitting: Radiation Oncology

## 2019-04-04 ENCOUNTER — Ambulatory Visit
Admission: RE | Admit: 2019-04-04 | Discharge: 2019-04-04 | Disposition: A | Discharge status: Home / Self Care | Payer: BC Managed Care – PPO | Source: Ambulatory Visit | Attending: Radiation Oncology | Admitting: Radiation Oncology

## 2019-04-04 ENCOUNTER — Other Ambulatory Visit: Payer: Self-pay

## 2019-04-04 ENCOUNTER — Other Ambulatory Visit: Payer: Self-pay | Admitting: Radiation Oncology

## 2019-04-04 DIAGNOSIS — C7951 Secondary malignant neoplasm of bone: Secondary | ICD-10-CM | POA: Diagnosis not present

## 2019-04-04 DIAGNOSIS — Z510 Encounter for antineoplastic radiation therapy: Secondary | ICD-10-CM | POA: Diagnosis not present

## 2019-04-04 DIAGNOSIS — C3432 Malignant neoplasm of lower lobe, left bronchus or lung: Secondary | ICD-10-CM | POA: Diagnosis not present

## 2019-04-04 DIAGNOSIS — C7931 Secondary malignant neoplasm of brain: Secondary | ICD-10-CM | POA: Diagnosis not present

## 2019-04-04 NOTE — Progress Notes (Signed)
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 She states she is taking her steroid as prescribed and has not been taking any PPI or H2 blockers to protect her stomach. She's been advised to start this. She does continue to have some mild tingling in her lower extremities but denies progression. She has not had noticeable improvement yet in her pain. She needs her PET scheduled and complains of lower extremity edema. This is seen and is bilateral. No deep calf tenderness or assymmetry is noted but this is 2+ bilaterally. I encouraged her to elevate her lower extremities and this could be from IVF she received at the hospital in addition to a side effect of the dexamethasone. She has a walker and wheelchair at home which she will use. She reports a fall earlier this week. She does have home health but no PT ordered. I'll place an order for PT to eval and treat and she may benefit from mild compression for her lower extremities. Taper instructions on her steroids will begin after she completes radiotherapy.     Carola Rhine, PAC;

## 2019-04-05 ENCOUNTER — Other Ambulatory Visit: Payer: Self-pay | Admitting: Radiation Oncology

## 2019-04-05 ENCOUNTER — Ambulatory Visit
Admission: RE | Admit: 2019-04-05 | Discharge: 2019-04-05 | Disposition: A | Discharge status: Home / Self Care | Payer: BC Managed Care – PPO | Source: Ambulatory Visit | Attending: Radiation Oncology | Admitting: Radiation Oncology

## 2019-04-05 ENCOUNTER — Other Ambulatory Visit: Payer: Self-pay

## 2019-04-05 ENCOUNTER — Other Ambulatory Visit (HOSPITAL_COMMUNITY): Payer: Self-pay | Admitting: *Deleted

## 2019-04-05 DIAGNOSIS — C7951 Secondary malignant neoplasm of bone: Secondary | ICD-10-CM | POA: Diagnosis not present

## 2019-04-05 DIAGNOSIS — C3432 Malignant neoplasm of lower lobe, left bronchus or lung: Secondary | ICD-10-CM | POA: Diagnosis not present

## 2019-04-05 DIAGNOSIS — C7931 Secondary malignant neoplasm of brain: Secondary | ICD-10-CM | POA: Diagnosis not present

## 2019-04-05 DIAGNOSIS — Z510 Encounter for antineoplastic radiation therapy: Secondary | ICD-10-CM | POA: Diagnosis not present

## 2019-04-05 MED ORDER — FUROSEMIDE 20 MG PO TABS: 20.0000 mg | ORAL_TABLET | Freq: Every day | ORAL | 3 refills | Status: AC

## 2019-04-05 NOTE — Progress Notes (Signed)
Starting Taper today 4 TID until 6/21 2 TID until 6/24 1 BID until 7/5 1/2 BID until 7/12 1/2 BID until 7/19 STOP

## 2019-04-08 ENCOUNTER — Ambulatory Visit
Admission: RE | Admit: 2019-04-08 | Discharge: 2019-04-08 | Disposition: A | Discharge status: Home / Self Care | Payer: BC Managed Care – PPO | Source: Ambulatory Visit | Attending: Radiation Oncology | Admitting: Radiation Oncology

## 2019-04-08 ENCOUNTER — Encounter: Payer: Self-pay | Admitting: Urology

## 2019-04-08 ENCOUNTER — Other Ambulatory Visit: Payer: Self-pay

## 2019-04-08 DIAGNOSIS — R531 Weakness: Secondary | ICD-10-CM

## 2019-04-08 DIAGNOSIS — G8314 Monoplegia of lower limb affecting left nondominant side: Secondary | ICD-10-CM | POA: Diagnosis not present

## 2019-04-08 DIAGNOSIS — C7951 Secondary malignant neoplasm of bone: Secondary | ICD-10-CM | POA: Diagnosis not present

## 2019-04-08 DIAGNOSIS — G9520 Unspecified cord compression: Secondary | ICD-10-CM

## 2019-04-08 DIAGNOSIS — M899 Disorder of bone, unspecified: Secondary | ICD-10-CM | POA: Diagnosis not present

## 2019-04-08 DIAGNOSIS — Z1159 Encounter for screening for other viral diseases: Secondary | ICD-10-CM | POA: Diagnosis not present

## 2019-04-08 DIAGNOSIS — C720 Malignant neoplasm of spinal cord: Secondary | ICD-10-CM | POA: Diagnosis not present

## 2019-04-08 DIAGNOSIS — J9611 Chronic respiratory failure with hypoxia: Secondary | ICD-10-CM | POA: Diagnosis not present

## 2019-04-08 DIAGNOSIS — C799 Secondary malignant neoplasm of unspecified site: Secondary | ICD-10-CM | POA: Diagnosis not present

## 2019-04-08 DIAGNOSIS — Z66 Do not resuscitate: Secondary | ICD-10-CM | POA: Diagnosis not present

## 2019-04-08 DIAGNOSIS — M8448XA Pathological fracture, other site, initial encounter for fracture: Secondary | ICD-10-CM | POA: Diagnosis not present

## 2019-04-08 DIAGNOSIS — R4182 Altered mental status, unspecified: Secondary | ICD-10-CM | POA: Diagnosis not present

## 2019-04-08 DIAGNOSIS — R0689 Other abnormalities of breathing: Secondary | ICD-10-CM | POA: Diagnosis not present

## 2019-04-08 DIAGNOSIS — N390 Urinary tract infection, site not specified: Secondary | ICD-10-CM | POA: Diagnosis not present

## 2019-04-08 DIAGNOSIS — G893 Neoplasm related pain (acute) (chronic): Secondary | ICD-10-CM | POA: Diagnosis not present

## 2019-04-08 DIAGNOSIS — C3490 Malignant neoplasm of unspecified part of unspecified bronchus or lung: Secondary | ICD-10-CM | POA: Diagnosis not present

## 2019-04-08 DIAGNOSIS — M8458XA Pathological fracture in neoplastic disease, other specified site, initial encounter for fracture: Secondary | ICD-10-CM | POA: Diagnosis not present

## 2019-04-08 DIAGNOSIS — Z960 Presence of urogenital implants: Secondary | ICD-10-CM | POA: Diagnosis not present

## 2019-04-08 DIAGNOSIS — R220 Localized swelling, mass and lump, head: Secondary | ICD-10-CM | POA: Diagnosis not present

## 2019-04-08 DIAGNOSIS — S0990XA Unspecified injury of head, initial encounter: Secondary | ICD-10-CM | POA: Diagnosis not present

## 2019-04-08 DIAGNOSIS — T83518A Infection and inflammatory reaction due to other urinary catheter, initial encounter: Secondary | ICD-10-CM | POA: Diagnosis not present

## 2019-04-08 DIAGNOSIS — D61818 Other pancytopenia: Secondary | ICD-10-CM | POA: Diagnosis not present

## 2019-04-08 DIAGNOSIS — C7A8 Other malignant neuroendocrine tumors: Secondary | ICD-10-CM | POA: Diagnosis not present

## 2019-04-08 DIAGNOSIS — H02401 Unspecified ptosis of right eyelid: Secondary | ICD-10-CM | POA: Diagnosis present

## 2019-04-08 DIAGNOSIS — F419 Anxiety disorder, unspecified: Secondary | ICD-10-CM | POA: Diagnosis present

## 2019-04-08 DIAGNOSIS — C7931 Secondary malignant neoplasm of brain: Secondary | ICD-10-CM | POA: Diagnosis not present

## 2019-04-08 DIAGNOSIS — J90 Pleural effusion, not elsewhere classified: Secondary | ICD-10-CM | POA: Diagnosis not present

## 2019-04-08 DIAGNOSIS — Y842 Radiological procedure and radiotherapy as the cause of abnormal reaction of the patient, or of later complication, without mention of misadventure at the time of the procedure: Secondary | ICD-10-CM | POA: Diagnosis present

## 2019-04-08 DIAGNOSIS — Z1612 Extended spectrum beta lactamase (ESBL) resistance: Secondary | ICD-10-CM | POA: Diagnosis not present

## 2019-04-08 DIAGNOSIS — S0993XA Unspecified injury of face, initial encounter: Secondary | ICD-10-CM | POA: Diagnosis not present

## 2019-04-08 DIAGNOSIS — R609 Edema, unspecified: Secondary | ICD-10-CM | POA: Diagnosis not present

## 2019-04-08 DIAGNOSIS — Z20828 Contact with and (suspected) exposure to other viral communicable diseases: Secondary | ICD-10-CM | POA: Diagnosis not present

## 2019-04-08 DIAGNOSIS — F329 Major depressive disorder, single episode, unspecified: Secondary | ICD-10-CM | POA: Diagnosis present

## 2019-04-08 DIAGNOSIS — R000 Tachycardia, unspecified: Secondary | ICD-10-CM | POA: Diagnosis not present

## 2019-04-08 DIAGNOSIS — C7B8 Other secondary neuroendocrine tumors: Secondary | ICD-10-CM | POA: Diagnosis not present

## 2019-04-08 DIAGNOSIS — Y846 Urinary catheterization as the cause of abnormal reaction of the patient, or of later complication, without mention of misadventure at the time of the procedure: Secondary | ICD-10-CM | POA: Diagnosis present

## 2019-04-08 DIAGNOSIS — C7A1 Malignant poorly differentiated neuroendocrine tumors: Secondary | ICD-10-CM | POA: Diagnosis not present

## 2019-04-08 DIAGNOSIS — R627 Adult failure to thrive: Secondary | ICD-10-CM | POA: Diagnosis not present

## 2019-04-08 DIAGNOSIS — R1084 Generalized abdominal pain: Secondary | ICD-10-CM | POA: Diagnosis not present

## 2019-04-08 DIAGNOSIS — I471 Supraventricular tachycardia: Secondary | ICD-10-CM | POA: Diagnosis not present

## 2019-04-08 DIAGNOSIS — R29810 Facial weakness: Secondary | ICD-10-CM | POA: Diagnosis present

## 2019-04-08 DIAGNOSIS — C969 Malignant neoplasm of lymphoid, hematopoietic and related tissue, unspecified: Secondary | ICD-10-CM | POA: Diagnosis not present

## 2019-04-08 DIAGNOSIS — R52 Pain, unspecified: Secondary | ICD-10-CM | POA: Diagnosis not present

## 2019-04-08 DIAGNOSIS — G902 Horner's syndrome: Secondary | ICD-10-CM | POA: Diagnosis present

## 2019-04-08 DIAGNOSIS — I10 Essential (primary) hypertension: Secondary | ICD-10-CM | POA: Diagnosis present

## 2019-04-08 DIAGNOSIS — C3432 Malignant neoplasm of lower lobe, left bronchus or lung: Secondary | ICD-10-CM | POA: Diagnosis not present

## 2019-04-08 DIAGNOSIS — R222 Localized swelling, mass and lump, trunk: Secondary | ICD-10-CM | POA: Diagnosis not present

## 2019-04-08 DIAGNOSIS — R630 Anorexia: Secondary | ICD-10-CM | POA: Diagnosis present

## 2019-04-08 DIAGNOSIS — R54 Age-related physical debility: Secondary | ICD-10-CM | POA: Diagnosis present

## 2019-04-08 DIAGNOSIS — Z515 Encounter for palliative care: Secondary | ICD-10-CM | POA: Diagnosis not present

## 2019-04-09 ENCOUNTER — Inpatient Hospital Stay (HOSPITAL_COMMUNITY)
Admission: EM | Admit: 2019-04-09 | Discharge: 2019-04-26 | DRG: 543 | Disposition: A | Discharge status: Hospice | Payer: BC Managed Care – PPO | Attending: Internal Medicine | Admitting: Internal Medicine

## 2019-04-09 ENCOUNTER — Inpatient Hospital Stay (HOSPITAL_COMMUNITY): Payer: BC Managed Care – PPO

## 2019-04-09 ENCOUNTER — Telehealth: Payer: Self-pay | Admitting: Radiation Oncology

## 2019-04-09 ENCOUNTER — Emergency Department (HOSPITAL_COMMUNITY): Payer: BC Managed Care – PPO

## 2019-04-09 ENCOUNTER — Other Ambulatory Visit: Payer: Self-pay

## 2019-04-09 ENCOUNTER — Encounter (HOSPITAL_COMMUNITY): Payer: Self-pay | Admitting: Emergency Medicine

## 2019-04-09 ENCOUNTER — Ambulatory Visit: Payer: BC Managed Care – PPO

## 2019-04-09 DIAGNOSIS — C799 Secondary malignant neoplasm of unspecified site: Secondary | ICD-10-CM

## 2019-04-09 DIAGNOSIS — T83518A Infection and inflammatory reaction due to other urinary catheter, initial encounter: Secondary | ICD-10-CM | POA: Diagnosis present

## 2019-04-09 DIAGNOSIS — Z1612 Extended spectrum beta lactamase (ESBL) resistance: Secondary | ICD-10-CM | POA: Diagnosis present

## 2019-04-09 DIAGNOSIS — Z6834 Body mass index (BMI) 34.0-34.9, adult: Secondary | ICD-10-CM

## 2019-04-09 DIAGNOSIS — Z791 Long term (current) use of non-steroidal anti-inflammatories (NSAID): Secondary | ICD-10-CM

## 2019-04-09 DIAGNOSIS — C7A1 Malignant poorly differentiated neuroendocrine tumors: Secondary | ICD-10-CM | POA: Diagnosis present

## 2019-04-09 DIAGNOSIS — C7951 Secondary malignant neoplasm of bone: Secondary | ICD-10-CM | POA: Diagnosis present

## 2019-04-09 DIAGNOSIS — R531 Weakness: Secondary | ICD-10-CM | POA: Diagnosis not present

## 2019-04-09 DIAGNOSIS — R54 Age-related physical debility: Secondary | ICD-10-CM | POA: Diagnosis present

## 2019-04-09 DIAGNOSIS — Z79899 Other long term (current) drug therapy: Secondary | ICD-10-CM

## 2019-04-09 DIAGNOSIS — M1990 Unspecified osteoarthritis, unspecified site: Secondary | ICD-10-CM | POA: Diagnosis present

## 2019-04-09 DIAGNOSIS — C3490 Malignant neoplasm of unspecified part of unspecified bronchus or lung: Secondary | ICD-10-CM | POA: Diagnosis present

## 2019-04-09 DIAGNOSIS — I471 Supraventricular tachycardia: Secondary | ICD-10-CM | POA: Diagnosis not present

## 2019-04-09 DIAGNOSIS — Z8249 Family history of ischemic heart disease and other diseases of the circulatory system: Secondary | ICD-10-CM

## 2019-04-09 DIAGNOSIS — Z1159 Encounter for screening for other viral diseases: Secondary | ICD-10-CM | POA: Diagnosis not present

## 2019-04-09 DIAGNOSIS — H02401 Unspecified ptosis of right eyelid: Secondary | ICD-10-CM | POA: Diagnosis present

## 2019-04-09 DIAGNOSIS — Z923 Personal history of irradiation: Secondary | ICD-10-CM

## 2019-04-09 DIAGNOSIS — Y842 Radiological procedure and radiotherapy as the cause of abnormal reaction of the patient, or of later complication, without mention of misadventure at the time of the procedure: Secondary | ICD-10-CM | POA: Diagnosis present

## 2019-04-09 DIAGNOSIS — C7A8 Other malignant neuroendocrine tumors: Secondary | ICD-10-CM | POA: Diagnosis not present

## 2019-04-09 DIAGNOSIS — G902 Horner's syndrome: Secondary | ICD-10-CM | POA: Diagnosis present

## 2019-04-09 DIAGNOSIS — J9611 Chronic respiratory failure with hypoxia: Secondary | ICD-10-CM | POA: Diagnosis present

## 2019-04-09 DIAGNOSIS — C7931 Secondary malignant neoplasm of brain: Secondary | ICD-10-CM | POA: Diagnosis present

## 2019-04-09 DIAGNOSIS — G893 Neoplasm related pain (acute) (chronic): Secondary | ICD-10-CM | POA: Diagnosis present

## 2019-04-09 DIAGNOSIS — Z515 Encounter for palliative care: Secondary | ICD-10-CM | POA: Diagnosis present

## 2019-04-09 DIAGNOSIS — Y846 Urinary catheterization as the cause of abnormal reaction of the patient, or of later complication, without mention of misadventure at the time of the procedure: Secondary | ICD-10-CM | POA: Diagnosis present

## 2019-04-09 DIAGNOSIS — R627 Adult failure to thrive: Secondary | ICD-10-CM

## 2019-04-09 DIAGNOSIS — G9520 Unspecified cord compression: Secondary | ICD-10-CM | POA: Diagnosis not present

## 2019-04-09 DIAGNOSIS — I10 Essential (primary) hypertension: Secondary | ICD-10-CM | POA: Diagnosis present

## 2019-04-09 DIAGNOSIS — K219 Gastro-esophageal reflux disease without esophagitis: Secondary | ICD-10-CM | POA: Diagnosis present

## 2019-04-09 DIAGNOSIS — N390 Urinary tract infection, site not specified: Secondary | ICD-10-CM | POA: Diagnosis present

## 2019-04-09 DIAGNOSIS — J90 Pleural effusion, not elsewhere classified: Secondary | ICD-10-CM | POA: Diagnosis present

## 2019-04-09 DIAGNOSIS — Z8261 Family history of arthritis: Secondary | ICD-10-CM

## 2019-04-09 DIAGNOSIS — Z66 Do not resuscitate: Secondary | ICD-10-CM | POA: Diagnosis present

## 2019-04-09 DIAGNOSIS — F329 Major depressive disorder, single episode, unspecified: Secondary | ICD-10-CM | POA: Diagnosis present

## 2019-04-09 DIAGNOSIS — D61818 Other pancytopenia: Secondary | ICD-10-CM | POA: Diagnosis present

## 2019-04-09 DIAGNOSIS — Z978 Presence of other specified devices: Secondary | ICD-10-CM

## 2019-04-09 DIAGNOSIS — G8314 Monoplegia of lower limb affecting left nondominant side: Secondary | ICD-10-CM | POA: Diagnosis present

## 2019-04-09 DIAGNOSIS — R609 Edema, unspecified: Secondary | ICD-10-CM | POA: Diagnosis not present

## 2019-04-09 DIAGNOSIS — R29810 Facial weakness: Secondary | ICD-10-CM | POA: Diagnosis present

## 2019-04-09 DIAGNOSIS — R630 Anorexia: Secondary | ICD-10-CM | POA: Diagnosis present

## 2019-04-09 DIAGNOSIS — E669 Obesity, unspecified: Secondary | ICD-10-CM | POA: Diagnosis present

## 2019-04-09 DIAGNOSIS — Z885 Allergy status to narcotic agent status: Secondary | ICD-10-CM

## 2019-04-09 DIAGNOSIS — R262 Difficulty in walking, not elsewhere classified: Secondary | ICD-10-CM | POA: Diagnosis present

## 2019-04-09 DIAGNOSIS — Z9181 History of falling: Secondary | ICD-10-CM

## 2019-04-09 DIAGNOSIS — F419 Anxiety disorder, unspecified: Secondary | ICD-10-CM | POA: Diagnosis present

## 2019-04-09 DIAGNOSIS — Z87891 Personal history of nicotine dependence: Secondary | ICD-10-CM

## 2019-04-09 DIAGNOSIS — Z7952 Long term (current) use of systemic steroids: Secondary | ICD-10-CM

## 2019-04-09 DIAGNOSIS — Z960 Presence of urogenital implants: Secondary | ICD-10-CM | POA: Diagnosis not present

## 2019-04-09 DIAGNOSIS — K649 Unspecified hemorrhoids: Secondary | ICD-10-CM | POA: Diagnosis present

## 2019-04-09 DIAGNOSIS — R52 Pain, unspecified: Secondary | ICD-10-CM | POA: Diagnosis not present

## 2019-04-09 DIAGNOSIS — C720 Malignant neoplasm of spinal cord: Secondary | ICD-10-CM | POA: Diagnosis not present

## 2019-04-09 DIAGNOSIS — R0689 Other abnormalities of breathing: Secondary | ICD-10-CM | POA: Diagnosis not present

## 2019-04-09 DIAGNOSIS — R1084 Generalized abdominal pain: Secondary | ICD-10-CM | POA: Diagnosis not present

## 2019-04-09 DIAGNOSIS — R222 Localized swelling, mass and lump, trunk: Secondary | ICD-10-CM | POA: Diagnosis not present

## 2019-04-09 DIAGNOSIS — Z20828 Contact with and (suspected) exposure to other viral communicable diseases: Secondary | ICD-10-CM | POA: Diagnosis not present

## 2019-04-09 DIAGNOSIS — S0993XA Unspecified injury of face, initial encounter: Secondary | ICD-10-CM | POA: Diagnosis not present

## 2019-04-09 DIAGNOSIS — S0990XA Unspecified injury of head, initial encounter: Secondary | ICD-10-CM | POA: Diagnosis not present

## 2019-04-09 DIAGNOSIS — R000 Tachycardia, unspecified: Secondary | ICD-10-CM | POA: Diagnosis not present

## 2019-04-09 LAB — CBC WITH DIFFERENTIAL/PLATELET
Abs Immature Granulocytes: 0.1 10*3/uL — ABNORMAL HIGH (ref 0.00–0.07)
Basophils Absolute: 0 10*3/uL (ref 0.0–0.1)
Basophils Relative: 1 %
Eosinophils Absolute: 0 10*3/uL (ref 0.0–0.5)
Eosinophils Relative: 0 %
HCT: 31.9 % — ABNORMAL LOW (ref 36.0–46.0)
Hemoglobin: 9.8 g/dL — ABNORMAL LOW (ref 12.0–15.0)
Immature Granulocytes: 8 %
Lymphocytes Relative: 11 %
Lymphs Abs: 0.1 10*3/uL — ABNORMAL LOW (ref 0.7–4.0)
MCH: 25.5 pg — ABNORMAL LOW (ref 26.0–34.0)
MCHC: 30.7 g/dL (ref 30.0–36.0)
MCV: 83.1 fL (ref 80.0–100.0)
Monocytes Absolute: 0.1 10*3/uL (ref 0.1–1.0)
Monocytes Relative: 9 %
Neutro Abs: 0.9 10*3/uL — ABNORMAL LOW (ref 1.7–7.7)
Neutrophils Relative %: 71 %
Platelets: 57 10*3/uL — ABNORMAL LOW (ref 150–400)
RBC: 3.84 MIL/uL — ABNORMAL LOW (ref 3.87–5.11)
RDW: 14.6 % (ref 11.5–15.5)
WBC: 1.3 10*3/uL — CL (ref 4.0–10.5)
nRBC: 4 % — ABNORMAL HIGH (ref 0.0–0.2)

## 2019-04-09 LAB — COMPREHENSIVE METABOLIC PANEL
ALT: 73 U/L — ABNORMAL HIGH (ref 0–44)
AST: 67 U/L — ABNORMAL HIGH (ref 15–41)
Albumin: 2.9 g/dL — ABNORMAL LOW (ref 3.5–5.0)
Alkaline Phosphatase: 232 U/L — ABNORMAL HIGH (ref 38–126)
Anion gap: 14 (ref 5–15)
BUN: 21 mg/dL — ABNORMAL HIGH (ref 6–20)
CO2: 24 mmol/L (ref 22–32)
Calcium: 9.4 mg/dL (ref 8.9–10.3)
Chloride: 99 mmol/L (ref 98–111)
Creatinine, Ser: 0.49 mg/dL (ref 0.44–1.00)
GFR calc Af Amer: >60 mL/min (ref >60)
GFR calc non Af Amer: >60 mL/min (ref >60)
Glucose, Bld: 117 mg/dL — ABNORMAL HIGH (ref 70–99)
Potassium: 4 mmol/L (ref 3.5–5.1)
Sodium: 137 mmol/L (ref 135–145)
Total Bilirubin: 1.3 mg/dL — ABNORMAL HIGH (ref 0.3–1.2)
Total Protein: 6.7 g/dL (ref 6.5–8.1)

## 2019-04-09 LAB — URINALYSIS, ROUTINE W REFLEX MICROSCOPIC
Bilirubin Urine: NEGATIVE
Glucose, UA: NEGATIVE mg/dL
Ketones, ur: 5 mg/dL — AB
Nitrite: NEGATIVE
Protein, ur: NEGATIVE mg/dL
Specific Gravity, Urine: 1.017 (ref 1.005–1.030)
pH: 6 (ref 5.0–8.0)

## 2019-04-09 LAB — CBG MONITORING, ED: Glucose-Capillary: 103 mg/dL — ABNORMAL HIGH (ref 70–99)

## 2019-04-09 MED ORDER — FENTANYL CITRATE (PF) 100 MCG/2ML IJ SOLN
50.0000 ug | Freq: Once | INTRAMUSCULAR | Status: AC
  Administered 2019-04-09: 50 ug via INTRAVENOUS
  Filled 2019-04-09: qty 2

## 2019-04-09 MED ORDER — CEPHALEXIN 500 MG PO CAPS
500.0000 mg | ORAL_CAPSULE | Freq: Once | ORAL | Status: AC
  Administered 2019-04-09: 500 mg via ORAL
  Filled 2019-04-09: qty 1

## 2019-04-09 MED ORDER — ONDANSETRON HCL 4 MG PO TABS
4.0000 mg | ORAL_TABLET | Freq: Once | ORAL | Status: AC
  Administered 2019-04-09: 4 mg via ORAL
  Filled 2019-04-09: qty 1

## 2019-04-09 MED ORDER — SODIUM CHLORIDE 0.9 % IV BOLUS (SEPSIS)
500.0000 mL | Freq: Once | INTRAVENOUS | Status: AC
  Administered 2019-04-10: 500 mL via INTRAVENOUS

## 2019-04-09 MED ORDER — IOHEXOL 350 MG/ML SOLN
100.0000 mL | Freq: Once | INTRAVENOUS | Status: AC | PRN
  Administered 2019-04-10: 100 mL via INTRAVENOUS

## 2019-04-09 MED ORDER — SODIUM CHLORIDE 0.9 % IV SOLN
1000.0000 mL | INTRAVENOUS | Status: DC
  Administered 2019-04-12 (×2): 1000 mL via INTRAVENOUS

## 2019-04-09 MED ORDER — DEXAMETHASONE SODIUM PHOSPHATE 10 MG/ML IJ SOLN
10.0000 mg | Freq: Once | INTRAMUSCULAR | Status: AC
  Administered 2019-04-09: 10 mg via INTRAVENOUS
  Filled 2019-04-09: qty 1

## 2019-04-09 NOTE — ED Provider Notes (Signed)
[facility] EMERGENCY DEPARTMENT Provider Note   CSN: [id number] Arrival date & time: 04/09/19  1526     History   Chief Complaint Chief Complaint  Patient presents with  . Numbness    HPI Rasa Nicholl Onstott is a 58 y.o. female.     Patient is a 58 year old female who presents to the emergency department by EMS after a fall.  Patient has a history of increasing falls over the last few weeks.  She has a history of tumor at the T9 area involving the vertebral body and having a mass-effect on the cord.  There is also noted tumor at the L1-L2 and the T12-L1 area.  There is a mass of the left chest wall.  The patient states that over the last few weeks she has been having increasing problems with standing and walking.  She is having increasing pain.  She has not had a bowel movement for 6 days.  She is losing some sensation in both lower extremities.  She is noticing some swelling of her feet, she is having some nausea, without actual vomiting.  Patient states that her family noticed some increase in facial drooping on the right.  And the patient states she has a sensation of numbness from the right side of her nose down to the chin.  The patient contacted her primary physician and was advised to go to the [facility] for additional evaluation as it was a question is whether or not she could possibly be having a stroke.  The patient states that EMS told her they could bring her to this facility and then she could be transferred if needed.  The patient states the numbness of the right face and the pain of the abdomen seem to be getting somewhat worse.  She is requesting assistance with these multiple issues.  The history is provided by the patient.    Past Medical History:  Diagnosis Date  . Arthritis     Patient Active Problem List   Diagnosis Date Noted  . Follow-up examination following surgery 04/03/2019  . 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 Metastatic cancer to spine  ([place]) 03/27/2019  . Falls 03/27/2019  . Axillary lymphadenopathy   . Lytic lesion of bone on x-ray 03/12/2019    Past Surgical History:  Procedure Laterality Date  . AXILLARY LYMPH NODE BIOPSY Left 03/25/2019   Procedure: LEFT AXILLARY LYMPH NODE BIOPSY;  Surgeon: Aviva Signs, MD;  Location: [facility];  Service: General;  Laterality: Left;  . BACK SURGERY    . BREAST SURGERY       OB History   No obstetric history on file.      Home Medications    Prior to Admission medications   Medication Sig Start Date End Date Taking? Authorizing Provider  ALPRAZolam Duanne Moron) 0.5 MG tablet Take 0.5 mg by mouth 2 (two) times daily as needed for anxiety.  02/16/19   [provider]  amLODipine (NORVASC) 5 MG tablet Take 1 tablet (5 mg total) by mouth daily. 03/29/19 03/28/20  [place] Eric J, DO  dexamethasone (DECADRON) 4 MG tablet Take 1 tablet (4 mg total) by mouth every 6 (six) hours for 20 days. 03/29/19 04/18/19  [place] Donnamarie Poag, DO  furosemide (LASIX) 20 MG tablet Take 1 tablet (20 mg total) by mouth daily. 04/05/19   Tyler Pita, MD  ibuprofen (ADVIL) 800 MG tablet Take 800 mg by mouth 4 (four) times daily.  01/10/19   [provider]  Iron-Vitamins (GERITOL PO) Take 1 tablet by mouth daily.    [provider]  ondansetron (ZOFRAN) 4 MG tablet Take 1 tablet (4 mg total) by mouth every 6 (six) hours as needed for nausea. 03/29/19   [place] Eric J, DO  pantoprazole (PROTONIX) 40 MG tablet Take 1 tablet (40 mg total) by mouth daily. 03/30/19   [place] Donnamarie Poag, DO  polyethylene glycol (MIRALAX / GLYCOLAX) 17 g packet Take 17 g by mouth daily as needed for mild constipation. 03/29/19   [place] Donnamarie Poag, DO  traMADol (ULTRAM) 50 MG tablet Take 1 tablet (50 mg total) by mouth every 6 (six) hours as needed. 03/25/19   Aviva Signs, MD  venlafaxine XR (EFFEXOR-XR) 75 MG 24 hr capsule Take 75 mg by mouth at bedtime.  02/14/19   [provider]  vitamin C (ASCORBIC ACID) 500  MG tablet Take 500 mg by mouth daily.    [provider]    Family History Family History  Problem Relation Age of Onset  . Arthritis Mother   . Heart disease Father   . Heart disease Sister   . Arthritis Brother   . Heart disease Sister     Social History Social History   Tobacco Use  . Smoking status: Former Smoker    Packs/day: 1.00    Years: 15.00    Pack years: 15.00    Types: Cigarettes    Quit date: 03/11/2009    Years since quitting: 10.0  . Smokeless tobacco: Never Used  Substance Use Topics  . Alcohol use: Not Currently  . Drug use: Never     Allergies   Hydrocodone   Review of Systems Review of Systems  Constitutional: Negative for activity change.       All ROS Neg except as noted in HPI  HENT: Negative for nosebleeds and trouble swallowing.   Eyes: Negative for photophobia and discharge.  Respiratory: Negative for apnea, cough, choking, shortness of breath, wheezing and stridor.   Cardiovascular: Positive for leg swelling. Negative for chest pain and palpitations.  Gastrointestinal: Positive for constipation and nausea. Negative for abdominal pain and blood in stool.  Genitourinary: Negative for dysuria, frequency, hematuria, vaginal bleeding and vaginal discharge.  Musculoskeletal: Positive for arthralgias and gait problem. Negative for back pain and neck pain.  Skin: Negative.   Neurological: Positive for facial asymmetry, weakness and numbness. Negative for dizziness, seizures, syncope and speech difficulty.  Psychiatric/Behavioral: Negative for confusion and hallucinations.     Physical Exam Updated Vital Signs BP 99/62   Pulse (!) 122   Temp 98.7 F (37.1 C) (Oral)   Resp (!) 21   Ht 5\' 6"  (1.676 m)   Wt 100.7 kg   LMP 05/22/2011   SpO2 98%   BMI 35.83 kg/m   Physical Exam Vitals signs and nursing note reviewed.  Constitutional:      Appearance: She is well-developed. She is not toxic-appearing.  HENT:     Head:  Normocephalic.     Comments: There is a slight loss of the right nasolabial fold.  There appears to be mild increase in fullness of the right cheek compared to the left.  The area is not hot, and is not tender.    Right Ear: Tympanic membrane and external ear normal.     Left Ear: Tympanic membrane and external ear normal.  Eyes:     General: Lids are normal.     Pupils: Pupils are equal, round, and reactive to light.  Comments: There is right proptosis present.  This is not a new finding for this patient.  No extraocular movement function on the right.  Neck:     Musculoskeletal: Normal range of motion and neck supple.     Vascular: No carotid bruit.  Cardiovascular:     Rate and Rhythm: Regular rhythm. Tachycardia present.     Pulses: Normal pulses.     Heart sounds: Normal heart sounds.  Pulmonary:     Effort: No respiratory distress.     Breath sounds: Normal breath sounds.     Comments: Decreased breath sounds at the bases.  The patient speaks in complete sentences without problem. Abdominal:     General: Bowel sounds are normal.     Palpations: Abdomen is soft.     Tenderness: There is no abdominal tenderness. There is no guarding.  Musculoskeletal: Normal range of motion.     Thoracic back: She exhibits pain.     Lumbar back: She exhibits pain.  Lymphadenopathy:     Head:     Right side of head: No submandibular adenopathy.     Left side of head: No submandibular adenopathy.     Cervical: No cervical adenopathy.  Skin:    General: Skin is warm and dry.  Neurological:     Mental Status: She is alert and oriented to person, place, and time.     Cranial Nerves: No cranial nerve deficit.     Sensory: No sensory deficit.     Comments: There is proptosis of the right eye, and I cannot demonstrate extraocular movements involving the right eye.  The patient has difficulty and some weakness of the right upper extremity.  Psychiatric:        Speech: Speech normal.       ED Treatments / Results  Labs (all labs ordered are listed, but only abnormal results are displayed) Labs Reviewed  COMPREHENSIVE METABOLIC PANEL - Abnormal; Notable for the following components:      Result Value   Glucose, Bld 117 (*)    BUN 21 (*)    Albumin 2.9 (*)    AST 67 (*)    ALT 73 (*)    Alkaline Phosphatase 232 (*)    Total Bilirubin 1.3 (*)    All other components within normal limits  CBG MONITORING, ED - Abnormal; Notable for the following components:   Glucose-Capillary 103 (*)    All other components within normal limits  CBC WITH DIFFERENTIAL/PLATELET  URINALYSIS, ROUTINE W REFLEX MICROSCOPIC    EKG    Radiology Dg Chest Portable 1 View  Result Date: 04/09/2019 CLINICAL DATA:  Loss of sensation to bilateral lower extremities. Reported history of metastatic carcinoma and mass left posterior chest wall by recent MRI of the thoracic spine. EXAM: PORTABLE CHEST 1 VIEW COMPARISON:  MRI of the thoracic spine on 03/27/2019 FINDINGS: Lobulated soft tissue mass projects over the lateral left chest and is likely causing bony destruction a segment of the left sixth rib. Aeration at the left lung base has improved since the prior chest x-ray. No significant pleural fluid identified. No evidence of pneumothorax. IMPRESSION: Large soft tissue mass of the left chest causing visible bony destruction of the left sixth rib. Electronically Signed   By: Aletta Edouard M.D.   On: 04/09/2019 17:09    Procedures Procedures (including critical care time)  Medications Ordered in ED Medications  fentaNYL (SUBLIMAZE) injection 50 mcg (50 mcg Intravenous Given 04/09/19 1655)  ondansetron (ZOFRAN) tablet  4 mg (4 mg Oral Given 04/09/19 1656)     Initial Impression / Assessment and Plan / ED Course  I have reviewed the triage vital signs and the nursing notes.  Pertinent labs & imaging results that were available during my care of the patient were reviewed by me and considered in my  medical decision making (see chart for details).          Final Clinical Impressions(s) / ED Diagnoses MDM    I was informed of a critical white blood cell count of 1.3.  This is lower than the previous study.  The patient also has a low red blood cell count of 3.84 and a low platelet of 57 for a pancytopenia.  Case discussed with Dr. Roderic Palau.  Will discuss case with Dr. Raliegh Ip upon receiving results of the CT head scan.   CT head scan and CT maxillofacial scans are negative for acute problems.  In particular there is no evidence of cerebrovascular accident.  Case discussed with Dr. Raliegh Ip.  Patient to be admitted.  He will consult on the patient.  Case discussed with Dr. Maudie Mercury with [facility].  COVID tests ordered.  Patient to be admitted at this facility.     Final diagnoses:  Weakness  Pancytopenia ([place])  Metastatic cancer [facility])    ED Discharge Orders    None       Lily Kocher, PA-C 04/10/19 Kathreen Cornfield, MD 04/16/19 1248

## 2019-04-09 NOTE — Telephone Encounter (Signed)
Sam, It sounds like Ms. Welz needs to come in through the ED for further evaluation.  I am not sure what to make of the right facial numbness, so likely needs to r/o stroke.  Please let her daughter-in-law know and encourage them to present promptly for evaluation at [facility]. Ailene Ards

## 2019-04-09 NOTE — ED Notes (Signed)
Patient transported to CT 

## 2019-04-09 NOTE — ED Notes (Signed)
Date and time results received: 04/09/19 1854 (use smartphrase ".now" to insert current time)  Test: WBC  Critical Value:1.3 Name of Provider Notified: Lily Kocher PA  Orders Received? Or Actions Taken?: NA

## 2019-04-09 NOTE — Telephone Encounter (Signed)
-----   Message from Janan Ridge sent at 04/08/2019  4:56 PM EDT ----- Regarding: Needing Assistance Hey Sam,  Brandi Rodriguez daughter in law would like to speak with you to get some assistance for her mother in law. She states she is now unable to move her legs and feet. She states she needs a hospital bed and a aid who can bath her on regular basis. She also states that a prescription was left for the patient for a slide board. Please call the daughter in law at [phone] around 1:15 she states she will be on her lunch break then and be able to talk to you more about what she needs. Thank you.

## 2019-04-09 NOTE — Telephone Encounter (Signed)
Phoned daughter in law, Crystal, as requested. Explained I could not disclose anything reference her mother in laws diagnosis since she isn't listed on her release. Crystal verbalized understanding. Crystal verbalized she requested the call back because she is very concerned about how her mother in laws condition is rapidly deteriorating. Crystal explains that her mother in law complains of being unable to feel from her waist down or the right side of her face. She explains her mother in law is unable to lift her right arm or put any weight on her legs. Crystal reports her mother in law is sleeping on the couch because she is unable to get comfortable anywhere else. She reports she has fallen and rolled off the cough several times. Crystal endorses that the patient could stand on her on and drag her feet at discharge but is unable to even do that now. She reports the patient's legs are cold to the touch and very swollen. She reports the patient refuses to wear shoes and socks because they are painful.  Crystal reports that her and her husband (patient's son) built a ramp on Saturday to get her in and out of the house. She reports that the patient has a wheelchair and a potty chair. She request a hospital bed and assistance with hygiene.

## 2019-04-09 NOTE — Telephone Encounter (Signed)
Informed Merrilee Seashore, RT on L1 that patient will not be coming for radiation therapy today but instead going to emergency room for evaluation. Phoned Jinny Blossom, [place] charge at [facility] ED. She encouraged the patient go to [facility] if possible in the event of stroke but reassured me if the patient checked in there she would be taken care of. Phoned patient back encouraged she present to [facility] but [place] at the very least. She verbalized understanding. Attempted again to call Janett Billow (granddaughter) and Teacher, music (daughter in Sports coach) without success. Left voicemail messages for both with my direct number.

## 2019-04-09 NOTE — Telephone Encounter (Signed)
Phoned patient's home to inquire about status. Patient endorses all things relayed by Crystal. Instructed patient to [facility] and forego radiation therapy today. She verbalized understanding. Attempted twice to reach Jodie Echevaria, Financial controller but was unsuccessful. Will contact ED charge nurse with a heads up the patient is in route.

## 2019-04-09 NOTE — ED Notes (Signed)
ED Provider at bedside. 

## 2019-04-09 NOTE — ED Triage Notes (Signed)
Pt fell transferring from chair to wheelchair onto floor, pt denies injury from fall but wants evaluation for loss of sensation to bilateral lower extremities x 5 days, pt states she has had 8 radiation treatments for recent diagnosis of tumor to spine and lung cancer, pt reports no BM in 6 days, bilateral swelling to both feet in the past week

## 2019-04-10 ENCOUNTER — Observation Stay (HOSPITAL_COMMUNITY): Payer: BC Managed Care – PPO

## 2019-04-10 ENCOUNTER — Encounter (HOSPITAL_COMMUNITY): Payer: Self-pay | Admitting: Internal Medicine

## 2019-04-10 ENCOUNTER — Ambulatory Visit: Payer: BC Managed Care – PPO

## 2019-04-10 DIAGNOSIS — C7A1 Malignant poorly differentiated neuroendocrine tumors: Secondary | ICD-10-CM

## 2019-04-10 DIAGNOSIS — D61818 Other pancytopenia: Secondary | ICD-10-CM | POA: Diagnosis present

## 2019-04-10 DIAGNOSIS — N390 Urinary tract infection, site not specified: Secondary | ICD-10-CM

## 2019-04-10 DIAGNOSIS — C7931 Secondary malignant neoplasm of brain: Secondary | ICD-10-CM | POA: Diagnosis not present

## 2019-04-10 DIAGNOSIS — M8448XA Pathological fracture, other site, initial encounter for fracture: Secondary | ICD-10-CM | POA: Diagnosis not present

## 2019-04-10 DIAGNOSIS — R4182 Altered mental status, unspecified: Secondary | ICD-10-CM | POA: Diagnosis not present

## 2019-04-10 DIAGNOSIS — M8458XA Pathological fracture in neoplastic disease, other specified site, initial encounter for fracture: Secondary | ICD-10-CM | POA: Diagnosis not present

## 2019-04-10 DIAGNOSIS — R220 Localized swelling, mass and lump, head: Secondary | ICD-10-CM | POA: Diagnosis not present

## 2019-04-10 LAB — COMPREHENSIVE METABOLIC PANEL
ALT: 67 U/L — ABNORMAL HIGH (ref 0–44)
AST: 62 U/L — ABNORMAL HIGH (ref 15–41)
Albumin: 2.5 g/dL — ABNORMAL LOW (ref 3.5–5.0)
Alkaline Phosphatase: 236 U/L — ABNORMAL HIGH (ref 38–126)
Anion gap: 12 (ref 5–15)
BUN: 18 mg/dL (ref 6–20)
CO2: 24 mmol/L (ref 22–32)
Calcium: 9.1 mg/dL (ref 8.9–10.3)
Chloride: 104 mmol/L (ref 98–111)
Creatinine, Ser: 0.42 mg/dL — ABNORMAL LOW (ref 0.44–1.00)
GFR calc Af Amer: >60 mL/min (ref >60)
GFR calc non Af Amer: >60 mL/min (ref >60)
Glucose, Bld: 129 mg/dL — ABNORMAL HIGH (ref 70–99)
Potassium: 3.9 mmol/L (ref 3.5–5.1)
Sodium: 140 mmol/L (ref 135–145)
Total Bilirubin: 0.7 mg/dL (ref 0.3–1.2)
Total Protein: 6.1 g/dL — ABNORMAL LOW (ref 6.5–8.1)

## 2019-04-10 LAB — CBC
HCT: 28.6 % — ABNORMAL LOW (ref 36.0–46.0)
Hemoglobin: 8.8 g/dL — ABNORMAL LOW (ref 12.0–15.0)
MCH: 25.4 pg — ABNORMAL LOW (ref 26.0–34.0)
MCHC: 30.8 g/dL (ref 30.0–36.0)
MCV: 82.4 fL (ref 80.0–100.0)
Platelets: 43 10*3/uL — ABNORMAL LOW (ref 150–400)
RBC: 3.47 MIL/uL — ABNORMAL LOW (ref 3.87–5.11)
RDW: 14.8 % (ref 11.5–15.5)
WBC: 1.3 10*3/uL — CL (ref 4.0–10.5)
nRBC: 3.9 % — ABNORMAL HIGH (ref 0.0–0.2)

## 2019-04-10 LAB — PROTIME-INR
INR: 1.1 (ref 0.8–1.2)
Prothrombin Time: 14.3 s (ref 11.4–15.2)

## 2019-04-10 LAB — APTT: aPTT: 31 s (ref 24–36)

## 2019-04-10 LAB — D-DIMER, QUANTITATIVE: D-Dimer, Quant: 8.42 ug{FEU}/mL — ABNORMAL HIGH (ref 0.00–0.50)

## 2019-04-10 MED ORDER — ALPRAZOLAM 0.5 MG PO TABS
0.5000 mg | ORAL_TABLET | Freq: Two times a day (BID) | ORAL | Status: DC | PRN
  Administered 2019-04-10 – 2019-04-26 (×15): 0.5 mg via ORAL
  Filled 2019-04-10 (×15): qty 1

## 2019-04-10 MED ORDER — TRAMADOL HCL 50 MG PO TABS
50.0000 mg | ORAL_TABLET | Freq: Four times a day (QID) | ORAL | Status: DC | PRN
  Administered 2019-04-11 – 2019-04-16 (×15): 50 mg via ORAL
  Filled 2019-04-10 (×15): qty 1

## 2019-04-10 MED ORDER — FUROSEMIDE 20 MG PO TABS
20.0000 mg | ORAL_TABLET | Freq: Every day | ORAL | Status: DC
  Administered 2019-04-10 – 2019-04-26 (×17): 20 mg via ORAL
  Filled 2019-04-10 (×17): qty 1

## 2019-04-10 MED ORDER — GADOBUTROL 1 MMOL/ML IV SOLN
7.5000 mL | Freq: Once | INTRAVENOUS | Status: AC | PRN
  Administered 2019-04-10: 7.5 mL via INTRAVENOUS

## 2019-04-10 MED ORDER — VENLAFAXINE HCL ER 75 MG PO CP24
75.0000 mg | ORAL_CAPSULE | Freq: Every day | ORAL | Status: DC
  Administered 2019-04-10 – 2019-04-13 (×4): 75 mg via ORAL
  Filled 2019-04-10 (×4): qty 1

## 2019-04-10 MED ORDER — ONDANSETRON HCL 4 MG PO TABS
4.0000 mg | ORAL_TABLET | Freq: Four times a day (QID) | ORAL | Status: DC | PRN
  Administered 2019-04-17: 4 mg via ORAL
  Filled 2019-04-10: qty 1

## 2019-04-10 MED ORDER — AMLODIPINE BESYLATE 5 MG PO TABS
5.0000 mg | ORAL_TABLET | Freq: Every day | ORAL | Status: DC
  Administered 2019-04-10 – 2019-04-13 (×4): 5 mg via ORAL
  Filled 2019-04-10 (×4): qty 1

## 2019-04-10 MED ORDER — DEXAMETHASONE 4 MG PO TABS
4.0000 mg | ORAL_TABLET | Freq: Four times a day (QID) | ORAL | Status: DC
  Administered 2019-04-10 – 2019-04-26 (×65): 4 mg via ORAL
  Filled 2019-04-10 (×66): qty 1

## 2019-04-10 MED ORDER — PANTOPRAZOLE SODIUM 40 MG PO TBEC
40.0000 mg | DELAYED_RELEASE_TABLET | Freq: Every day | ORAL | Status: DC
  Administered 2019-04-10 – 2019-04-26 (×17): 40 mg via ORAL
  Filled 2019-04-10 (×17): qty 1

## 2019-04-10 MED ORDER — DEXAMETHASONE SODIUM PHOSPHATE 4 MG/ML IJ SOLN
4.0000 mg | Freq: Four times a day (QID) | INTRAMUSCULAR | Status: DC
  Administered 2019-04-10: 4 mg via INTRAVENOUS
  Filled 2019-04-10: qty 1

## 2019-04-10 MED ORDER — PRO-STAT SUGAR FREE PO LIQD
30.0000 mL | Freq: Two times a day (BID) | ORAL | Status: DC
  Administered 2019-04-10 – 2019-04-26 (×29): 30 mL via ORAL
  Filled 2019-04-10 (×32): qty 30

## 2019-04-10 MED ORDER — POLYETHYLENE GLYCOL 3350 17 G PO PACK
17.0000 g | PACK | Freq: Every day | ORAL | Status: DC | PRN
  Administered 2019-04-10: 17 g via ORAL
  Filled 2019-04-10: qty 1

## 2019-04-10 MED ORDER — SODIUM CHLORIDE 0.9 % IV SOLN
1.0000 g | INTRAVENOUS | Status: DC
  Administered 2019-04-10 – 2019-04-12 (×3): 1 g via INTRAVENOUS
  Filled 2019-04-10: qty 10
  Filled 2019-04-10: qty 1
  Filled 2019-04-10: qty 10

## 2019-04-10 NOTE — H&P (Signed)
TRH H&P    Patient Demographics:    Brandi Rodriguez, is a 58 y.o. female  MRN: [id number]  DOB - [birth year]  Admit Date - 04/09/2019  Referring MD/NP/PA: Meryl Crutch  Outpatient Primary MD for the patient is Sasser, Silvestre Moment, MD [facility] - oncology  Patient coming from:  home  Chief complaint- weakness, unable to walk, metastatic cancer to spine, UTI   HPI:    Brandi Rodriguez  is a 59 y.o. female,  58 y.o.femalewith medical history significant formetastatic cancer [facility] with reports of falls and lower extremity weakness. She reportedlower extremity weakness left worse than right that started one day PTAwith pain in her back also. One week prior she noticed that her right eyelid drooped.   She  currently undergoing work-upforher metastatic cancer,after MRI lumbar spine5/15/20showed innumerable bony lesions in the low presacral region,with epidural tumor at L5,sided epidural and paravertebral tumor at L1 involving T12-L1. 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 Delton Coombes , stat MRI of the thoracic spine 03/27/2019 showed diffuse osseous metastatic disease secondary to large left chest wall mass,epidural tumor deposit centered at T9 and L1 subsequently transferred [place], with recommendation for transfer to [facility] with neurosurgery and radiationoncology consultation.  Patient was evaluated by neurosurgery who recommended no acute neurosurgical intervention at this time with further recommendations of radiation therapy.  Patient started and completed 8/10 radiation treatments.  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  Likely secondary to  metastatic cancer and large left chest mass.  CT head negative for acute abnormality.  Surgery believes this is possibly concerning for cavernous sinus localization due to complete loss of extraocular motor function.  Recommend outpatient MR brain with and without contrast if indicated.  Right foot edema Vascular duplex venous ultrasound right lower extremity negative for DVT.  Dyspnea Hypoxia Initially requiring up to 3 L nasal cannula due to shortness of breath and desaturation.  Etiology likely from her large chest mass.  Her oxygen was removed with oxygen saturation at 89%, unfortunately does not meet criteria for home oxygen at this time.  Weakness/gait disturbance Patient with significant disability secondary to her widely metastatic disease with several spinal fractures and spinal cord impingement without central cord compression.  Was evaluated by physical therapy with recommendations of home health with wheelchair and 3 in 1 commode.  Acute urinary retention Was noted to have difficulty urinating with retained urine on bladder scan.  Etiology likely from her metastatic spinal disease process with slight cord and nerve root compression.  Foley catheter was placed.  Patient will maintain this Foley catheter on discharge.  Pt represents to ED 04/10/2019 due to inability to walk secondary to generalized weakness. ED spoke to Dr. Delton Coombes who requested medicine admission to [facility]  Per ED  In ED,   T 98.7  P 116  R 29  Bp  116/36  Pox 98% on 2L [place] Wt 100.7kg  Wbc 1.3, Hgb 9.8, Plt 57 (per ED, Dr. Worthy Keeler thinks related to XRT)  Bun 21, Creatinine 0.49 Na 137, K 4.0 Ast 67, Alt 73, Alk phos 232, T. Bili 0.3   Urinalysis wbc 11-20 rbc 0-5  Pt tx with zofran 45m iv x1, Fentanyl iv x1, dexamethasone 169miv x1, and keflex 50091mo x1   Pt will be admitted for weakness generalized but especially in her legs, and inability to walk, and pancytopenia.        Review of systems:      In addition to the HPI above,  No Fever-chills, No Headache, No changes with Vision or hearing, No problems swallowing food or Liquids, No Chest pain, Cough or Shortness of Breath, No Abdominal pain, No Nausea or Vomiting, bowel movements are regular, No Blood in stool or Urine, No dysuria, No new skin rashes or bruises, No new joints pains-aches,   No recent weight gain or loss, No polyuria, polydypsia or polyphagia, No significant Mental Stressors.  All other systems reviewed and are negative.    Past History of the following :    Past Medical History:  Diagnosis Date   Arthritis       Past Surgical History:  Procedure Laterality Date   AXILLARY LYMPH NODE BIOPSY Left 03/25/2019   Procedure: LEFT AXILLARY LYMPH NODE BIOPSY;  Surgeon: JenAviva SignsD;  Location: [facility];  Service: General;  Laterality: Left;   BACK SURGERY     BREAST SURGERY        Social History:      Social History   Tobacco Use   Smoking status: Former Smoker    Packs/day: 1.00    Years: 15.00    Pack years: 15.00    Types: Cigarettes    Quit date: 03/11/2009    Years since quitting: 10.0   Smokeless tobacco: Never Used  Substance Use Topics   Alcohol use: Not Currently       Family History :     Family History  Problem Relation Age of Onset   Arthritis Mother    Heart disease Father    Heart disease Sister    Arthritis Brother    Heart disease Sister        Home Medications:   Prior to Admission medications   Medication Sig Start Date End Date Taking? Authorizing Provider  ALPRAZolam (XADuanne Moron.5 MG tablet Take 0.5 mg by mouth 2 (two) times daily as needed for anxiety.  02/16/19   [provider]  amLODipine (NORVASC) 5 MG tablet Take 1 tablet (5 mg total) by mouth daily. 03/29/19 03/28/20  [place] J, DO  dexamethasone (DECADRON) 4 MG tablet Take 1 tablet (4 mg total) by mouth every 6 (six) hours for 20 days. 03/29/19 04/18/19  [place]riDonnamarie PoagO   furosemide (LASIX) 20 MG tablet Take 1 tablet (20 mg total) by mouth daily. 04/05/19   ManTyler PitaD  ibuprofen (ADVIL) 800 MG tablet Take 800 mg by mouth 4 (four) times daily.  01/10/19   [provider]  Iron-Vitamins (GERITOL PO) Take 1 tablet by mouth daily.    [provider]  ondansetron (ZOFRAN) 4 MG tablet Take 1 tablet (4 mg total) by mouth every 6 (six) hours as needed for nausea. 03/29/19   [place] J, DO  pantoprazole (PROTONIX) 40 MG tablet Take 1 tablet (40 mg total) by mouth daily. 03/30/19   [place]  J, DO  polyethylene glycol (MIRALAX / GLYCOLAX) 17 g packet Take 17 g by mouth daily as needed for mild constipation. 03/29/19   [place] Donnamarie Poag, DO  traMADol (ULTRAM) 50 MG tablet Take 1 tablet (50 mg total) by mouth every 6 (six) hours as needed. 03/25/19   Aviva Signs, MD  venlafaxine XR (EFFEXOR-XR) 75 MG 24 hr capsule Take 75 mg by mouth at bedtime.  02/14/19   [provider]  vitamin C (ASCORBIC ACID) 500 MG tablet Take 500 mg by mouth daily.    [provider]     Allergies:     Allergies  Allergen Reactions   Hydrocodone Other (See Comments)    Makes heart pound     Physical Exam:   Vitals  Blood pressure (!) 116/36, pulse (!) 116, temperature 98.7 F (37.1 C), temperature source Oral, resp. rate (!) 29, height _0  (1.676 m), weight 100.7 kg, last menstrual period 05/22/2011, SpO2 98 %.  1.  General: axoxo3  2. Psychiatric: euthymic  3. Neurologic: 5-/5 in bilateral distal lower ext  4. HEENMT:  Anicteric, Ptosis right eye.   Neck: no jvd  5. Respiratory : CTAB  6. Cardiovascular : Tachy s1, s2  7. Gastrointestinal:  Abd: soft, nt, nd, +bs  8. Skin:  Ext: no c/c/e,  No rash  9.Musculoskeletal:  Good ROM    Data Review:    CBC Recent Labs  Lab 04/09/19 1700  WBC 1.3*  HGB 9.8*  HCT 31.9*  PLT 57*  MCV 83.1  MCH 25.5*  MCHC 30.7  RDW 14.6  LYMPHSABS 0.1*  MONOABS 0.1  EOSABS  0.0  BASOSABS 0.0   ------------------------------------------------------------------------------------------------------------------  Results for orders placed or performed during the hospital encounter of 04/09/19 (from the past 48 hour(s))  POC CBG, ED     Status: Abnormal   Collection Time: 04/09/19  4:59 PM  Result Value Ref Range   Glucose-Capillary 103 (H) 70 - 99 mg/dL  Comprehensive metabolic panel     Status: Abnormal   Collection Time: 04/09/19  5:00 PM  Result Value Ref Range   Sodium 137 135 - 145 mmol/L   Potassium 4.0 3.5 - 5.1 mmol/L   Chloride 99 98 - 111 mmol/L   CO2 24 22 - 32 mmol/L   Glucose, Bld 117 (H) 70 - 99 mg/dL   BUN 21 (H) 6 - 20 mg/dL   Creatinine, Ser 0.49 0.44 - 1.00 mg/dL   Calcium 9.4 8.9 - 10.3 mg/dL   Total Protein 6.7 6.5 - 8.1 g/dL   Albumin 2.9 (L) 3.5 - 5.0 g/dL   AST 67 (H) 15 - 41 U/L   ALT 73 (H) 0 - 44 U/L   Alkaline Phosphatase 232 (H) 38 - 126 U/L   Total Bilirubin 1.3 (H) 0.3 - 1.2 mg/dL   GFR calc non Af Amer >60 >60 mL/min   GFR calc Af Amer >60 >60 mL/min   Anion gap 14 5 - 15    Comment: Performed at [facility], [address], [place], [place] [address]  CBC with Differential     Status: Abnormal   Collection Time: 04/09/19  5:00 PM  Result Value Ref Range   WBC 1.3 (LL) 4.0 - 10.5 K/uL    Comment: WHITE COUNT CONFIRMED ON SMEAR THIS CRITICAL RESULT HAS VERIFIED AND BEEN CALLED TO [facility] BY CHASE ISLEY ON 06 23 2020 AT 1850, AND HAS BEEN READ BACK.     RBC 3.84 (L)  3.87 - 5.11 MIL/uL   Hemoglobin 9.8 (L) 12.0 - 15.0 g/dL   HCT 31.9 (L) 36.0 - 46.0 %   MCV 83.1 80.0 - 100.0 fL   MCH 25.5 (L) 26.0 - 34.0 pg   MCHC 30.7 30.0 - 36.0 g/dL   RDW 14.6 11.5 - 15.5 %   Platelets 57 (L) 150 - 400 K/uL    Comment: PLATELET COUNT CONFIRMED BY SMEAR PLATELETS APPEAR DECREASED SPECIMEN CHECKED FOR CLOTS Immature Platelet Fraction may be clinically indicated, consider ordering this additional test [id number]    nRBC 4.0  (H) 0.0 - 0.2 %   Neutrophils Relative % 71 %   Neutro Abs 0.9 (L) 1.7 - 7.7 K/uL   Lymphocytes Relative 11 %   Lymphs Abs 0.1 (L) 0.7 - 4.0 K/uL   Monocytes Relative 9 %   Monocytes Absolute 0.1 0.1 - 1.0 K/uL   Eosinophils Relative 0 %   Eosinophils Absolute 0.0 0.0 - 0.5 K/uL   Basophils Relative 1 %   Basophils Absolute 0.0 0.0 - 0.1 K/uL   Immature Granulocytes 8 %   Abs Immature Granulocytes 0.10 (H) 0.00 - 0.07 K/uL    Comment: Performed at [facility], [address] [place], [place] [address]  Urinalysis, Routine w reflex microscopic     Status: Abnormal   Collection Time: 04/09/19  6:15 PM  Result Value Ref Range   Color, Urine YELLOW YELLOW   APPearance HAZY (A) CLEAR   Specific Gravity, Urine 1.017 1.005 - 1.030   pH 6.0 5.0 - 8.0   Glucose, UA NEGATIVE NEGATIVE mg/dL   Hgb urine dipstick SMALL (A) NEGATIVE   Bilirubin Urine NEGATIVE NEGATIVE   Ketones, ur 5 (A) NEGATIVE mg/dL   Protein, ur NEGATIVE NEGATIVE mg/dL   Nitrite NEGATIVE NEGATIVE   Leukocytes,Ua TRACE (A) NEGATIVE   RBC / HPF 0-5 0 - 5 RBC/hpf   WBC, UA 11-20 0 - 5 WBC/hpf   Bacteria, UA FEW (A) NONE SEEN   Squamous Epithelial / LPF 0-5 0 - 5   Mucus PRESENT     Comment: Performed at [facility], [address] [place], [place] [address]  Protime-INR     Status: None   Collection Time: 04/09/19 11:54 PM  Result Value Ref Range   Prothrombin Time 14.3 11.4 - 15.2 seconds   INR 1.1 0.8 - 1.2    Comment: (NOTE) INR goal varies based on device and disease states. Performed at [facility], [address], [place], [place] [address]   APTT     Status: None   Collection Time: 04/09/19 11:54 PM  Result Value Ref Range   aPTT 31 24 - 36 seconds    Comment: Performed at [facility], [address], [place], [place] [address]    Chemistries  Recent Labs  Lab 04/09/19 1700  NA 137  K 4.0  CL 99  CO2 24  GLUCOSE 117*  BUN 21*  CREATININE 0.49  CALCIUM 9.4  AST 67*  ALT 73*  ALKPHOS 232*    BILITOT 1.3*   ------------------------------------------------------------------------------------------------------------------  ------------------------------------------------------------------------------------------------------------------ GFR: Estimated Creatinine Clearance: 90.7 mL/min (by C-G formula based on SCr of 0.49 mg/dL). Liver Function Tests: Recent Labs  Lab 04/09/19 1700  AST 67*  ALT 73*  ALKPHOS 232*  BILITOT 1.3*  PROT 6.7  ALBUMIN 2.9*   No results for input(s): LIPASE, AMYLASE in the last 168 hours. No results for input(s): AMMONIA in the last 168 hours. Coagulation Profile: Recent Labs  Lab 04/09/19 2354  INR 1.1   Cardiac Enzymes: No results for input(s): CKTOTAL, CKMB, CKMBINDEX, TROPONINI in the last 168 hours. BNP (last 3 results) No results for input(s): PROBNP in the last 8760 hours. HbA1C: No results for input(s): HGBA1C in the last 72 hours. CBG: Recent Labs  Lab 04/09/19 1659  GLUCAP 103*   Lipid Profile: No results for input(s): CHOL, HDL, LDLCALC, TRIG, CHOLHDL, LDLDIRECT in the last 72 hours. Thyroid Function Tests: No results for input(s): TSH, T4TOTAL, FREET4, T3FREE, THYROIDAB in the last 72 hours. Anemia Panel: No results for input(s): VITAMINB12, FOLATE, FERRITIN, TIBC, IRON, RETICCTPCT in the last 72 hours.  --------------------------------------------------------------------------------------------------------------- Urine analysis:    Component Value Date/Time   COLORURINE YELLOW 04/09/2019 1815   APPEARANCEUR HAZY (A) 04/09/2019 1815   LABSPEC 1.017 04/09/2019 1815   PHURINE 6.0 04/09/2019 1815   GLUCOSEU NEGATIVE 04/09/2019 1815   HGBUR SMALL (A) 04/09/2019 1815   BILIRUBINUR NEGATIVE 04/09/2019 1815   KETONESUR 5 (A) 04/09/2019 1815   PROTEINUR NEGATIVE 04/09/2019 1815   NITRITE NEGATIVE 04/09/2019 1815   LEUKOCYTESUR TRACE (A) 04/09/2019 1815      Imaging Results:    Ct Head Wo Contrast  Result Date:  04/09/2019 CLINICAL DATA:  Fall EXAM: CT HEAD WITHOUT CONTRAST CT MAXILLOFACIAL WITHOUT CONTRAST TECHNIQUE: Multidetector CT imaging of the head and maxillofacial structures were performed using the standard protocol without intravenous contrast. Multiplanar CT image reconstructions of the maxillofacial structures were also generated. COMPARISON:  None. FINDINGS: CT HEAD FINDINGS Brain: There is no mass, hemorrhage or extra-axial collection. The size and configuration of the ventricles and extra-axial CSF spaces are normal. The brain parenchyma is normal, without evidence of acute or chronic infarction. Vascular: No hyperdense vessel or unexpected vascular calcification. Skull: The visualized skull base, calvarium and extracranial soft tissues are normal. CT MAXILLOFACIAL FINDINGS Osseous: --Complex facial fracture types: No LeFort, zygomaticomaxillary complex or nasoorbitoethmoidal fracture. --Simple fracture types: None. --Mandible, hard palate and teeth: No acute abnormality. Orbits: The globes are intact. Normal appearance of the intra- and extraconal fat. Symmetric extraocular muscles. Sinuses: No fluid levels or advanced mucosal thickening. Soft tissues: Normal visualized extracranial soft tissues. IMPRESSION: 1. No acute intracranial abnormality. 2. No maxillofacial fracture. Electronically Signed   By: Ulyses Jarred M.D.   On: 04/09/2019 20:48   Dg Chest Portable 1 View  Result Date: 04/09/2019 CLINICAL DATA:  Loss of sensation to bilateral lower extremities. Reported history of metastatic carcinoma and mass left posterior chest wall by recent MRI of the thoracic spine. EXAM: PORTABLE CHEST 1 VIEW COMPARISON:  MRI of the thoracic spine on 03/27/2019 FINDINGS: Lobulated soft tissue mass projects over the lateral left chest and is likely causing bony destruction a segment of the left sixth rib. Aeration at the left lung base has improved since the prior chest x-ray. No significant pleural fluid identified.  No evidence of pneumothorax. IMPRESSION: Large soft tissue mass of the left chest causing visible bony destruction of the left sixth rib. Electronically Signed   By: Aletta Edouard M.D.   On: 04/09/2019 17:09   Ct Maxillofacial Wo Contrast  Result Date: 04/09/2019 CLINICAL DATA:  Fall EXAM: CT HEAD WITHOUT CONTRAST CT MAXILLOFACIAL WITHOUT CONTRAST TECHNIQUE: Multidetector CT imaging of the head and maxillofacial structures were performed using the standard protocol without intravenous contrast. Multiplanar CT image reconstructions of the maxillofacial structures were also generated. COMPARISON:  None. FINDINGS: CT HEAD FINDINGS Brain: There is no mass, hemorrhage or extra-axial collection. The size and  configuration of the ventricles and extra-axial CSF spaces are normal. The brain parenchyma is normal, without evidence of acute or chronic infarction. Vascular: No hyperdense vessel or unexpected vascular calcification. Skull: The visualized skull base, calvarium and extracranial soft tissues are normal. CT MAXILLOFACIAL FINDINGS Osseous: --Complex facial fracture types: No LeFort, zygomaticomaxillary complex or nasoorbitoethmoidal fracture. --Simple fracture types: None. --Mandible, hard palate and teeth: No acute abnormality. Orbits: The globes are intact. Normal appearance of the intra- and extraconal fat. Symmetric extraocular muscles. Sinuses: No fluid levels or advanced mucosal thickening. Soft tissues: Normal visualized extracranial soft tissues. IMPRESSION: 1. No acute intracranial abnormality. 2. No maxillofacial fracture. Electronically Signed   By: Ulyses Jarred M.D.   On: 04/09/2019 20:48       Assessment & Plan:    Principal Problem:   Weakness Active Problems:   Metastatic cancer to spine (HCC)   Acute lower UTI   Pancytopenia (HCC)  Weakness (generalized), but in particular legs Check MRI T spine, MRI L spine, MRI brain Dexamethasone 28m iv x1, then 441miv q6h PT/OT  consult Oncology consult  Metastatic cancer to spine Oncology consult  Acute lower UTI Urine culture Rocephin 1gm iv qday  Pancytopenia Check cbc in am  Abnormal liver function Check cmp in am  Tachycardia Tele Trop I q3h x3, D dimer If D dimer positive then CTA chest r/o PE Check cardiac echo  Hypertension Cont Amlodipine 7m59mo qday Cont Lasix 77m37m qday  Anxiety/ Depression Cont Effexor XR 77mg21mqday Cont Xanax 0.7mg p67mid prn   Gerd Cont PPI  DVT Prophylaxis-   Lovenox - SCDs   AM Labs Ordered, also please review Full Orders  Family Communication: Admission, patients condition and plan of care including tests being ordered have been discussed with the patient and husband who indicate understanding and agree with the plan and Code Status.  Code Status:  DNR, attempted call to husband to inform him that patient being admitted.   Admission status: /Inpatient: Based on patients clinical presentation and evaluation of above clinical data, I have made determination that patient meets Inpatient criteria at this time. Pt has high clinical risk of deterioration. Pt will require admission for lower ext weakness secondary to metastatic cancer and UTI.  Pt will require iv abx rocephin.  Pt needs oncology consult for evaluation of metastatic disease to spine. Pt will require inpatient  > 2 nites stay.   Time spent in minutes : 70   Courtenay Creger Jani Graveln 04/10/2019 at 1:27 AM

## 2019-04-10 NOTE — Evaluation (Signed)
Physical Therapy Evaluation Patient Details Name: Brandi Rodriguez MRN: [id number] DOB: 04/27/1961 Today's Date: 04/10/2019   History of Present Illness  Brandi Rodriguez  is a 58 y.o. female,  58 y.o. female with medical history significant for metastatic cancer who was sent to the [facility] ED from the cancer center 03/27/19 with reports of falls and lower extremity weakness. She reported lower extremity weakness left worse than right that started one day PTA with pain in her back also. One week prior she noticed that her right eyelid drooped.    Clinical Impression  Patient has difficulty with supine to sitting due to limited use of RUE secondary to chronic arthritis/weakness and requiring assistance to move legs due to paralysis.  Patient has decreased sensation in BLE, right worse than left and unable to actively dorsiflex feet, and unable to complete sit to stands or transfer using RW due to BLE weakness.  Patient able scoot over laterally to transfer to chair with Max assistance and fatigues easily requiring frequent rest breaks, put back to bed and encouraged to have family bring her wheelchair to hospital for transfer training using sliding board.  Patient will benefit from continued physical therapy in hospital and recommended venue below to increase strength, balance, endurance for safe ADLs and gait.    Follow Up Recommendations SNF;Supervision for mobility/OOB;Supervision/Assistance - 24 hour    Equipment Recommendations  None recommended by PT    Recommendations for Other Services       Precautions / Restrictions Precautions Precautions: Fall Restrictions Weight Bearing Restrictions: No      Mobility  Bed Mobility Overal bed mobility: Needs Assistance Bed Mobility: Supine to Sit;Sit to Supine     Supine to sit: Mod assist Sit to supine: Min assist;Mod assist   General bed mobility comments: limited use of RUE due to chronic  arthritis/weakness  Transfers Overall transfer level: Needs assistance Equipment used: Rolling walker (2 wheeled);1 person hand held assist Transfers: Sit to/from Stand;Lateral/Scoot Transfers Sit to Stand: Max assist        Lateral/Scoot Transfers: Mod assist;Max assist General transfer comment: limited to partial standing with RW due to BLE weakness, very slow labored movement completing lateral/scoot bed<>chair  Ambulation/Gait                Stairs            Wheelchair Mobility    Modified Rankin (Stroke Patients Only)       Balance Overall balance assessment: Needs assistance Sitting-balance support: Feet supported;No upper extremity supported Sitting balance-Leahy Scale: Fair Sitting balance - Comments: fair/good seated at bedside   Standing balance support: During functional activity;Bilateral upper extremity supported Standing balance-Leahy Scale: Poor Standing balance comment: unable to come to complete standing using RW                             Pertinent Vitals/Pain Pain Assessment: No/denies pain    Home Living Family/patient expects to be discharged to:: Private residence Living Arrangements: Spouse/significant other Available Help at Discharge: Family;Available PRN/intermittently Type of Home: House Home Access: Level entry     Home Layout: One level Home Equipment: Cane - single point;Walker - 2 wheels;Grab bars - toilet;Grab bars - tub/shower;Wheelchair - manual;Shower seat      Prior Function Level of Independence: Needs assistance   Gait / Transfers Assistance Needed: non-ambulatory for last 3 weeks, was using SPC for household distances, since assisted transfers  ADL's /  Homemaking Assistance Needed: assisted by family        Hand Dominance   Dominant Hand: Right    Extremity/Trunk Assessment   Upper Extremity Assessment Upper Extremity Assessment: Defer to OT evaluation    Lower Extremity  Assessment Lower Extremity Assessment: Generalized weakness;RLE deficits/detail;LLE deficits/detail RLE Deficits / Details: grossly 2 +/5 except ankle dorsiflexion 1/5 LLE Deficits / Details: grossly -3/5, except ankle dorisflexion 1/5    Cervical / Trunk Assessment Cervical / Trunk Assessment: Normal  Communication   Communication: No difficulties  Cognition Arousal/Alertness: Awake/alert Behavior During Therapy: WFL for tasks assessed/performed Overall Cognitive Status: Within Functional Limits for tasks assessed                                        General Comments      Exercises     Assessment/Plan    PT Assessment Patient needs continued PT services  PT Problem List Decreased strength;Decreased activity tolerance;Decreased balance;Decreased mobility       PT Treatment Interventions Gait training;Functional mobility training;Therapeutic activities;Therapeutic exercise;Wheelchair mobility training;Neuromuscular re-education;Balance training    PT Goals (Current goals can be found in the Care Plan section)  Acute Rehab PT Goals Patient Stated Goal: return home after rehab PT Goal Formulation: With patient Time For Goal Achievement: 04/24/19 Potential to Achieve Goals: Fair    Frequency Min 3X/week   Barriers to discharge        Co-evaluation               AM-PAC PT "6 Clicks" Mobility  Outcome Measure Help needed turning from your back to your side while in a flat bed without using bedrails?: A Lot Help needed moving from lying on your back to sitting on the side of a flat bed without using bedrails?: A Lot Help needed moving to and from a bed to a chair (including a wheelchair)?: A Lot Help needed standing up from a chair using your arms (e.g., wheelchair or bedside chair)?: Total Help needed to walk in hospital room?: Total Help needed climbing 3-5 steps with a railing? : Total 6 Click Score: 9    End of Session   Activity  Tolerance: Patient tolerated treatment well;Patient limited by fatigue Patient left: in bed;with call bell/phone within reach;with bed alarm set Nurse Communication: Mobility status PT Visit Diagnosis: Unsteadiness on feet (R26.81);History of falling (Z91.81);Other abnormalities of gait and mobility (R26.89)    Time: 9326-7124 PT Time Calculation (min) (ACUTE ONLY): 41 min   Charges:   PT Evaluation $PT Eval High Complexity: 1 High PT Treatments $Therapeutic Activity: 38-52 mins        2:06 PM, 04/10/19 Lonell Grandchild, MPT Physical Therapist with [facility] [phone] office [phone] mobile phone

## 2019-04-10 NOTE — Consult Note (Signed)
[facility] Consultation Oncology  Name: Brandi Rodriguez      MRN: [id number]    Location: Z308/M578-46  Date: 04/10/2019 Time:5:31 PM   REFERRING PHYSICIAN: Dr. Dyann Kief  REASON FOR CONSULT: Lower extremity weakness.   DIAGNOSIS: Metastatic poorly differentiated neuroendocrine carcinoma.  HISTORY OF PRESENT ILLNESS: Brandi Rodriguez is a 58 year old very pleasant female known to me from outpatient follow-up.  She was sent to the ER by her doctor when she was feeling weak in her legs.  She had known widespread osseous metastasis with epidural tumor at T9 and T12-L1 level for which she was started on radiation therapy at [facility] long.  She was felt not to be a candidate for any surgical intervention.  She received 8/10 treatments, last treatment on 04/08/2019.  As she was feeling very weak, she was told to forego treatment yesterday and come to the ER.  Patient has been on tapering dexamethasone.  She reports that she has been feeling very weak in her legs.  She also has lower extremity swelling bilaterally.  She had a MRI of the thoracic and lumbar spine done this morning.  She also had a brain MRI done.  She was started back on dexamethasone 10 mg IV followed by 4 mg IV every 6 hours.  She denies any back pains at this time.  PAST MEDICAL HISTORY:   Past Medical History:  Diagnosis Date  . Arthritis     ALLERGIES: Allergies  Allergen Reactions  . Hydrocodone Other (See Comments)    Makes heart pound      MEDICATIONS: I have reviewed the patient's current medications.     PAST SURGICAL HISTORY Past Surgical History:  Procedure Laterality Date  . AXILLARY LYMPH NODE BIOPSY Left 03/25/2019   Procedure: LEFT AXILLARY LYMPH NODE BIOPSY;  Surgeon: Aviva Signs, MD;  Location: [facility];  Service: General;  Laterality: Left;  . BACK SURGERY    . BREAST SURGERY      FAMILY HISTORY: Family History  Problem Relation Age of Onset  . Arthritis Mother   . Heart disease Father   . Heart  disease Sister   . Arthritis Brother   . Heart disease Sister     SOCIAL HISTORY:  reports that she quit smoking about 10 years ago. Her smoking use included cigarettes. She has a 15.00 pack-year smoking history. She has never used smokeless tobacco. She reports previous alcohol use. She reports that she does not use drugs.  PERFORMANCE STATUS: The patient's performance status is 3 - Symptomatic, >50% confined to bed  PHYSICAL EXAM: Most Recent Vital Signs: Blood pressure (!) 143/86, pulse (!) 111, temperature 98 F (36.7 C), temperature source Oral, resp. rate 18, height 5\' 6"  (1.676 m), weight 208 lb 15.9 oz (94.8 kg), last menstrual period 05/22/2011, SpO2 95 %. BP (!) 143/86 (BP Location: Left Wrist)   Pulse (!) 111   Temp 98 F (36.7 C) (Oral)   Resp 18   Ht 5\' 6"  (1.676 m)   Wt 208 lb 15.9 oz (94.8 kg)   LMP 05/22/2011   SpO2 95%   BMI 33.73 kg/m  General appearance: alert and cooperative Neck: no adenopathy, supple, symmetrical, trachea midline and thyroid not enlarged, symmetric, no tenderness/mass/nodules Lungs: clear to auscultation bilaterally Heart: regular rate and rhythm Abdomen: soft, non-tender; bowel sounds normal; no masses,  no organomegaly Extremities: 2+ edema bilaterally.  Bilaterally decreased strength in both lower extremities. Skin: Skin color, texture, turgor normal. No rashes or lesions Lymph nodes:  Cervical, supraclavicular, and axillary nodes normal. Neurologic: Grossly normal  LABORATORY DATA:  Results for orders placed or performed during the hospital encounter of 04/09/19 (from the past 48 hour(s))  POC CBG, ED     Status: Abnormal   Collection Time: 04/09/19  4:59 PM  Result Value Ref Range   Glucose-Capillary 103 (H) 70 - 99 mg/dL  Comprehensive metabolic panel     Status: Abnormal   Collection Time: 04/09/19  5:00 PM  Result Value Ref Range   Sodium 137 135 - 145 mmol/L   Potassium 4.0 3.5 - 5.1 mmol/L   Chloride 99 98 - 111 mmol/L    CO2 24 22 - 32 mmol/L   Glucose, Bld 117 (H) 70 - 99 mg/dL   BUN 21 (H) 6 - 20 mg/dL   Creatinine, Ser 0.49 0.44 - 1.00 mg/dL   Calcium 9.4 8.9 - 10.3 mg/dL   Total Protein 6.7 6.5 - 8.1 g/dL   Albumin 2.9 (L) 3.5 - 5.0 g/dL   AST 67 (H) 15 - 41 U/L   ALT 73 (H) 0 - 44 U/L   Alkaline Phosphatase 232 (H) 38 - 126 U/L   Total Bilirubin 1.3 (H) 0.3 - 1.2 mg/dL   GFR calc non Af Amer >60 >60 mL/min   GFR calc Af Amer >60 >60 mL/min   Anion gap 14 5 - 15    Comment: Performed at [facility], [address], [place], [place] [address]  CBC with Differential     Status: Abnormal   Collection Time: 04/09/19  5:00 PM  Result Value Ref Range   WBC 1.3 (LL) 4.0 - 10.5 K/uL    Comment: WHITE COUNT CONFIRMED ON SMEAR THIS CRITICAL RESULT HAS VERIFIED AND BEEN CALLED TO [place],H BY CHASE ISLEY ON 06 23 2020 AT 1850, AND HAS BEEN READ BACK.     RBC 3.84 (L) 3.87 - 5.11 MIL/uL   Hemoglobin 9.8 (L) 12.0 - 15.0 g/dL   HCT 31.9 (L) 36.0 - 46.0 %   MCV 83.1 80.0 - 100.0 fL   MCH 25.5 (L) 26.0 - 34.0 pg   MCHC 30.7 30.0 - 36.0 g/dL   RDW 14.6 11.5 - 15.5 %   Platelets 57 (L) 150 - 400 K/uL    Comment: PLATELET COUNT CONFIRMED BY SMEAR PLATELETS APPEAR DECREASED SPECIMEN CHECKED FOR CLOTS Immature Platelet Fraction may be clinically indicated, consider ordering this additional test [id number]    nRBC 4.0 (H) 0.0 - 0.2 %   Neutrophils Relative % 71 %   Neutro Abs 0.9 (L) 1.7 - 7.7 K/uL   Lymphocytes Relative 11 %   Lymphs Abs 0.1 (L) 0.7 - 4.0 K/uL   Monocytes Relative 9 %   Monocytes Absolute 0.1 0.1 - 1.0 K/uL   Eosinophils Relative 0 %   Eosinophils Absolute 0.0 0.0 - 0.5 K/uL   Basophils Relative 1 %   Basophils Absolute 0.0 0.0 - 0.1 K/uL   Immature Granulocytes 8 %   Abs Immature Granulocytes 0.10 (H) 0.00 - 0.07 K/uL    Comment: Performed at [facility], [address] [place], [place] [address]  Urinalysis, Routine w reflex microscopic     Status: Abnormal   Collection Time:  04/09/19  6:15 PM  Result Value Ref Range   Color, Urine YELLOW YELLOW   APPearance HAZY (A) CLEAR   Specific Gravity, Urine 1.017 1.005 - 1.030   pH 6.0 5.0 - 8.0   Glucose, UA NEGATIVE NEGATIVE mg/dL  Hgb urine dipstick SMALL (A) NEGATIVE   Bilirubin Urine NEGATIVE NEGATIVE   Ketones, ur 5 (A) NEGATIVE mg/dL   Protein, ur NEGATIVE NEGATIVE mg/dL   Nitrite NEGATIVE NEGATIVE   Leukocytes,Ua TRACE (A) NEGATIVE   RBC / HPF 0-5 0 - 5 RBC/hpf   WBC, UA 11-20 0 - 5 WBC/hpf   Bacteria, UA FEW (A) NONE SEEN   Squamous Epithelial / LPF 0-5 0 - 5   Mucus PRESENT     Comment: Performed at [facility], [address], [place], [place] [address]  Protime-INR     Status: None   Collection Time: 04/09/19 11:54 PM  Result Value Ref Range   Prothrombin Time 14.3 11.4 - 15.2 seconds   INR 1.1 0.8 - 1.2    Comment: (NOTE) INR goal varies based on device and disease states. Performed at [facility], [address], [place], [place] [address]   APTT     Status: None   Collection Time: 04/09/19 11:54 PM  Result Value Ref Range   aPTT 31 24 - 36 seconds    Comment: Performed at [facility], [address], [place], [place] [address]  D-dimer, quantitative (not at [facility])     Status: Abnormal   Collection Time: 04/09/19 11:54 PM  Result Value Ref Range   D-Dimer, Quant 8.42 (H) 0.00 - 0.50 ug/mL-FEU    Comment: (NOTE) At the manufacturer cut-off of 0.50 ug/mL FEU, this assay has been documented to exclude PE with a sensitivity and negative predictive value of 97 to 99%.  At this time, this assay has not been approved by the FDA to exclude DVT/VTE. Results should be correlated with clinical presentation. Performed at [facility], [address], [place], [place] [address]   Comprehensive metabolic panel     Status: Abnormal   Collection Time: 04/10/19  5:08 AM  Result Value Ref Range   Sodium 140 135 - 145 mmol/L   Potassium 3.9 3.5 - 5.1 mmol/L   Chloride 104 98 - 111 mmol/L   CO2 24 22 -  32 mmol/L   Glucose, Bld 129 (H) 70 - 99 mg/dL   BUN 18 6 - 20 mg/dL   Creatinine, Ser 0.42 (L) 0.44 - 1.00 mg/dL   Calcium 9.1 8.9 - 10.3 mg/dL   Total Protein 6.1 (L) 6.5 - 8.1 g/dL   Albumin 2.5 (L) 3.5 - 5.0 g/dL   AST 62 (H) 15 - 41 U/L   ALT 67 (H) 0 - 44 U/L   Alkaline Phosphatase 236 (H) 38 - 126 U/L   Total Bilirubin 0.7 0.3 - 1.2 mg/dL   GFR calc non Af Amer >60 >60 mL/min   GFR calc Af Amer >60 >60 mL/min   Anion gap 12 5 - 15    Comment: Performed at [facility], [address] [place], [place] [address]  CBC     Status: Abnormal   Collection Time: 04/10/19  5:08 AM  Result Value Ref Range   WBC 1.3 (LL) 4.0 - 10.5 K/uL    Comment: REPEATED TO VERIFY WHITE COUNT CONFIRMED ON SMEAR CRITICAL VALUE NOTED.  VALUE IS CONSISTENT WITH PREVIOUSLY REPORTED AND CALLED VALUE. THIS CRITICAL RESULT HAS VERIFIED AND BEEN CALLED TO TATE,R BY SHERRI HUFFINES ON 06 24 2020 AT 0652, AND HAS BEEN READ BACK.     RBC 3.47 (L) 3.87 - 5.11 MIL/uL   Hemoglobin 8.8 (L) 12.0 - 15.0 g/dL   HCT 28.6 (L) 36.0 - 46.0 %  MCV 82.4 80.0 - 100.0 fL   MCH 25.4 (L) 26.0 - 34.0 pg   MCHC 30.8 30.0 - 36.0 g/dL   RDW 14.8 11.5 - 15.5 %   Platelets 43 (L) 150 - 400 K/uL    Comment: REPEATED TO VERIFY PLATELET COUNT CONFIRMED BY SMEAR SPECIMEN CHECKED FOR CLOTS Immature Platelet Fraction may be clinically indicated, consider ordering this additional test [id number]    nRBC 3.9 (H) 0.0 - 0.2 %    Comment: Performed at [facility], [address] [place], [place] [address]      RADIOGRAPHY: Ct Head Wo Contrast  Result Date: 04/09/2019 CLINICAL DATA:  Fall EXAM: CT HEAD WITHOUT CONTRAST CT MAXILLOFACIAL WITHOUT CONTRAST TECHNIQUE: Multidetector CT imaging of the head and maxillofacial structures were performed using the standard protocol without intravenous contrast. Multiplanar CT image reconstructions of the maxillofacial structures were also generated. COMPARISON:  None. FINDINGS: CT HEAD FINDINGS  Brain: There is no mass, hemorrhage or extra-axial collection. The size and configuration of the ventricles and extra-axial CSF spaces are normal. The brain parenchyma is normal, without evidence of acute or chronic infarction. Vascular: No hyperdense vessel or unexpected vascular calcification. Skull: The visualized skull base, calvarium and extracranial soft tissues are normal. CT MAXILLOFACIAL FINDINGS Osseous: --Complex facial fracture types: No LeFort, zygomaticomaxillary complex or nasoorbitoethmoidal fracture. --Simple fracture types: None. --Mandible, hard palate and teeth: No acute abnormality. Orbits: The globes are intact. Normal appearance of the intra- and extraconal fat. Symmetric extraocular muscles. Sinuses: No fluid levels or advanced mucosal thickening. Soft tissues: Normal visualized extracranial soft tissues. IMPRESSION: 1. No acute intracranial abnormality. 2. No maxillofacial fracture. Electronically Signed   By: Ulyses Jarred M.D.   On: 04/09/2019 20:48   Mr Jeri Cos GY Contrast  Result Date: 04/10/2019 CLINICAL DATA:  Bilateral leg weakness. Altered mental status. Metastatic cancer. EXAM: MRI HEAD WITHOUT AND WITH CONTRAST TECHNIQUE: Multiplanar, multiecho pulse sequences of the brain and surrounding structures were obtained without and with intravenous contrast. CONTRAST:  7.5 mL Gadavist COMPARISON:  Head CT 04/09/2019 FINDINGS: The study is mildly motion degraded. Brain: There is no evidence of acute infarct, intracranial hemorrhage, midline shift, or extra-axial fluid collection. The ventricles and sulci are within normal limits for age. No significant cerebral white matter disease is evident. An enhancing extra-axial mass in the left middle cranial fossa involving the sphenoid wing measures 20 x 11 x 19 mm. There is minimal associated mass effect on the left temporal tip without brain edema. The pituitary gland is enlarged with evidence of an approximately 2 cm hypoenhancing sellar  mass extending into the posterior aspect of the right cavernous sinus and invading the superior aspect of the clivus on the right. Vascular: Major intracranial vascular flow voids are preserved. Skull and upper cervical spine: Superior clival involvement by the sellar mass. No destructive skull lesion seen elsewhere. Sinuses/Orbits: Unremarkable orbits. Mild mucosal thickening in the sphenoid sinuses. Clear mastoid air cells. Other: None. IMPRESSION: 1. No infarct. 2. 2 cm extra-axial mass in the left middle cranial fossa which may represent a meningioma or metastasis. 3. 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 Electronically Signed   By: Logan Bores M.D.   On: 04/10/2019 10:27   Mr Thoracic Spine W Wo Contrast  Result Date: 04/10/2019 CLINICAL DATA:  Bilateral leg weakness. Altered mental status. Metastatic cancer. EXAM: MRI THORACIC WITHOUT AND WITH CONTRAST TECHNIQUE: Multiplanar and multiecho pulse sequences of the thoracic spine were obtained without  and with intravenous contrast. CONTRAST:  7.5 mL Gadavist COMPARISON:  03/27/2019 FINDINGS: Alignment: Slight right convex curvature of the thoracic spine. No significant listhesis. Vertebrae: Widespread marrow replacement and heterogeneous enhancement throughout the visualized vertebral bodies and posterior elements consistent with known metastases. Pathologic T9 compression fracture with 40% vertebral body height loss, progressed from the prior study. Destructive lesion involving the right-sided posterior elements at T3 and adjacent third rib with epidural extension into the right lateral aspect of the spinal canal and right T3 neural foramen, stable to slightly progressed without significant spinal stenosis. Small volume residual ventral epidural tumor at T9, decreased from prior and resulting in borderline spinal stenosis without cord compression. Tumor involving the left-sided vertebral body and posterior  elements at L1, left T12 and L1 neural foramina, and left lateral epidural space in the canal has decreased in size, now measuring 3.8 cm in maximal dimension (previously 4.9 cm), and the volume of epidural tumor in the canal has greatly decreased without residual spinal stenosis. Metastases involve the sternum with a minimally displaced oblique fracture of the sternal body noted. Cord:  Normal signal and morphology. Paraspinal and other soft tissues: Incomplete imaging of known large posterior left lung and chest wall mass. Extensive left-sided pleural tumor with trace left pleural effusion. 1.3 cm left upper pole renal cyst. Disc levels: Mild multilevel disc bulging and facet arthrosis. No significant spinal stenosis. Moderate right neural foraminal stenosis at T2-3 due to disc and osteophyte. IMPRESSION: 1. Known widespread osseous metastases and left chest wall/posterior lung mass. 2. Decreased epidural tumor at T9 and T12-L1 without associated spinal stenosis. 3. Pathologic T9 compression fracture with progressive vertebral body height loss. 4. Stable to slightly progressive destructive right-sided lesion at T3 with neural foraminal involvement and epidural extension in the right lateral spinal canal. No associated spinal stenosis. 5. Minimally displaced pathologic sternal body fracture. Electronically Signed   By: Logan Bores M.D.   On: 04/10/2019 10:57   Mr Lumbar Spine W Wo Contrast  Result Date: 04/10/2019 CLINICAL DATA:  Bilateral leg weakness.  Metastatic cancer. EXAM: MRI LUMBAR SPINE WITHOUT AND WITH CONTRAST TECHNIQUE: Multiplanar and multiecho pulse sequences of the lumbar spine were obtained without and with intravenous contrast. CONTRAST:  7.5 mL Gadavist COMPARISON:  03/01/2019 FINDINGS: Segmentation:  Standard. Alignment:  Normal. Vertebrae: Widespread marrow replacement and heterogeneous enhancement throughout the spine and visualized sacrum/pelvis consistent with known metastases.  Pathologic compression fractures at L1 and L5 with progressive vertebral body height loss now measuring 25% and 55%, respectively. Diminished left-sided epidural tumor at T12-L1 as described on separate thoracic spine MRI. New 1.8 cm focus of dorsal epidural tumor at L3 resulting in mild spinal stenosis. Marked progression of now large volume epidural tumor centered at L5 and extending superiorly to L4 and inferiorly to S1 with severe spinal stenosis and complete effacement of the thecal sac. Extraosseous tumor extension anterior to L5 as well. Progressive destructive sacral tumor with extraosseous tumor extension about the left sacral ala with involvement of the left S1 neural foramen. Conus medullaris and cauda equina: Conus extends to the L1-2 level. New curvilinear enhancement involving multiple cauda equina nerve roots in the mid upper lumbar spine as well as thin enhancement along the surface of the conus. Paraspinal and other soft tissues: Bilateral renal cysts. Disc levels: Mild disc bulging and mild facet hypertrophy from T12-L1 to L3-4 without stenosis. Severe spinal stenosis from L4-S1 due to tumor. IMPRESSION: 1. Progressive, large volume epidural tumor from L4-S1 with severe  spinal stenosis. 2. 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 3. New small volume dorsal epidural tumor at L3 with mild spinal stenosis. 4. L1 and L5 pathologic compression fractures with progressive vertebral body height loss. 5. Progressive destructive left-sided sacral tumor with involvement of the left S1 neural foramen. Electronically Signed   By: Logan Bores M.D.   On: 04/10/2019 11:29   Dg Chest Portable 1 View  Result Date: 04/09/2019 CLINICAL DATA:  Loss of sensation to bilateral lower extremities. Reported history of metastatic carcinoma and mass left posterior chest wall by recent MRI of the thoracic spine. EXAM: PORTABLE CHEST 1 VIEW  COMPARISON:  MRI of the thoracic spine on 03/27/2019 FINDINGS: Lobulated soft tissue mass projects over the lateral left chest and is likely causing bony destruction a segment of the left sixth rib. Aeration at the left lung base has improved since the prior chest x-ray. No significant pleural fluid identified. No evidence of pneumothorax. IMPRESSION: Large soft tissue mass of the left chest causing visible bony destruction of the left sixth rib. Electronically Signed   By: Aletta Edouard M.D.   On: 04/09/2019 17:09   Ct Maxillofacial Wo Contrast  Result Date: 04/09/2019 CLINICAL DATA:  Fall EXAM: CT HEAD WITHOUT CONTRAST CT MAXILLOFACIAL WITHOUT CONTRAST TECHNIQUE: Multidetector CT imaging of the head and maxillofacial structures were performed using the standard protocol without intravenous contrast. Multiplanar CT image reconstructions of the maxillofacial structures were also generated. COMPARISON:  None. FINDINGS: CT HEAD FINDINGS Brain: There is no mass, hemorrhage or extra-axial collection. The size and configuration of the ventricles and extra-axial CSF spaces are normal. The brain parenchyma is normal, without evidence of acute or chronic infarction. Vascular: No hyperdense vessel or unexpected vascular calcification. Skull: The visualized skull base, calvarium and extracranial soft tissues are normal. CT MAXILLOFACIAL FINDINGS Osseous: --Complex facial fracture types: No LeFort, zygomaticomaxillary complex or nasoorbitoethmoidal fracture. --Simple fracture types: None. --Mandible, hard palate and teeth: No acute abnormality. Orbits: The globes are intact. Normal appearance of the intra- and extraconal fat. Symmetric extraocular muscles. Sinuses: No fluid levels or advanced mucosal thickening. Soft tissues: Normal visualized extracranial soft tissues. IMPRESSION: 1. No acute intracranial abnormality. 2. No maxillofacial fracture. Electronically Signed   By: Ulyses Jarred M.D.   On: 04/09/2019 20:48        ASSESSMENT and PLAN:  1.  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 - MRI of the thoracic spine on 04/10/2019 shows decreased epidural tumor at T9 and T12-L1 without associated spinal stenosis.  Pathological T9 compression fracture with progressive vertebral body height loss.  Stable to slightly progressive destructive right-sided lesion at the T3 with neural foraminal involvement with epidural extension in the right lateral spinal canal. - MRI of the lumbar spine shows progressive large epidural tumor from L4-S1 with severe spinal stenosis.  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  New small volume dorsal epidural tumor at L3 with mild spinal stenosis.  L1-L5 pathological compression fractures with progressive vertebral body height loss. -MRI of the brain with and without contrast shows enhancing extra-axial mass in the left middle cranial fossa involving the sphenoid wing measuring 20 x 11 x 19 mm.  Minimal associated mass-effect on the left temporal tip without brain edema. -The pituitary gland is enlarged with evidence of an approximately 2 cm hypoenhancing  sellar mass extending into the posterior aspect of the right cavernous sinus and invading the superior aspect of the clivus on the right. - We will talk to radiation oncology and get their opinion if we need to radiate progressive large epidural tumor from L4-S1 and new small volume dorsal epidural tumor at L3. -Continue dexamethasone in the meanwhile.   All questions were answered. The patient knows to call the clinic with any problems, questions or concerns. We can certainly see the patient much sooner if necessary.    Derek Jack

## 2019-04-10 NOTE — Progress Notes (Signed)
OT Cancellation Note  Patient Details Name: Brandi Rodriguez MRN: [id number] DOB: 21-Aug-1961   Cancelled Treatment:    Reason Eval/Treat Not Completed: Patient at procedure or test/ unavailable. Pt off floor for MRI. Will attempt evaluation at a later time.  Guadelupe Sabin, OTR/L  [phone] 04/10/2019, 8:36 AM

## 2019-04-10 NOTE — ED Notes (Signed)
Pt given ginger ale to drink upon request

## 2019-04-10 NOTE — Progress Notes (Signed)
Notified b kyene via [facility] page system that pt has not has CTA due to inability to obtain appropriate IV. Provider suggests to continue to attempt because pt needs scan

## 2019-04-10 NOTE — Plan of Care (Signed)
  Problem: Education: Goal: Knowledge of General Education information will improve Description Including pain rating scale, medication(s)/side effects and non-pharmacologic comfort measures Outcome: Progressing   Problem: Health Behavior/Discharge Planning: Goal: Ability to manage health-related needs will improve Outcome: Progressing   

## 2019-04-10 NOTE — Plan of Care (Signed)
  Problem: Acute Rehab PT Goals(only PT should resolve) Goal: Pt Will Go Supine/Side To Sit Outcome: Progressing Flowsheets (Taken 04/10/2019 1409) Pt will go Supine/Side to Sit:  with minimal assist  with moderate assist Goal: Patient Will Transfer Sit To/From Stand Outcome: Progressing Flowsheets (Taken 04/10/2019 1409) Patient will transfer sit to/from stand:  with moderate assist  with maximum assist Goal: Pt Will Transfer Bed To Chair/Chair To Bed Outcome: Progressing Flowsheets (Taken 04/10/2019 1409) Pt will Transfer Bed to Chair/Chair to Bed:  with min assist  with mod assist Note: Using sliding board and/or lateral/scoot transfer Goal: Pt Will Perform Standing Balance Or Pre-Gait Outcome: Progressing Flowsheets (Taken 04/10/2019 1409) Pt will perform standing balance or pre-gait:  with maximum assist  with bilateral UE support Note: Using RW   2:11 PM, 04/10/19 Lonell Grandchild, MPT Physical Therapist with [facility] [phone] office [phone] mobile phone

## 2019-04-10 NOTE — ED Notes (Signed)
Attempt to get patient in the bed from the stretcher. Pt unable to stand even with rn and walker assistance

## 2019-04-10 NOTE — Progress Notes (Signed)
Patient seen and examined.  Admitted after midnight secondary to increase  weakness, poor balance and significant decrease in sensation and strength in her lower extremities bilaterally.  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 Patient also reported decrease in appetite. No fever, no nausea, no vomiting. Chronically SOB and using oxygen.  There was a concern for infection in her urine and was empirically started on Rocephin, culture pending at this moment.  Patient remains at high risk for falls, unsafe to be managed at home currently due to increased deconditioning and decline.  His refer to H&P written by Dr. Maudie Mercury on 04/10/2019.  Plan: -Continue antibiotics for now -Continue supportive care -Continue steroids and follow results of imaging studies -Follow oncology recommendations -Follow PT/OT assessment and recommendations.  Barton Dubois  MD [phone]

## 2019-04-11 ENCOUNTER — Ambulatory Visit: Payer: BC Managed Care – PPO

## 2019-04-11 DIAGNOSIS — J9611 Chronic respiratory failure with hypoxia: Secondary | ICD-10-CM

## 2019-04-11 LAB — NOVEL CORONAVIRUS, NAA (HOSP ORDER, SEND-OUT TO REF LAB; TAT 18-24 HRS): SARS-CoV-2, NAA: NOT DETECTED

## 2019-04-11 NOTE — NC FL2 (Signed)
[place] LEVEL OF CARE SCREENING TOOL     IDENTIFICATION  Patient Name: Brandi Rodriguez Birthdate: April 14, 1961 Sex: female Admission Date (Current Location): 04/09/2019  [facility] and [place] Number:  [employer] and Address:  [place] [address], [place]      Provider Number: [id number]  Attending Physician Name and Address:  Barton Dubois, MD  Relative Name and Phone Number:       Current Level of Care: Other (Comment)(observation) Recommended Level of Care: [place] Prior Approval Number:    Date Approved/Denied:   PASRR Number: [id number]  Discharge Plan: SNF    Current Diagnoses: Patient Active Problem List   Diagnosis Date Noted  . Acute lower UTI 04/10/2019  . Pancytopenia ([place]) 04/10/2019  . Weakness 04/09/2019  . Follow-up examination following surgery 04/03/2019  . Cord compression ([place]) 03/28/2019  . Metastatic cancer to spine ([place]) 03/27/2019  . Falls 03/27/2019  . Axillary lymphadenopathy   . Lytic lesion of bone on x-ray 03/12/2019    Orientation RESPIRATION BLADDER Height & Weight     Self, Time, Situation, Place  Normal Incontinent Weight: 214 lb 11.7 oz (97.4 kg) Height:  5\' 6"  (167.6 cm)  BEHAVIORAL SYMPTOMS/MOOD NEUROLOGICAL BOWEL NUTRITION STATUS      Continent Diet(heart healthy)  AMBULATORY STATUS COMMUNICATION OF NEEDS Skin   Extensive Assist Verbally Normal                       Personal Care Assistance Level of Assistance  Bathing, Feeding, Dressing Bathing Assistance: Limited assistance Feeding assistance: Independent Dressing Assistance: Limited assistance     Functional Limitations Info  Sight, Hearing, Speech Sight Info: Adequate Hearing Info: Adequate Speech Info: Adequate    SPECIAL CARE FACTORS FREQUENCY  PT (By licensed PT), OT (By licensed OT)     PT Frequency: 5x/week OT Frequency: 3x/week            Contractures  Contractures Info: Not present    Additional Factors Info  Code Status, Allergies, Psychotropic Code Status Info: DNR Allergies Info: hydrocodone Psychotropic Info: Xanax, Effexor XR         Current Medications (04/11/2019):  This is the current hospital active medication list Current Facility-Administered Medications  Medication Dose Route Frequency Provider Last Rate Last Dose  . 0.9 %  sodium chloride infusion  1,000 mL Intravenous Continuous Lily Kocher, PA-C      . ALPRAZolam Duanne Moron) tablet 0.5 mg  0.5 mg Oral BID PRN Jani Gravel, MD   0.5 mg at 04/11/19 1310  . amLODipine (NORVASC) tablet 5 mg  5 mg Oral Daily Jani Gravel, MD   5 mg at 04/11/19 0915  . cefTRIAXone (ROCEPHIN) 1 g in sodium chloride 0.9 % 100 mL IVPB  1 g Intravenous Q24H Jani Gravel, MD 200 mL/hr at 04/11/19 0110 1 g at 04/11/19 0110  . dexamethasone (DECADRON) tablet 4 mg  4 mg Oral Q6H Jani Gravel, MD   4 mg at 04/11/19 1310  . feeding supplement (PRO-STAT SUGAR FREE 64) liquid 30 mL  30 mL Oral BID Jani Gravel, MD   30 mL at 04/11/19 0915  . furosemide (LASIX) tablet 20 mg  20 mg Oral Daily Jani Gravel, MD   20 mg at 04/11/19 0915  . ondansetron (ZOFRAN) tablet 4 mg  4 mg Oral Q6H PRN Jani Gravel, MD      . pantoprazole (PROTONIX) EC tablet 40 mg  40  mg Oral Daily Jani Gravel, MD   40 mg at 04/11/19 0915  . polyethylene glycol (MIRALAX / GLYCOLAX) packet 17 g  17 g Oral Daily PRN Jani Gravel, MD   17 g at 04/10/19 1850  . traMADol (ULTRAM) tablet 50 mg  50 mg Oral Q6H PRN Jani Gravel, MD   50 mg at 04/11/19 1310  . venlafaxine XR (EFFEXOR-XR) 24 hr capsule 75 mg  75 mg Oral QHS Jani Gravel, MD   75 mg at 04/10/19 2207     Discharge Medications: Please see discharge summary for a list of discharge medications.  Relevant Imaging Results:  Relevant Lab Results:   Additional Information SSN [id number] Clydene Pugh, LCSW

## 2019-04-11 NOTE — Progress Notes (Signed)
PROGRESS NOTE    Brandi Rodriguez  KDX:[id number] DOB: 28-Sep-1961 DOA: 04/09/2019 PCP: Manon Hilding, MD     Brief Narrative:  58 year old female with a past medical history significant for metastatic lung cancer, with secondary Horner syndrome, chronic respiratory failure requiring 3 L of nasal cannula supplementation and a spinal cord affectation causing urinary retention (Foley dependent) and increased lower extremity weakness.  Who presented with worsening generalized weakness and inability to walk or transfer.  Patient contacted oncology service who has recommended return to emergency department for further evaluation. Imaging studies demonstrated decrease epidural tumor at T9 and T12-L1 without spinal stenosis.  But there is a findings of progressive large epidural tumor from L4-S1 and severe spinal stenosis.  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  Minimal associated mass-effect appreciated.  Case discussed by Dr. Delton Coombes with Dr. Tammi Klippel (radiation oncologist) and has recommended transfer to [facility] for initiation of radiation treatments for lumbar spine and also the brain.   Assessment & Plan: 1-generalized weakness weakness: Secondary to metastatic brain lesion and invasive metastases into spinal cord (L4-S1) -Patient will be transferred to [facility] long hospital for radiation therapy -Continue steroids -Continue supportive care and physical therapy -Once medically stable plan is to pursuit nursing home placement for rehabilitation.  2-presumed UTI -Patient is afebrile -Currently no complaining of dysuria -Urine culture pending -Continue IV Rocephin.  3-essential hypertension -Continue amlodipine and  Lasix.  4-pancytopenia -Blood counts stable overall -Continue to follow trend. -Patient high risk for infection due to suppressed immune system in the setting of malignancy and chemotherapy  5-lung cancer with a spine/brain metastases -Continue treatment as dictated by oncology service.  6-anxiety/depression -Continue Effexor and as needed Xanax. -Mood overall stable -No suicidal ideation or hallucinations.  7-GERD -Continue PPI  8-physical deconditioning and generalized weakness -Physical therapy has evaluated patient and has recommended skilled nursing facility for rehabilitation once medically stable.  9-chronic respiratory failure -Continue oxygen supplementation as needed. -Currently stable.  DVT prophylaxis: Lovenox  Code Status: Full code Family Communication: No family at bedside; patient expressed that she will update them personally. Disposition Plan: Patient will be transferred to [facility] for initiation of radiation therapy.  Consultants:   Oncology service  Radiation oncology  Procedures:   See below for x-ray reports.  Antimicrobials:  Anti-infectives (From admission, onward)   Start     Dose/Rate Route Frequency Ordered Stop   04/10/19 0130  cefTRIAXone (ROCEPHIN) 1 g in sodium chloride 0.9 % 100 mL IVPB     1 g 200 mL/hr over 30 Minutes Intravenous Every 24 hours 04/10/19 0125     04/09/19 1945  cephALEXin (KEFLEX) capsule 500 mg     500 mg Oral  Once 04/09/19 1941 04/09/19 1945       Subjective: Afebrile, reports no nausea, no vomiting, stable breathing and no chest pain.  Continues to be severely weak and deconditioned.   Objective: Vitals:   04/10/19 1411 04/10/19 2120 04/11/19 0507 04/11/19 1435  BP: (!) 143/86 (!) 134/93 (!) 156/91 (!) 171/84  Pulse: (!) 111 (!) 105 96 97  Resp: 18 18 18 18   Temp: 98 F (36.7 C) 98 F (36.7 C) 98.2 F (36.8 C)   TempSrc: Oral Oral  SpO2:  98% 97% 97%  Weight:   97.4 kg   Height:         Intake/Output Summary (Last 24 hours) at 04/11/2019 1454 Last data filed at 04/11/2019 0835 Gross per 24 hour  Intake 800 ml  Output 1925 ml  Net -1125 ml   Filed Weights   04/09/19 1543 04/10/19 0629 04/11/19 0507  Weight: 100.7 kg 94.8 kg 97.4 kg    Examination: General exam: Alert, awake, oriented x 3, continues to be severely weak and deconditioned; No fever, no chest pain, no nausea or vomiting.  Poor balance demonstrated during examination by PT.  Right ptosis appreciated on exam. Respiratory system: Clear to auscultation. Respiratory effort normal. Cardiovascular system:RRR. No murmurs, rubs, gallops. Gastrointestinal system: Abdomen is nondistended, soft and nontender. No organomegaly or masses felt. Normal bowel sounds heard. Central nervous system: Alert and oriented.  No pronation drift; patient with 2-3/5 lower extremity weakness.  Extremities: No cyanosis or clubbing.  Trace edema bilaterally. Skin: No rashes, lesions or ulcers Psychiatry: Judgement and insight appear normal. Mood & affect appropriate.     Data Reviewed: I have personally reviewed following labs and imaging studies  CBC: Recent Labs  Lab 04/09/19 1700 04/10/19 0508  WBC 1.3* 1.3*  NEUTROABS 0.9*  --   HGB 9.8* 8.8*  HCT 31.9* 28.6*  MCV 83.1 82.4  PLT 57* 43*   Basic Metabolic Panel: Recent Labs  Lab 04/09/19 1700 04/10/19 0508  NA 137 140  K 4.0 3.9  CL 99 104  CO2 24 24  GLUCOSE 117* 129*  BUN 21* 18  CREATININE 0.49 0.42*  CALCIUM 9.4 9.1   GFR: Estimated Creatinine Clearance: 89.1 mL/min (A) (by C-G formula based on SCr of 0.42 mg/dL (L)).   Liver Function Tests: Recent Labs  Lab 04/09/19 1700 04/10/19 0508  AST 67* 62*  ALT 73* 67*  ALKPHOS 232* 236*  BILITOT 1.3* 0.7  PROT 6.7 6.1*  ALBUMIN 2.9* 2.5*   Coagulation Profile: Recent Labs  Lab 04/09/19 2354  INR 1.1    Recent Labs  Lab 04/09/19 1659  GLUCAP 103*   Urine analysis:    Component Value  Date/Time   COLORURINE YELLOW 04/09/2019 1815   APPEARANCEUR HAZY (A) 04/09/2019 1815   LABSPEC 1.017 04/09/2019 1815   PHURINE 6.0 04/09/2019 1815   GLUCOSEU NEGATIVE 04/09/2019 1815   HGBUR SMALL (A) 04/09/2019 1815   BILIRUBINUR NEGATIVE 04/09/2019 1815   KETONESUR 5 (A) 04/09/2019 1815   PROTEINUR NEGATIVE 04/09/2019 1815   NITRITE NEGATIVE 04/09/2019 1815   LEUKOCYTESUR TRACE (A) 04/09/2019 1815    Recent Results (from the past 240 hour(s))  Urine Culture     Status: Abnormal (Preliminary result)   Collection Time: 04/09/19  6:15 PM   Specimen: Urine, Catheterized  Result Value Ref Range Status   Specimen Description   Final    URINE, CATHETERIZED Performed at [facility], [address], [place], [place] [address]    Special Requests   Final    Normal Performed at [facility], [address], [place], [place] [address]    Culture (A)  Final    80,000 COLONIES/mL ESCHERICHIA COLI SUSCEPTIBILITIES TO FOLLOW Performed at [facility], [place] [address] [place], [place] [address]    Report Status PENDING  Incomplete    Radiology Studies: Ct Head Wo Contrast  Result Date: 04/09/2019 CLINICAL DATA:  Fall EXAM: CT HEAD WITHOUT CONTRAST CT MAXILLOFACIAL WITHOUT CONTRAST TECHNIQUE: Multidetector CT imaging of the  head and maxillofacial structures were performed using the standard protocol without intravenous contrast. Multiplanar CT image reconstructions of the maxillofacial structures were also generated. COMPARISON:  None. FINDINGS: CT HEAD FINDINGS Brain: There is no mass, hemorrhage or extra-axial collection. The size and configuration of the ventricles and extra-axial CSF spaces are normal. The brain parenchyma is normal, without evidence of acute or chronic infarction. Vascular: No hyperdense vessel or unexpected vascular calcification. Skull: The visualized skull base, calvarium and extracranial soft tissues are normal. CT MAXILLOFACIAL FINDINGS Osseous: --Complex facial  fracture types: No LeFort, zygomaticomaxillary complex or nasoorbitoethmoidal fracture. --Simple fracture types: None. --Mandible, hard palate and teeth: No acute abnormality. Orbits: The globes are intact. Normal appearance of the intra- and extraconal fat. Symmetric extraocular muscles. Sinuses: No fluid levels or advanced mucosal thickening. Soft tissues: Normal visualized extracranial soft tissues. IMPRESSION: 1. No acute intracranial abnormality. 2. No maxillofacial fracture. Electronically Signed   By: Ulyses Jarred M.D.   On: 04/09/2019 20:48   Mr Jeri Cos GE Contrast  Result Date: 04/10/2019 CLINICAL DATA:  Bilateral leg weakness. Altered mental status. Metastatic cancer. EXAM: MRI HEAD WITHOUT AND WITH CONTRAST TECHNIQUE: Multiplanar, multiecho pulse sequences of the brain and surrounding structures were obtained without and with intravenous contrast. CONTRAST:  7.5 mL Gadavist COMPARISON:  Head CT 04/09/2019 FINDINGS: The study is mildly motion degraded. Brain: There is no evidence of acute infarct, intracranial hemorrhage, midline shift, or extra-axial fluid collection. The ventricles and sulci are within normal limits for age. No significant cerebral white matter disease is evident. An enhancing extra-axial mass in the left middle cranial fossa involving the sphenoid wing measures 20 x 11 x 19 mm. There is minimal associated mass effect on the left temporal tip without brain edema. The pituitary gland is enlarged with evidence of an approximately 2 cm hypoenhancing sellar mass extending into the posterior aspect of the right cavernous sinus and invading the superior aspect of the clivus on the right. Vascular: Major intracranial vascular flow voids are preserved. Skull and upper cervical spine: Superior clival involvement by the sellar mass. No destructive skull lesion seen elsewhere. Sinuses/Orbits: Unremarkable orbits. Mild mucosal thickening in the sphenoid sinuses. Clear mastoid air cells. Other:  None. IMPRESSION: 1. No infarct. 2. 2 cm extra-axial mass in the left middle cranial fossa which may represent a meningioma or metastasis. 3. 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 Electronically Signed   By: Logan Bores M.D.   On: 04/10/2019 10:27   Mr Thoracic Spine W Wo Contrast  Result Date: 04/10/2019 CLINICAL DATA:  Bilateral leg weakness. Altered mental status. Metastatic cancer. EXAM: MRI THORACIC WITHOUT AND WITH CONTRAST TECHNIQUE: Multiplanar and multiecho pulse sequences of the thoracic spine were obtained without and with intravenous contrast. CONTRAST:  7.5 mL Gadavist COMPARISON:  03/27/2019 FINDINGS: Alignment: Slight right convex curvature of the thoracic spine. No significant listhesis. Vertebrae: Widespread marrow replacement and heterogeneous enhancement throughout the visualized vertebral bodies and posterior elements consistent with known metastases. Pathologic T9 compression fracture with 40% vertebral body height loss, progressed from the prior study. Destructive lesion involving the right-sided posterior elements at T3 and adjacent third rib with epidural extension into the right lateral aspect of the spinal canal and right T3 neural foramen, stable to slightly progressed without significant spinal stenosis. Small volume residual ventral epidural tumor at T9, decreased from prior and resulting in borderline spinal stenosis without cord compression. Tumor involving the left-sided vertebral body and posterior elements at  L1, left T12 and L1 neural foramina, and left lateral epidural space in the canal has decreased in size, now measuring 3.8 cm in maximal dimension (previously 4.9 cm), and the volume of epidural tumor in the canal has greatly decreased without residual spinal stenosis. Metastases involve the sternum with a minimally displaced oblique fracture of the sternal body noted. Cord:  Normal signal and morphology. Paraspinal and other  soft tissues: Incomplete imaging of known large posterior left lung and chest wall mass. Extensive left-sided pleural tumor with trace left pleural effusion. 1.3 cm left upper pole renal cyst. Disc levels: Mild multilevel disc bulging and facet arthrosis. No significant spinal stenosis. Moderate right neural foraminal stenosis at T2-3 due to disc and osteophyte. IMPRESSION: 1. Known widespread osseous metastases and left chest wall/posterior lung mass. 2. Decreased epidural tumor at T9 and T12-L1 without associated spinal stenosis. 3. Pathologic T9 compression fracture with progressive vertebral body height loss. 4. Stable to slightly progressive destructive right-sided lesion at T3 with neural foraminal involvement and epidural extension in the right lateral spinal canal. No associated spinal stenosis. 5. Minimally displaced pathologic sternal body fracture. Electronically Signed   By: Logan Bores M.D.   On: 04/10/2019 10:57   Mr Lumbar Spine W Wo Contrast  Result Date: 04/10/2019 CLINICAL DATA:  Bilateral leg weakness.  Metastatic cancer. EXAM: MRI LUMBAR SPINE WITHOUT AND WITH CONTRAST TECHNIQUE: Multiplanar and multiecho pulse sequences of the lumbar spine were obtained without and with intravenous contrast. CONTRAST:  7.5 mL Gadavist COMPARISON:  03/01/2019 FINDINGS: Segmentation:  Standard. Alignment:  Normal. Vertebrae: Widespread marrow replacement and heterogeneous enhancement throughout the spine and visualized sacrum/pelvis consistent with known metastases. Pathologic compression fractures at L1 and L5 with progressive vertebral body height loss now measuring 25% and 55%, respectively. Diminished left-sided epidural tumor at T12-L1 as described on separate thoracic spine MRI. New 1.8 cm focus of dorsal epidural tumor at L3 resulting in mild spinal stenosis. Marked progression of now large volume epidural tumor centered at L5 and extending superiorly to L4 and inferiorly to S1 with severe spinal  stenosis and complete effacement of the thecal sac. Extraosseous tumor extension anterior to L5 as well. Progressive destructive sacral tumor with extraosseous tumor extension about the left sacral ala with involvement of the left S1 neural foramen. Conus medullaris and cauda equina: Conus extends to the L1-2 level. New curvilinear enhancement involving multiple cauda equina nerve roots in the mid upper lumbar spine as well as thin enhancement along the surface of the conus. Paraspinal and other soft tissues: Bilateral renal cysts. Disc levels: Mild disc bulging and mild facet hypertrophy from T12-L1 to L3-4 without stenosis. Severe spinal stenosis from L4-S1 due to tumor. IMPRESSION: 1. Progressive, large volume epidural tumor from L4-S1 with severe spinal stenosis. 2. 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 3. New small volume dorsal epidural tumor at L3 with mild spinal stenosis. 4. L1 and L5 pathologic compression fractures with progressive vertebral body height loss. 5. Progressive destructive left-sided sacral tumor with involvement of the left S1 neural foramen. Electronically Signed   By: Logan Bores M.D.   On: 04/10/2019 11:29   Dg Chest Portable 1 View  Result Date: 04/09/2019 CLINICAL DATA:  Loss of sensation to bilateral lower extremities. Reported history of metastatic carcinoma and mass left posterior chest wall by recent MRI of the thoracic spine. EXAM: PORTABLE CHEST 1 VIEW COMPARISON:  MRI of the thoracic spine on 03/27/2019  FINDINGS: Lobulated soft tissue mass projects over the lateral left chest and is likely causing bony destruction a segment of the left sixth rib. Aeration at the left lung base has improved since the prior chest x-ray. No significant pleural fluid identified. No evidence of pneumothorax. IMPRESSION: Large soft tissue mass of the left chest causing visible bony destruction of the left sixth rib.  Electronically Signed   By: Aletta Edouard M.D.   On: 04/09/2019 17:09   Ct Maxillofacial Wo Contrast  Result Date: 04/09/2019 CLINICAL DATA:  Fall EXAM: CT HEAD WITHOUT CONTRAST CT MAXILLOFACIAL WITHOUT CONTRAST TECHNIQUE: Multidetector CT imaging of the head and maxillofacial structures were performed using the standard protocol without intravenous contrast. Multiplanar CT image reconstructions of the maxillofacial structures were also generated. COMPARISON:  None. FINDINGS: CT HEAD FINDINGS Brain: There is no mass, hemorrhage or extra-axial collection. The size and configuration of the ventricles and extra-axial CSF spaces are normal. The brain parenchyma is normal, without evidence of acute or chronic infarction. Vascular: No hyperdense vessel or unexpected vascular calcification. Skull: The visualized skull base, calvarium and extracranial soft tissues are normal. CT MAXILLOFACIAL FINDINGS Osseous: --Complex facial fracture types: No LeFort, zygomaticomaxillary complex or nasoorbitoethmoidal fracture. --Simple fracture types: None. --Mandible, hard palate and teeth: No acute abnormality. Orbits: The globes are intact. Normal appearance of the intra- and extraconal fat. Symmetric extraocular muscles. Sinuses: No fluid levels or advanced mucosal thickening. Soft tissues: Normal visualized extracranial soft tissues. IMPRESSION: 1. No acute intracranial abnormality. 2. No maxillofacial fracture. Electronically Signed   By: Ulyses Jarred M.D.   On: 04/09/2019 20:48    Scheduled Meds:  amLODipine  5 mg Oral Daily   dexamethasone  4 mg Oral Q6H   feeding supplement (PRO-STAT SUGAR FREE 64)  30 mL Oral BID   furosemide  20 mg Oral Daily   pantoprazole  40 mg Oral Daily   venlafaxine XR  75 mg Oral QHS   Continuous Infusions:  sodium chloride     cefTRIAXone (ROCEPHIN)  IV 1 g (04/11/19 0110)     LOS: 1 day    Time spent: 30 minutes.    Barton Dubois, MD [facility] Pager  [phone]   04/11/2019, 2:54 PM

## 2019-04-11 NOTE — Progress Notes (Signed)
Physical Therapy Treatment Patient Details Name: Brandi Rodriguez MRN: [id number] DOB: [birth year] Today's Date: 04/11/2019    History of Present Illness Brandi Rodriguez  is a 58 y.o. female,  58 y.o. female with medical history significant for metastatic cancer who was sent to the [facility] ED from the cancer center 03/27/19 with reports of falls and lower extremity weakness. She reported lower extremity weakness left worse than right that started one day PTA with pain in her back also. One week prior she noticed that her right eyelid drooped.    PT Comments    Assisted nursing for bed mobility with bed pan with min A and cueing for UE reach to hand rail to assist with trunk rotation.  Min-mod A for sitting on EOB and fair seated balance, required Lt UE A on bed to prevent lean.  Seated LE strengthening exercises complete with some AAROM required for ankle dorsiflexion and hip flexion due to weakness.  Heel slides instructed as assistance with scooting toward HOB.  EOS pt limited by mild fatigue, no reports of increased pain.  Pt left in bed with call bell within reach, bed alarm set and NT/RN in room.  Pt did state she owns a sliding board though is currently with granddaughter.  Was unable to find the sliding board in hospital to assist with transfer training.  Will continue to look.    Follow Up Recommendations  SNF;Supervision for mobility/OOB;Supervision/Assistance - 24 hour     Equipment Recommendations  None recommended by PT    Recommendations for Other Services       Precautions / Restrictions Precautions Precautions: Fall    Mobility  Bed Mobility Overal bed mobility: Needs Assistance Bed Mobility: Supine to Sit;Sit to Supine Rolling: Min guard Sidelying to sit: Mod assist Supine to sit: Mod assist Sit to supine: Min assist;Mod assist   General bed mobility comments: limited use of RUE due to chronic arthritis/weakness  Transfers                     Ambulation/Gait                 Stairs             Wheelchair Mobility    Modified Rankin (Stroke Patients Only)       Balance   Sitting-balance support: Feet supported;Single extremity supported(Required A from Lt UE for sitting balance to reduce lean) Sitting balance-Leahy Scale: Fair Sitting balance - Comments: fair/good seated at bedside                                    Cognition Arousal/Alertness: Awake/alert Behavior During Therapy: WFL for tasks assessed/performed Overall Cognitive Status: Within Functional Limits for tasks assessed                                        Exercises General Exercises - Lower Extremity Ankle Circles/Pumps: AAROM;Both;10 reps;Seated Long Arc Quad: Both;10 reps;Seated;AROM;Strengthening Heel Slides: AAROM;Both;5 reps;Supine Hip ABduction/ADduction: Both;10 reps;Seated;Strengthening Hip Flexion/Marching: AAROM;Both;5 reps;Seated    General Comments        Pertinent Vitals/Pain Pain Assessment: 0-10 Pain Score: 6  Pain Descriptors / Indicators: Cramping Pain Intervention(s): Monitored during session;Limited activity within patient's tolerance;Repositioned    Home Living  Prior Function            PT Goals (current goals can now be found in the care plan section)      Frequency    Min 3X/week      PT Plan      Co-evaluation              AM-PAC PT "6 Clicks" Mobility   Outcome Measure  Help needed turning from your back to your side while in a flat bed without using bedrails?: A Lot Help needed moving from lying on your back to sitting on the side of a flat bed without using bedrails?: A Lot Help needed moving to and from a bed to a chair (including a wheelchair)?: A Lot Help needed standing up from a chair using your arms (e.g., wheelchair or bedside chair)?: Total Help needed to walk in hospital room?: Total Help needed  climbing 3-5 steps with a railing? : Total 6 Click Score: 9    End of Session   Activity Tolerance: Patient tolerated treatment well;Patient limited by fatigue Patient left: in bed;with call bell/phone within reach;with bed alarm set;with nursing/sitter in room(RN/NT in room at EOS) Nurse Communication: Mobility status PT Visit Diagnosis: Unsteadiness on feet (R26.81);History of falling (Z91.81);Other abnormalities of gait and mobility (R26.89)     Time: 1308-6578 PT Time Calculation (min) (ACUTE ONLY): 26 min  Charges:  $Therapeutic Exercise: 8-22 mins $Therapeutic Activity: 8-22 mins                     [address] LPTA; CBIS [phone]  Aldona Lento 04/11/2019, 11:35 AM

## 2019-04-11 NOTE — TOC Initial Note (Signed)
Transition of Care [facility]) - Initial/Assessment Note    Patient Details  Name: Brandi Rodriguez MRN: [id number] Date of Birth: January 17, 1961  Transition of Care [facility]) CM/SW Contact:    Ihor Gully, LCSW Phone Number: 04/11/2019, 3:05 PM  Clinical Narrative:                 Patient placed in observation on yesterday. Patient recently discharged last week with [facility] referral made to [facility] for RN,PT.  Patient receives ADL assistance from her husband, daughter in law and daughter. She uses a walker but has a cane, wheelchair and bedside commode in the home.   Patient is agreeable to SNF for short term rehab. Patient provided choices and referrals made.   Expected Discharge Plan: Skilled Nursing Facility Barriers to Discharge: Insurance Authorization   Patient Goals and CMS Choice   CMS Medicare.gov Compare Post Acute Care list provided to:: Patient Choice offered to / list presented to : Patient  Expected Discharge Plan and Services Expected Discharge Plan: [place]       Living arrangements for the past 2 months: Single Family Home                                      Prior Living Arrangements/Services Living arrangements for the past 2 months: Single Family Home Lives with:: Spouse Patient language and need for interpreter reviewed:: Yes Do you feel safe going back to the place where you live?: Yes      Need for Family Participation in Patient Care: Yes (Comment) Care giver support system in place?: Yes (comment) Current home services: DME Criminal Activity/Legal Involvement Pertinent to Current Situation/Hospitalization: No - Comment as needed  Activities of Daily Living Home Assistive Devices/Equipment: Cane (specify quad or straight), Bedside commode/3-in-1, Wheelchair, Environmental consultant (specify type), Raised toilet seat with rails ADL Screening (condition at time of admission) Patient's cognitive ability adequate to safely complete daily  activities?: Yes Is the patient deaf or have difficulty hearing?: No Does the patient have difficulty seeing, even when wearing glasses/contacts?: No Does the patient have difficulty concentrating, remembering, or making decisions?: No Patient able to express need for assistance with ADLs?: Yes Does the patient have difficulty dressing or bathing?: Yes Independently performs ADLs?: No Communication: Needs assistance Is this a change from baseline?: Pre-admission baseline Dressing (OT): Needs assistance Is this a change from baseline?: Pre-admission baseline Grooming: Needs assistance Is this a change from baseline?: Pre-admission baseline Feeding: Independent Bathing: Needs assistance Is this a change from baseline?: Pre-admission baseline Toileting: Needs assistance Is this a change from baseline?: Pre-admission baseline In/Out Bed: Needs assistance Is this a change from baseline?: Pre-admission baseline Walks in Home: Needs assistance Is this a change from baseline?: Pre-admission baseline Does the patient have difficulty walking or climbing stairs?: Yes Weakness of Legs: Both Weakness of Arms/Hands: None  Permission Sought/Granted                  Emotional Assessment Appearance:: Appears stated age   Affect (typically observed): Calm Orientation: : Oriented to Place, Oriented to Self, Oriented to  Time, Oriented to Situation Alcohol / Substance Use: Not Applicable Psych Involvement: No (comment)  Admission diagnosis:  Metastatic cancer ([place]) [C79.9] Weakness [R53.1] Pancytopenia ([place]) [P32.951] Patient Active Problem List   Diagnosis Date Noted  . Acute lower UTI 04/10/2019  . Pancytopenia ([place]) 04/10/2019  . Weakness 04/09/2019  . Follow-up  examination following surgery 04/03/2019  . Cord compression ([place]) 03/28/2019  . Metastatic cancer to spine ([place]) 03/27/2019  . Falls 03/27/2019  . Axillary lymphadenopathy   . Lytic lesion of bone on x-ray 03/12/2019    PCP:  Manon Hilding, MD Pharmacy:   [facility] [address] [place] [address] Phone: [phone] Fax: [phone]     Social Determinants of Health (SDOH) Interventions    Readmission Risk Interventions No flowsheet data found.

## 2019-04-11 NOTE — Progress Notes (Signed)
Report given to [facility] on 5E. All question answered. Pt aware of transfer.  2325- [facility] here for transport to [place]. Report provided to jocelyn.

## 2019-04-11 NOTE — Progress Notes (Signed)
Spoke with pt's son Genoveva Ill. Pt agrees to give son "limited information only" pt continues to pry for information. Genoveva Ill number is [phone].

## 2019-04-11 NOTE — Progress Notes (Signed)
AC attempted to obtain IV on pt for CTA. Unable to obtain 20 gauge IV; pt's veins unable to hold large gauge IV. Will report to provider.

## 2019-04-11 NOTE — Progress Notes (Signed)
Pt now leaving [address] with [facility]. Pt does have medications locked up in pharmacy. AC contacted, unable to retrieve meds. Pt aware and states "My husband can come and pick them up, no worries."

## 2019-04-11 NOTE — Progress Notes (Signed)
OT Cancellation Note  Patient Details Name: Brandi Rodriguez MRN: [id number] DOB: [birth year]   Cancelled Treatment:    Reason Eval/Treat Not Completed: Patient at procedure or test/ unavailable. MD in room with pt this am, will check back at a later time.     Guadelupe Sabin, OTR/L  [phone] 04/11/2019, 8:27 AM

## 2019-04-11 NOTE — Consult Note (Signed)
[facility] Oncology Progress Note  Name: Brandi Rodriguez      MRN: [id number]    Location: P619/J093-26  Date: 04/11/2019 Time:3:32 PM   Subjective: Interval History:Brandi Rodriguez is seen this morning while she is still in the bed.  She apparently sat in the chair yesterday.  She had MRI of the spine done yesterday.  She does report some pain in her both legs.  She has compression devices on her legs.  Denies any nausea vomiting diarrhea.  She did not have a bowel movement for the last couple of days.  Her appetite has been very good.  Objective: Vital signs in last 24 hours: Temp:  [98 F (36.7 C)-98.2 F (36.8 C)] 98.2 F (36.8 C) (06/25 0507) Pulse Rate:  [96-105] 97 (06/25 1435) Resp:  [18] 18 (06/25 1435) BP: (134-171)/(84-93) 171/84 (06/25 1435) SpO2:  [97 %-98 %] 97 % (06/25 1435) Weight:  [214 lb 11.7 oz (97.4 kg)] 214 lb 11.7 oz (97.4 kg) (06/25 0507)    Intake/Output from previous day: 06/24 0800 - 06/25 0759 In: 1280 [P.O.:1080] Out: 2925 [Urine:2925]    Intake/Output this shift: Total I/O In: 480 [P.O.:480] Out: -    PHYSICAL EXAM: BP (!) 171/84 (BP Location: Left Wrist)   Pulse 97   Temp 98.2 F (36.8 C)   Resp 18   Ht 5\' 6"  (1.676 m)   Wt 214 lb 11.7 oz (97.4 kg)   LMP 05/22/2011   SpO2 97%   BMI 34.66 kg/m  Eyes: Right eye drooping present. Neck: no adenopathy, supple, symmetrical, trachea midline and thyroid not enlarged, symmetric, no tenderness/mass/nodules Lungs: clear to auscultation bilaterally Heart: regular rate and rhythm Abdomen: soft, non-tender; bowel sounds normal; no masses,  no organomegaly Extremities: 1+ edema bilaterally. Skin: Skin color, texture, turgor normal. No rashes or lesions Lymph nodes: Cervical, supraclavicular, and axillary nodes normal. Neurologic: Motor: Decreased strength in bilateral lower extremities.   Studies/Results: Results for orders placed or performed during the hospital encounter of  04/09/19 (from the past 48 hour(s))  POC CBG, ED     Status: Abnormal   Collection Time: 04/09/19  4:59 PM  Result Value Ref Range   Glucose-Capillary 103 (H) 70 - 99 mg/dL  Comprehensive metabolic panel     Status: Abnormal   Collection Time: 04/09/19  5:00 PM  Result Value Ref Range   Sodium 137 135 - 145 mmol/L   Potassium 4.0 3.5 - 5.1 mmol/L   Chloride 99 98 - 111 mmol/L   CO2 24 22 - 32 mmol/L   Glucose, Bld 117 (H) 70 - 99 mg/dL   BUN 21 (H) 6 - 20 mg/dL   Creatinine, Ser 0.49 0.44 - 1.00 mg/dL   Calcium 9.4 8.9 - 10.3 mg/dL   Total Protein 6.7 6.5 - 8.1 g/dL   Albumin 2.9 (L) 3.5 - 5.0 g/dL   AST 67 (H) 15 - 41 U/L   ALT 73 (H) 0 - 44 U/L   Alkaline Phosphatase 232 (H) 38 - 126 U/L   Total Bilirubin 1.3 (H) 0.3 - 1.2 mg/dL   GFR calc non Af Amer >60 >60 mL/min   GFR calc Af Amer >60 >60 mL/min   Anion gap 14 5 - 15    Comment: Performed at [facility], [address] [place], [place] [address]  CBC with Differential     Status: Abnormal   Collection Time: 04/09/19  5:00 PM  Result Value Ref Range   WBC  1.3 (LL) 4.0 - 10.5 K/uL    Comment: WHITE COUNT CONFIRMED ON SMEAR THIS CRITICAL RESULT HAS VERIFIED AND BEEN CALLED TO [facility] BY CHASE ISLEY ON 06 23 2020 AT 1850, AND HAS BEEN READ BACK.     RBC 3.84 (L) 3.87 - 5.11 MIL/uL   Hemoglobin 9.8 (L) 12.0 - 15.0 g/dL   HCT 31.9 (L) 36.0 - 46.0 %   MCV 83.1 80.0 - 100.0 fL   MCH 25.5 (L) 26.0 - 34.0 pg   MCHC 30.7 30.0 - 36.0 g/dL   RDW 14.6 11.5 - 15.5 %   Platelets 57 (L) 150 - 400 K/uL    Comment: PLATELET COUNT CONFIRMED BY SMEAR PLATELETS APPEAR DECREASED SPECIMEN CHECKED FOR CLOTS Immature Platelet Fraction may be clinically indicated, consider ordering this additional test [id number]    nRBC 4.0 (H) 0.0 - 0.2 %   Neutrophils Relative % 71 %   Neutro Abs 0.9 (L) 1.7 - 7.7 K/uL   Lymphocytes Relative 11 %   Lymphs Abs 0.1 (L) 0.7 - 4.0 K/uL   Monocytes Relative 9 %   Monocytes Absolute 0.1 0.1 - 1.0  K/uL   Eosinophils Relative 0 %   Eosinophils Absolute 0.0 0.0 - 0.5 K/uL   Basophils Relative 1 %   Basophils Absolute 0.0 0.0 - 0.1 K/uL   Immature Granulocytes 8 %   Abs Immature Granulocytes 0.10 (H) 0.00 - 0.07 K/uL    Comment: Performed at [facility], [address], [place], [place] [address]  Urinalysis, Routine w reflex microscopic     Status: Abnormal   Collection Time: 04/09/19  6:15 PM  Result Value Ref Range   Color, Urine YELLOW YELLOW   APPearance HAZY (A) CLEAR   Specific Gravity, Urine 1.017 1.005 - 1.030   pH 6.0 5.0 - 8.0   Glucose, UA NEGATIVE NEGATIVE mg/dL   Hgb urine dipstick SMALL (A) NEGATIVE   Bilirubin Urine NEGATIVE NEGATIVE   Ketones, ur 5 (A) NEGATIVE mg/dL   Protein, ur NEGATIVE NEGATIVE mg/dL   Nitrite NEGATIVE NEGATIVE   Leukocytes,Ua TRACE (A) NEGATIVE   RBC / HPF 0-5 0 - 5 RBC/hpf   WBC, UA 11-20 0 - 5 WBC/hpf   Bacteria, UA FEW (A) NONE SEEN   Squamous Epithelial / LPF 0-5 0 - 5   Mucus PRESENT     Comment: Performed at [facility], [address], [place], [place] [address]  Urine Culture     Status: Abnormal (Preliminary result)   Collection Time: 04/09/19  6:15 PM   Specimen: Urine, Catheterized  Result Value Ref Range   Specimen Description      URINE, CATHETERIZED Performed at [facility], [address] [place], [place] [address]    Special Requests      Normal Performed at [facility], [address], [place], [place] [address]    Culture (A)     80,000 COLONIES/mL ESCHERICHIA COLI SUSCEPTIBILITIES TO FOLLOW Performed at [facility], [address] [place], [place] [address]    Report Status PENDING   Protime-INR     Status: None   Collection Time: 04/09/19 11:54 PM  Result Value Ref Range   Prothrombin Time 14.3 11.4 - 15.2 seconds   INR 1.1 0.8 - 1.2    Comment: (NOTE) INR goal varies based on device and disease states. Performed at [facility], [address], [place],  [address]   APTT     Status:  None   Collection  Time: 04/09/19 11:54 PM  Result Value Ref Range   aPTT 31 24 - 36 seconds    Comment: Performed at [facility], [address], [place], [place] [address]  D-dimer, quantitative (not at [facility])     Status: Abnormal   Collection Time: 04/09/19 11:54 PM  Result Value Ref Range   D-Dimer, Quant 8.42 (H) 0.00 - 0.50 ug/mL-FEU    Comment: (NOTE) At the manufacturer cut-off of 0.50 ug/mL FEU, this assay has been documented to exclude PE with a sensitivity and negative predictive value of 97 to 99%.  At this time, this assay has not been approved by the FDA to exclude DVT/VTE. Results should be correlated with clinical presentation. Performed at [facility], [address] [place], [place] [address]   Comprehensive metabolic panel     Status: Abnormal   Collection Time: 04/10/19  5:08 AM  Result Value Ref Range   Sodium 140 135 - 145 mmol/L   Potassium 3.9 3.5 - 5.1 mmol/L   Chloride 104 98 - 111 mmol/L   CO2 24 22 - 32 mmol/L   Glucose, Bld 129 (H) 70 - 99 mg/dL   BUN 18 6 - 20 mg/dL   Creatinine, Ser 0.42 (L) 0.44 - 1.00 mg/dL   Calcium 9.1 8.9 - 10.3 mg/dL   Total Protein 6.1 (L) 6.5 - 8.1 g/dL   Albumin 2.5 (L) 3.5 - 5.0 g/dL   AST 62 (H) 15 - 41 U/L   ALT 67 (H) 0 - 44 U/L   Alkaline Phosphatase 236 (H) 38 - 126 U/L   Total Bilirubin 0.7 0.3 - 1.2 mg/dL   GFR calc non Af Amer >60 >60 mL/min   GFR calc Af Amer >60 >60 mL/min   Anion gap 12 5 - 15    Comment: Performed at [facility], [address] [place], [place] [address]  CBC     Status: Abnormal   Collection Time: 04/10/19  5:08 AM  Result Value Ref Range   WBC 1.3 (LL) 4.0 - 10.5 K/uL    Comment: REPEATED TO VERIFY WHITE COUNT CONFIRMED ON SMEAR CRITICAL VALUE NOTED.  VALUE IS CONSISTENT WITH PREVIOUSLY REPORTED AND CALLED VALUE. THIS CRITICAL RESULT HAS VERIFIED AND BEEN CALLED TO TATE,R BY SHERRI HUFFINES ON 06 24 2020 AT 0652, AND HAS BEEN READ BACK.     RBC 3.47 (L) 3.87 - 5.11 MIL/uL    Hemoglobin 8.8 (L) 12.0 - 15.0 g/dL   HCT 28.6 (L) 36.0 - 46.0 %   MCV 82.4 80.0 - 100.0 fL   MCH 25.4 (L) 26.0 - 34.0 pg   MCHC 30.8 30.0 - 36.0 g/dL   RDW 14.8 11.5 - 15.5 %   Platelets 43 (L) 150 - 400 K/uL    Comment: REPEATED TO VERIFY PLATELET COUNT CONFIRMED BY SMEAR SPECIMEN CHECKED FOR CLOTS Immature Platelet Fraction may be clinically indicated, consider ordering this additional test [id number]    nRBC 3.9 (H) 0.0 - 0.2 %    Comment: Performed at [facility], [address], [place], [place] [address]   Ct Head Wo Contrast  Result Date: 04/09/2019 CLINICAL DATA:  Fall EXAM: CT HEAD WITHOUT CONTRAST CT MAXILLOFACIAL WITHOUT CONTRAST TECHNIQUE: Multidetector CT imaging of the head and maxillofacial structures were performed using the standard protocol without intravenous contrast. Multiplanar CT image reconstructions of the maxillofacial structures were also generated. COMPARISON:  None. FINDINGS: CT HEAD FINDINGS Brain: There is no mass, hemorrhage or extra-axial collection. The size and configuration  of the ventricles and extra-axial CSF spaces are normal. The brain parenchyma is normal, without evidence of acute or chronic infarction. Vascular: No hyperdense vessel or unexpected vascular calcification. Skull: The visualized skull base, calvarium and extracranial soft tissues are normal. CT MAXILLOFACIAL FINDINGS Osseous: --Complex facial fracture types: No LeFort, zygomaticomaxillary complex or nasoorbitoethmoidal fracture. --Simple fracture types: None. --Mandible, hard palate and teeth: No acute abnormality. Orbits: The globes are intact. Normal appearance of the intra- and extraconal fat. Symmetric extraocular muscles. Sinuses: No fluid levels or advanced mucosal thickening. Soft tissues: Normal visualized extracranial soft tissues. IMPRESSION: 1. No acute intracranial abnormality. 2. No maxillofacial fracture. Electronically Signed   By: Ulyses Jarred M.D.   On: 04/09/2019 20:48    Mr Jeri Cos FO Contrast  Result Date: 04/10/2019 CLINICAL DATA:  Bilateral leg weakness. Altered mental status. Metastatic cancer. EXAM: MRI HEAD WITHOUT AND WITH CONTRAST TECHNIQUE: Multiplanar, multiecho pulse sequences of the brain and surrounding structures were obtained without and with intravenous contrast. CONTRAST:  7.5 mL Gadavist COMPARISON:  Head CT 04/09/2019 FINDINGS: The study is mildly motion degraded. Brain: There is no evidence of acute infarct, intracranial hemorrhage, midline shift, or extra-axial fluid collection. The ventricles and sulci are within normal limits for age. No significant cerebral white matter disease is evident. An enhancing extra-axial mass in the left middle cranial fossa involving the sphenoid wing measures 20 x 11 x 19 mm. There is minimal associated mass effect on the left temporal tip without brain edema. The pituitary gland is enlarged with evidence of an approximately 2 cm hypoenhancing sellar mass extending into the posterior aspect of the right cavernous sinus and invading the superior aspect of the clivus on the right. Vascular: Major intracranial vascular flow voids are preserved. Skull and upper cervical spine: Superior clival involvement by the sellar mass. No destructive skull lesion seen elsewhere. Sinuses/Orbits: Unremarkable orbits. Mild mucosal thickening in the sphenoid sinuses. Clear mastoid air cells. Other: None. IMPRESSION: 1. No infarct. 2. 2 cm extra-axial mass in the left middle cranial fossa which may represent a meningioma or metastasis. 3. 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 Electronically Signed   By: Logan Bores M.D.   On: 04/10/2019 10:27   Mr Thoracic Spine W Wo Contrast  Result Date: 04/10/2019 CLINICAL DATA:  Bilateral leg weakness. Altered mental status. Metastatic cancer. EXAM: MRI THORACIC WITHOUT AND WITH CONTRAST TECHNIQUE: Multiplanar and multiecho pulse sequences of the thoracic  spine were obtained without and with intravenous contrast. CONTRAST:  7.5 mL Gadavist COMPARISON:  03/27/2019 FINDINGS: Alignment: Slight right convex curvature of the thoracic spine. No significant listhesis. Vertebrae: Widespread marrow replacement and heterogeneous enhancement throughout the visualized vertebral bodies and posterior elements consistent with known metastases. Pathologic T9 compression fracture with 40% vertebral body height loss, progressed from the prior study. Destructive lesion involving the right-sided posterior elements at T3 and adjacent third rib with epidural extension into the right lateral aspect of the spinal canal and right T3 neural foramen, stable to slightly progressed without significant spinal stenosis. Small volume residual ventral epidural tumor at T9, decreased from prior and resulting in borderline spinal stenosis without cord compression. Tumor involving the left-sided vertebral body and posterior elements at L1, left T12 and L1 neural foramina, and left lateral epidural space in the canal has decreased in size, now measuring 3.8 cm in maximal dimension (previously 4.9 cm), and the volume of epidural tumor in the canal has greatly decreased without residual spinal  stenosis. Metastases involve the sternum with a minimally displaced oblique fracture of the sternal body noted. Cord:  Normal signal and morphology. Paraspinal and other soft tissues: Incomplete imaging of known large posterior left lung and chest wall mass. Extensive left-sided pleural tumor with trace left pleural effusion. 1.3 cm left upper pole renal cyst. Disc levels: Mild multilevel disc bulging and facet arthrosis. No significant spinal stenosis. Moderate right neural foraminal stenosis at T2-3 due to disc and osteophyte. IMPRESSION: 1. Known widespread osseous metastases and left chest wall/posterior lung mass. 2. Decreased epidural tumor at T9 and T12-L1 without associated spinal stenosis. 3. Pathologic T9  compression fracture with progressive vertebral body height loss. 4. Stable to slightly progressive destructive right-sided lesion at T3 with neural foraminal involvement and epidural extension in the right lateral spinal canal. No associated spinal stenosis. 5. Minimally displaced pathologic sternal body fracture. Electronically Signed   By: Logan Bores M.D.   On: 04/10/2019 10:57   Mr Lumbar Spine W Wo Contrast  Result Date: 04/10/2019 CLINICAL DATA:  Bilateral leg weakness.  Metastatic cancer. EXAM: MRI LUMBAR SPINE WITHOUT AND WITH CONTRAST TECHNIQUE: Multiplanar and multiecho pulse sequences of the lumbar spine were obtained without and with intravenous contrast. CONTRAST:  7.5 mL Gadavist COMPARISON:  03/01/2019 FINDINGS: Segmentation:  Standard. Alignment:  Normal. Vertebrae: Widespread marrow replacement and heterogeneous enhancement throughout the spine and visualized sacrum/pelvis consistent with known metastases. Pathologic compression fractures at L1 and L5 with progressive vertebral body height loss now measuring 25% and 55%, respectively. Diminished left-sided epidural tumor at T12-L1 as described on separate thoracic spine MRI. New 1.8 cm focus of dorsal epidural tumor at L3 resulting in mild spinal stenosis. Marked progression of now large volume epidural tumor centered at L5 and extending superiorly to L4 and inferiorly to S1 with severe spinal stenosis and complete effacement of the thecal sac. Extraosseous tumor extension anterior to L5 as well. Progressive destructive sacral tumor with extraosseous tumor extension about the left sacral ala with involvement of the left S1 neural foramen. Conus medullaris and cauda equina: Conus extends to the L1-2 level. New curvilinear enhancement involving multiple cauda equina nerve roots in the mid upper lumbar spine as well as thin enhancement along the surface of the conus. Paraspinal and other soft tissues: Bilateral renal cysts. Disc levels: Mild disc  bulging and mild facet hypertrophy from T12-L1 to L3-4 without stenosis. Severe spinal stenosis from L4-S1 due to tumor. IMPRESSION: 1. Progressive, large volume epidural tumor from L4-S1 with severe spinal stenosis. 2. 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 3. New small volume dorsal epidural tumor at L3 with mild spinal stenosis. 4. L1 and L5 pathologic compression fractures with progressive vertebral body height loss. 5. Progressive destructive left-sided sacral tumor with involvement of the left S1 neural foramen. Electronically Signed   By: Logan Bores M.D.   On: 04/10/2019 11:29   Dg Chest Portable 1 View  Result Date: 04/09/2019 CLINICAL DATA:  Loss of sensation to bilateral lower extremities. Reported history of metastatic carcinoma and mass left posterior chest wall by recent MRI of the thoracic spine. EXAM: PORTABLE CHEST 1 VIEW COMPARISON:  MRI of the thoracic spine on 03/27/2019 FINDINGS: Lobulated soft tissue mass projects over the lateral left chest and is likely causing bony destruction a segment of the left sixth rib. Aeration at the left lung base has improved since the prior chest x-ray. No significant pleural fluid identified. No evidence  of pneumothorax. IMPRESSION: Large soft tissue mass of the left chest causing visible bony destruction of the left sixth rib. Electronically Signed   By: Aletta Edouard M.D.   On: 04/09/2019 17:09   Ct Maxillofacial Wo Contrast  Result Date: 04/09/2019 CLINICAL DATA:  Fall EXAM: CT HEAD WITHOUT CONTRAST CT MAXILLOFACIAL WITHOUT CONTRAST TECHNIQUE: Multidetector CT imaging of the head and maxillofacial structures were performed using the standard protocol without intravenous contrast. Multiplanar CT image reconstructions of the maxillofacial structures were also generated. COMPARISON:  None. FINDINGS: CT HEAD FINDINGS Brain: There is no mass, hemorrhage or extra-axial  collection. The size and configuration of the ventricles and extra-axial CSF spaces are normal. The brain parenchyma is normal, without evidence of acute or chronic infarction. Vascular: No hyperdense vessel or unexpected vascular calcification. Skull: The visualized skull base, calvarium and extracranial soft tissues are normal. CT MAXILLOFACIAL FINDINGS Osseous: --Complex facial fracture types: No LeFort, zygomaticomaxillary complex or nasoorbitoethmoidal fracture. --Simple fracture types: None. --Mandible, hard palate and teeth: No acute abnormality. Orbits: The globes are intact. Normal appearance of the intra- and extraconal fat. Symmetric extraocular muscles. Sinuses: No fluid levels or advanced mucosal thickening. Soft tissues: Normal visualized extracranial soft tissues. IMPRESSION: 1. No acute intracranial abnormality. 2. No maxillofacial fracture. Electronically Signed   By: Ulyses Jarred M.D.   On: 04/09/2019 20:48     MEDICATIONS: I have reviewed the patient's current medications.     Assessment/Plan:  1. High-grade neuroendocrine carcinoma: - She has received 8/10 treatments of radiation to the T9-L1 level and lung primary until 04/08/2019. - She presented to the hospital with weakness in her legs. - MRI of the thoracic spine on 04/10/2019 showed decreased epidural tumor at T9 and T12-L1 without spinal stenosis. -MRI of the lumbar spine showed progressive large epidural tumor from L4 S1 and severe spinal stenosis.  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  New small volume dorsal epidural tumor at L3 with mild spinal stenosis. - MRI of the brain with and without contrast showed enhancing extra-axial mass in the left middle cranial fossa involving the sphenoid wing and measuring 20 x 11 x 19 mm.  Minimal associated mass-effect.  Pituitary gland is enlarged with evidence of 2 cm hypoenhancing sellar mass extending  into the posterior aspect of the right cavernous sinus and invading the superior aspect of the clivus on the right. - I have discussed the case with Dr. Tammi Klippel.  She will be transferred to [facility] long for radiation treatments to the lumbar spine area as well as to the brain. - I will contact Dr. Melina Copa about further clarification on the pathology report which shows high-grade malignant neoplasm with neuroendocrine features.  Differential includes Ewing sarcoma/primitive neural ectodermal tumor and neuroendocrine carcinoma.  FISH studies were reportedly sent.  All questions were answered. The patient knows to call the clinic with any problems, questions or concerns. We can certainly see the patient much sooner if necessary.     Derek Jack

## 2019-04-11 NOTE — Progress Notes (Signed)
Spoke with pt's granddaughter. Pt agrees to give her  "limited information only".

## 2019-04-12 ENCOUNTER — Ambulatory Visit: Payer: BC Managed Care – PPO

## 2019-04-12 ENCOUNTER — Ambulatory Visit
Admit: 2019-04-12 | Discharge: 2019-04-12 | Disposition: A | Discharge status: Home / Self Care | Payer: BC Managed Care – PPO | Attending: Radiation Oncology | Admitting: Radiation Oncology

## 2019-04-12 DIAGNOSIS — Z978 Presence of other specified devices: Secondary | ICD-10-CM

## 2019-04-12 DIAGNOSIS — C7951 Secondary malignant neoplasm of bone: Secondary | ICD-10-CM

## 2019-04-12 DIAGNOSIS — Z960 Presence of urogenital implants: Secondary | ICD-10-CM

## 2019-04-12 DIAGNOSIS — R627 Adult failure to thrive: Secondary | ICD-10-CM

## 2019-04-12 DIAGNOSIS — C7931 Secondary malignant neoplasm of brain: Secondary | ICD-10-CM

## 2019-04-12 LAB — URINE CULTURE
Culture: 80000 — AB
Special Requests: NORMAL

## 2019-04-12 MED ORDER — ACETAMINOPHEN 325 MG PO TABS
650.0000 mg | ORAL_TABLET | Freq: Four times a day (QID) | ORAL | Status: DC | PRN
  Administered 2019-04-18 – 2019-04-26 (×4): 650 mg via ORAL
  Filled 2019-04-12 (×4): qty 2

## 2019-04-12 MED ORDER — HYDROCORTISONE (PERIANAL) 2.5 % EX CREA
TOPICAL_CREAM | Freq: Two times a day (BID) | CUTANEOUS | Status: DC
  Administered 2019-04-12 – 2019-04-25 (×21): via RECTAL
  Filled 2019-04-12: qty 28.35

## 2019-04-12 MED ORDER — SODIUM CHLORIDE 0.9 % IV SOLN
1.0000 g | Freq: Three times a day (TID) | INTRAVENOUS | Status: AC
  Administered 2019-04-12 – 2019-04-18 (×20): 1 g via INTRAVENOUS
  Filled 2019-04-12 (×20): qty 1

## 2019-04-12 MED ORDER — SENNOSIDES-DOCUSATE SODIUM 8.6-50 MG PO TABS
1.0000 | ORAL_TABLET | Freq: Two times a day (BID) | ORAL | Status: DC
  Administered 2019-04-12 – 2019-04-26 (×27): 1 via ORAL
  Filled 2019-04-12 (×28): qty 1

## 2019-04-12 MED ORDER — FENTANYL CITRATE (PF) 100 MCG/2ML IJ SOLN
25.0000 ug | Freq: Once | INTRAMUSCULAR | Status: AC
  Administered 2019-04-12: 25 ug via INTRAVENOUS
  Filled 2019-04-12: qty 2

## 2019-04-12 NOTE — Progress Notes (Signed)
Radiation Oncology         [phone] ________________________________  Name: Brandi Rodriguez        MRN: [id number]  Date of Service: 04/12/2019 DOB: 1960/11/14  TG:GYIRSW, Silvestre Moment, MD  No ref. provider found     REFERRING PHYSICIAN: Dr. 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  HISTORY OF PRESENT ILLNESS: Brandi Rodriguez is a 58 y.o. female initially seen in consult during a previous hospital admission on 03/28/19 at the request of Dr. 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 In summary, the patient had been experiencing terrible low back pain for more than a few weeks. She ultimately presented to orthopedics and underwent an outside scan and lumbar MRI on 03/01/2019 revealing innumerable bony lesions in the lumbosacral spine with anterior epidural tumor at L5 with impingement of bilateral L5 nerve roots.  Left-sided epidural and paravertebral tumor at L1 also involved T12-L1 and L1-L2 neuroforamina.  She was seen and evaluated with medical oncology at [facility].  On physical exam during the assessment, she was noted to have had a 1 to 2 cm enlarged left axillary lymph node, and was consulted to be evaluated by neurosurgery.  She was seen by Dr. Arnoldo Morale, and on 03/25/2019 a biopsy of the left axillary node was performed and was consistent with a poorly differentiated neuroendocrine carcinoma.  She increasingly started having left low back and lower extremity pain, with weakness.  She also reported falling at home on multiple occasions prior to her admission. On exam, her left leg was severely weak with a strength of 1 out of 5 so she was sent to the emergency room where an MRI of the thoracic spine, CT head without contrast and chest x-ray were all performed.  Her MRI of the thoracic  spine revealed a mass at T9, 9 x 10 cm extending posteriorly out of the chest into the soft tissues of the back, ribs and paraspinous muscles.  The tumor involves the ventral and lateral epidural spaces effacing CSF about the cord, and a large tumor deposit was also noted from T12-L1 foramen.  The tumor falls left into the paraspinous muscles and extends to the left foramen from T12-L1 and L1-L2 measuring approximately 5 x 4 cm.  The tumor deflects the cord to the right.  CT head did not reveal any acute intracranial abnormality.  Chest x-ray revealed a pleural effusion and diffuse opacity within the left thorax, borderline cardiomegaly and poorly visible left sixth rib.  Given the findings she was evaluated by neurosurgery but was not felt that surgical intervention would be of benefit, and thus, radiation oncology was asked to see the patient to consider palliative radiotherapy to these sites.  She was transferred to [facility] on 03/28/19 and began a 10 fx course of daily radiation treatments to T9, T12-L1 and left chest wall.  She completed 8/10 radiation treatments and was on a tapering dose of steroids when she began developing progressive weakness in the LEs, prompting further evaluation through the ED at [facility] on 04/10/19.  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 Additionally, there was new small volume dorsal epidural tumor at L3 with mild spinal stenosis and pathologic compression fractures at L1 and L5 with progressive vertebral body height loss and progressive destructive left-sided sacral tumor with involvement of the left S1  neural foramen.     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  She was evaluated by Dr. Delton Coombes who has communicated with Dr. Tammi Klippel and reviewed her recent scans.  Her dexamethasone dose was increased back to 4mg  po every 6 hours and she has been transferred back to [facility] for consideration of palliative radiotherapy to the new sites of epidural disease in the lumbar spine and skull base in addition to completing her previous course of treatment to T9, L1 and the left chest wall.  PREVIOUS RADIATION THERAPY: Yes:   03/28/19 - 04/08/19:  8/10 planned palliative radiation treatments to T9, L1 and left chest wall- interrupted due to recent hospital admission  PAST MEDICAL HISTORY:  Past Medical History:  Diagnosis Date   Arthritis        PAST SURGICAL HISTORY: Past Surgical History:  Procedure Laterality Date   AXILLARY LYMPH NODE BIOPSY Left 03/25/2019   Procedure: LEFT AXILLARY LYMPH NODE BIOPSY;  Surgeon: Aviva Signs, MD;  Location: [facility];  Service: General;  Laterality: Left;   BACK SURGERY     BREAST SURGERY       FAMILY HISTORY:  Family History  Problem Relation Age of Onset   Arthritis Mother    Heart disease Father    Heart disease Sister    Arthritis Brother    Heart disease Sister      SOCIAL HISTORY:  reports that she quit smoking about 10 years ago. Her smoking use included cigarettes. She has a 15.00 pack-year smoking history. She has never used smokeless tobacco. She reports previous alcohol use. She reports that she does not use drugs. The patient is married and resides in [place].  Apparently her husband has stage IV cancer as well.  ALLERGIES: Hydrocodone  MEDICATIONS:  Current Facility-Administered Medications  Medication Dose Route Frequency Provider Last Rate Last Dose   0.9 %  sodium  chloride infusion  1,000 mL Intravenous Continuous Barton Dubois, MD 125 mL/hr at 04/12/19 0037 1,000 mL at 04/12/19 0037   acetaminophen (TYLENOL) tablet 650 mg  650 mg Oral Q6H PRN Elder Cyphers, NP       ALPRAZolam Duanne Moron) tablet 0.5 mg  0.5 mg Oral BID PRN Barton Dubois, MD   0.5 mg at 04/12/19 0110   amLODipine (NORVASC) tablet 5 mg  5 mg Oral Daily Barton Dubois, MD   5 mg at 04/11/19 0915   cefTRIAXone (ROCEPHIN) 1 g in sodium chloride 0.9 % 100 mL IVPB  1 g Intravenous Q24H Barton Dubois, MD 200 mL/hr at 04/12/19 0052 1 g at 04/12/19 0052   dexamethasone (DECADRON) tablet 4 mg  4 mg Oral Q6H Barton Dubois, MD   4 mg at 04/12/19 0541   feeding supplement (PRO-STAT SUGAR FREE 64) liquid 30 mL  30 mL Oral BID Barton Dubois, MD   30 mL at 04/11/19 2107   furosemide (LASIX) tablet 20 mg  20 mg Oral Daily Barton Dubois, MD   20 mg at 04/11/19 [phone]  ondansetron (ZOFRAN) tablet 4 mg  4 mg Oral Q6H PRN Barton Dubois, MD       pantoprazole (PROTONIX) EC tablet 40 mg  40 mg Oral Daily Barton Dubois, MD   40 mg at 04/11/19 0915   polyethylene glycol (MIRALAX / GLYCOLAX) packet 17 g  17 g Oral Daily PRN Barton Dubois, MD   17 g at 04/10/19 1850   traMADol (ULTRAM) tablet 50 mg  50 mg Oral Q6H PRN Barton Dubois, MD   50 mg at 04/11/19 1927   venlafaxine XR (EFFEXOR-XR) 24 hr capsule 75 mg  75 mg Oral QHS Barton Dubois, MD   75 mg at 04/11/19 2107     REVIEW OF SYSTEMS: On review of systems, the patient reports that she is doing well overall.  She reports her pain is better controlled since starting radiation as well as resuming higher dose steroids on recent admission.  She is unable to really assess how her weakness is today but continues with bilateral LE swelling which is uncomfortable.  She reports that she has continued with urinary retention, and has a Foley catheter placed.  She denies headaches, dizziness, tinnitus, confusion or difficulty with speech. She denies any  chest pain, shortness of breath, cough, fevers, chills, night sweats, unintended weight changes. She denies any bowel disturbances and denies abdominal pain, nausea or vomiting. She denies any other  musculoskeletal or joint aches or pains. A complete review of systems is obtained and is otherwise negative.  PHYSICAL EXAM:  Wt Readings from Last 3 Encounters:  04/11/19 214 lb 11.7 oz (97.4 kg)  03/28/19 220 lb 14.4 oz (100.2 kg)  03/27/19 222 lb (100.7 kg)   Temp Readings from Last 3 Encounters:  04/12/19 97.7 F (36.5 C) (Oral)  03/29/19 98.6 F (37 C) (Oral)  03/27/19 99 F (37.2 C) (Oral)   BP Readings from Last 3 Encounters:  04/12/19 (!) 164/97  03/29/19 (!) 180/81  03/27/19 132/64   Pulse Readings from Last 3 Encounters:  04/12/19 94  03/29/19 97  03/27/19 (!) 110   Pain Assessment Pain Score: 7 /10 In general this is a well appearing caucasian female in no acute distress. She's alert and oriented x4 and appropriate throughout the examination. Persistent right eye ptosis. Cardiopulmonary assessment is negative for acute distress and she exhibits normal effort. 1+ pitting edema in bilateral LEs, no deep calf tenderness or cyanosis. Strength is 2/5 in the LLE as compared to 4/5 in the RLE with decreased sensation to light touch in the medial aspect of the LLE. Foley catheter in place and draining appropriately to gravity.  ECOG = 3  0 - Asymptomatic (Fully active, able to carry on all predisease activities without restriction)  1 - Symptomatic but completely ambulatory (Restricted in physically strenuous activity but ambulatory and able to carry out work of a light or sedentary nature. For example, light housework, office work)  2 - Symptomatic, <50% in bed during the day (Ambulatory and capable of all self care but unable to carry out any work activities. Up and about more than 50% of waking hours)  3 - Symptomatic, >50% in bed, but not bedbound (Capable of only limited  self-care, confined to bed or chair 50% or more of waking hours)  4 - Bedbound (Completely disabled. Cannot carry on any self-care. Totally confined to bed or chair)  5 - Death   Eustace Pen MM, Creech RH, Tormey DC, et al. [phone]). "Toxicity and response criteria of the [facility]  Group". [place] Oncol. 5 (6): 649-55    LABORATORY DATA:  Lab Results  Component Value Date   WBC 1.3 (LL) 04/10/2019   HGB 8.8 (L) 04/10/2019   HCT 28.6 (L) 04/10/2019   MCV 82.4 04/10/2019   PLT 43 (L) 04/10/2019   Lab Results  Component Value Date   NA 140 04/10/2019   K 3.9 04/10/2019   CL 104 04/10/2019   CO2 24 04/10/2019   Lab Results  Component Value Date   ALT 67 (H) 04/10/2019   AST 62 (H) 04/10/2019   ALKPHOS 236 (H) 04/10/2019   BILITOT 0.7 04/10/2019      RADIOGRAPHY: Ct Head Wo Contrast  Result Date: 04/09/2019 CLINICAL DATA:  Fall EXAM: CT HEAD WITHOUT CONTRAST CT MAXILLOFACIAL WITHOUT CONTRAST TECHNIQUE: Multidetector CT imaging of the head and maxillofacial structures were performed using the standard protocol without intravenous contrast. Multiplanar CT image reconstructions of the maxillofacial structures were also generated. COMPARISON:  None. FINDINGS: CT HEAD FINDINGS Brain: There is no mass, hemorrhage or extra-axial collection. The size and configuration of the ventricles and extra-axial CSF spaces are normal. The brain parenchyma is normal, without evidence of acute or chronic infarction. Vascular: No hyperdense vessel or unexpected vascular calcification. Skull: The visualized skull base, calvarium and extracranial soft tissues are normal. CT MAXILLOFACIAL FINDINGS Osseous: --Complex facial fracture types: No Le[place], zygomaticomaxillary complex or nasoorbitoethmoidal fracture. --Simple fracture types: None. --Mandible, hard palate and teeth: No acute abnormality. Orbits: The globes are intact. Normal appearance of the intra- and extraconal fat. Symmetric  extraocular muscles. Sinuses: No fluid levels or advanced mucosal thickening. Soft tissues: Normal visualized extracranial soft tissues. IMPRESSION: 1. No acute intracranial abnormality. 2. No maxillofacial fracture. Electronically Signed   By: Ulyses Jarred M.D.   On: 04/09/2019 20:48   Ct Head Wo Contrast  Result Date: 03/27/2019 CLINICAL DATA:  Initial evaluation for right-sided ptosis. EXAM: CT HEAD WITHOUT CONTRAST TECHNIQUE: Contiguous axial images were obtained from the base of the skull through the vertex without intravenous contrast. COMPARISON:  None. FINDINGS: Brain: Age-related cerebral atrophy with mild chronic small vessel ischemic disease. No acute intracranial hemorrhage. No acute large vessel territory infarct. No mass lesion, midline shift or mass effect. No hydrocephalus. No extra-axial fluid collection. Vascular: No hyperdense vessel. Scattered vascular calcifications noted within the carotid siphons. Skull: Scalp soft tissues and calvarium within normal limits. Sinuses/Orbits: Globes normal soft tissues demonstrate no acute finding. Layering fluid noted within left sphenoid sinus. Paranasal sinuses are otherwise clear. No mastoid effusion. Other: None. IMPRESSION: 1. No acute intracranial abnormality. 2. Mild age-related cerebral atrophy with chronic microvascular ischemic disease. 3. Acute left sphenoid sinusitis. Electronically Signed   By: Jeannine Boga M.D.   On: 03/27/2019 21:40   Mr Jeri Cos ZT Contrast  Result Date: 04/10/2019 CLINICAL DATA:  Bilateral leg weakness. Altered mental status. Metastatic cancer. EXAM: MRI HEAD WITHOUT AND WITH CONTRAST TECHNIQUE: Multiplanar, multiecho pulse sequences of the brain and surrounding structures were obtained without and with intravenous contrast. CONTRAST:  7.5 mL Gadavist COMPARISON:  Head CT 04/09/2019 FINDINGS: The study is mildly motion degraded. Brain: There is no evidence of acute infarct, intracranial hemorrhage, midline  shift, or extra-axial fluid collection. The ventricles and sulci are within normal limits for age. No significant cerebral white matter disease is evident. An enhancing extra-axial mass in the left middle cranial fossa involving the sphenoid wing measures 20 x 11 x 19 mm. There is minimal associated mass effect on the left  temporal tip without brain edema. The pituitary gland is enlarged with evidence of an approximately 2 cm hypoenhancing sellar mass extending into the posterior aspect of the right cavernous sinus and invading the superior aspect of the clivus on the right. Vascular: Major intracranial vascular flow voids are preserved. Skull and upper cervical spine: Superior clival involvement by the sellar mass. No destructive skull lesion seen elsewhere. Sinuses/Orbits: Unremarkable orbits. Mild mucosal thickening in the sphenoid sinuses. Clear mastoid air cells. Other: None. IMPRESSION: 1. No infarct. 2. 2 cm extra-axial mass in the left middle cranial fossa which may represent a meningioma or metastasis. 3. 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 Electronically Signed   By: Logan Bores M.D.   On: 04/10/2019 10:27   Mr Thoracic Spine W Wo Contrast  Result Date: 04/10/2019 CLINICAL DATA:  Bilateral leg weakness. Altered mental status. Metastatic cancer. EXAM: MRI THORACIC WITHOUT AND WITH CONTRAST TECHNIQUE: Multiplanar and multiecho pulse sequences of the thoracic spine were obtained without and with intravenous contrast. CONTRAST:  7.5 mL Gadavist COMPARISON:  03/27/2019 FINDINGS: Alignment: Slight right convex curvature of the thoracic spine. No significant listhesis. Vertebrae: Widespread marrow replacement and heterogeneous enhancement throughout the visualized vertebral bodies and posterior elements consistent with known metastases. Pathologic T9 compression fracture with 40% vertebral body height loss, progressed from the prior study. Destructive lesion  involving the right-sided posterior elements at T3 and adjacent third rib with epidural extension into the right lateral aspect of the spinal canal and right T3 neural foramen, stable to slightly progressed without significant spinal stenosis. Small volume residual ventral epidural tumor at T9, decreased from prior and resulting in borderline spinal stenosis without cord compression. Tumor involving the left-sided vertebral body and posterior elements at L1, left T12 and L1 neural foramina, and left lateral epidural space in the canal has decreased in size, now measuring 3.8 cm in maximal dimension (previously 4.9 cm), and the volume of epidural tumor in the canal has greatly decreased without residual spinal stenosis. Metastases involve the sternum with a minimally displaced oblique fracture of the sternal body noted. Cord:  Normal signal and morphology. Paraspinal and other soft tissues: Incomplete imaging of known large posterior left lung and chest wall mass. Extensive left-sided pleural tumor with trace left pleural effusion. 1.3 cm left upper pole renal cyst. Disc levels: Mild multilevel disc bulging and facet arthrosis. No significant spinal stenosis. Moderate right neural foraminal stenosis at T2-3 due to disc and osteophyte. IMPRESSION: 1. Known widespread osseous metastases and left chest wall/posterior lung mass. 2. Decreased epidural tumor at T9 and T12-L1 without associated spinal stenosis. 3. Pathologic T9 compression fracture with progressive vertebral body height loss. 4. Stable to slightly progressive destructive right-sided lesion at T3 with neural foraminal involvement and epidural extension in the right lateral spinal canal. No associated spinal stenosis. 5. Minimally displaced pathologic sternal body fracture. Electronically Signed   By: Logan Bores M.D.   On: 04/10/2019 10:57   Mr Thoracic Spine W Wo Contrast  Result Date: 03/27/2019 CLINICAL DATA:  Thoracic spine pain for approximately 2  weeks. History of multiple falls. EXAM: MRI THORACIC WITHOUT AND WITH CONTRAST TECHNIQUE: Multiplanar and multiecho pulse sequences of the thoracic spine were obtained without and with intravenous contrast. CONTRAST:  10 cc Gadavist IV. COMPARISON:  None. FINDINGS: MRI THORACIC SPINE FINDINGS Alignment:  Maintained. Vertebrae: There is abnormal marrow signal and enhancement in all imaged vertebral bodies consistent with extensive metastatic disease. Tumor extends into  the posterior elements at multiple levels. Mild biconcave compression fracture of L1 demonstrates vertebral body height loss of approximately 35%. There may be a very mild compression fracture of T9. Cord:  Demonstrates normal signal. Paraspinal and other soft tissues: The patient has a mass in the left chest measuring approximately 9 cm transverse by 9 cm AP 10 cm craniocaudal. The lesion extends posteriorly out of the chest 3 ribs into the soft tissues of the left back including left chest wall and left paraspinous muscles. There is destruction of left ribs at the site of the lesion. Disc levels: T9: Tumor in the pedicles and vertebral body extends into the ventral and lateral epidural spaces effacing CSF about the cord. Tumor deposit measures approximately 2.5 cm craniocaudal by 2 cm transverse by 0.7 cm AP. A large tumor deposit extends from the level of the left T12-L1 foramen to the L1-2 disc interspace. This tumor in falls left paraspinous muscles and extends into the left foramina at T12-L1 and L1-2. It measures approximately 5 cm AP by 4 cm transverse by 5 cm craniocaudal. Tumor deflects the cord to the right and deforms the left side of the cord. It encases the left exiting nerve roots at T12-L1 and L1-2. IMPRESSION: Diffuse osseous metastatic disease secondary to a large left chest mass which extends into the posterior chest wall. Epidural tumor deposits centered at T9 and L1 as described above result in mass effect on the cord and impact  exiting nerve roots. These results were called by telephone at the time of interpretation on 03/27/2019 at 1:45 pm to Dr. Derek Jack , who verbally acknowledged these results. Electronically Signed   By: Inge Rise M.D.   On: 03/27/2019 14:18   Mr Lumbar Spine W Wo Contrast  Result Date: 04/10/2019 CLINICAL DATA:  Bilateral leg weakness.  Metastatic cancer. EXAM: MRI LUMBAR SPINE WITHOUT AND WITH CONTRAST TECHNIQUE: Multiplanar and multiecho pulse sequences of the lumbar spine were obtained without and with intravenous contrast. CONTRAST:  7.5 mL Gadavist COMPARISON:  03/01/2019 FINDINGS: Segmentation:  Standard. Alignment:  Normal. Vertebrae: Widespread marrow replacement and heterogeneous enhancement throughout the spine and visualized sacrum/pelvis consistent with known metastases. Pathologic compression fractures at L1 and L5 with progressive vertebral body height loss now measuring 25% and 55%, respectively. Diminished left-sided epidural tumor at T12-L1 as described on separate thoracic spine MRI. New 1.8 cm focus of dorsal epidural tumor at L3 resulting in mild spinal stenosis. Marked progression of now large volume epidural tumor centered at L5 and extending superiorly to L4 and inferiorly to S1 with severe spinal stenosis and complete effacement of the thecal sac. Extraosseous tumor extension anterior to L5 as well. Progressive destructive sacral tumor with extraosseous tumor extension about the left sacral ala with involvement of the left S1 neural foramen. Conus medullaris and cauda equina: Conus extends to the L1-2 level. New curvilinear enhancement involving multiple cauda equina nerve roots in the mid upper lumbar spine as well as thin enhancement along the surface of the conus. Paraspinal and other soft tissues: Bilateral renal cysts. Disc levels: Mild disc bulging and mild facet hypertrophy from T12-L1 to L3-4 without stenosis. Severe spinal stenosis from L4-S1 due to tumor.  IMPRESSION: 1. Progressive, large volume epidural tumor from L4-S1 with severe spinal stenosis. 2. 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 3. New small volume dorsal epidural tumor at L3 with mild spinal stenosis. 4. L1 and L5 pathologic compression fractures  with progressive vertebral body height loss. 5. Progressive destructive left-sided sacral tumor with involvement of the left S1 neural foramen. Electronically Signed   By: Logan Bores M.D.   On: 04/10/2019 11:29   Dg Chest Portable 1 View  Result Date: 04/09/2019 CLINICAL DATA:  Loss of sensation to bilateral lower extremities. Reported history of metastatic carcinoma and mass left posterior chest wall by recent MRI of the thoracic spine. EXAM: PORTABLE CHEST 1 VIEW COMPARISON:  MRI of the thoracic spine on 03/27/2019 FINDINGS: Lobulated soft tissue mass projects over the lateral left chest and is likely causing bony destruction a segment of the left sixth rib. Aeration at the left lung base has improved since the prior chest x-ray. No significant pleural fluid identified. No evidence of pneumothorax. IMPRESSION: Large soft tissue mass of the left chest causing visible bony destruction of the left sixth rib. Electronically Signed   By: Aletta Edouard M.D.   On: 04/09/2019 17:09   Dg Chest Port 1 View  Result Date: 03/27/2019 CLINICAL DATA:  Metastatic cancer EXAM: PORTABLE CHEST 1 VIEW COMPARISON:  MRI 03/27/2019, chest x-ray 01/17/2011 FINDINGS: Right lung is clear. Diffuse opacity in the left thorax. Borderline cardiomegaly. No pneumothorax. Poorly visible left sixth rib. IMPRESSION: Diffuse opacity within the left thorax, may reflect combination of pleural effusion/disease and underlying lung consolidation or possible mass, given findings on recent spine MRI. Poorly visible left sixth rib, either resected or due to bony destructive change from skeletal metastatic  disease. Chest CT is suggested for further evaluation. Electronically Signed   By: Donavan Foil M.D.   On: 03/27/2019 22:38   Vas [place] Lower Extremity Venous (dvt)  Result Date: 03/31/2019  Lower Venous Study Indications: Swelling.  Comparison Study: No prior studies. Performing Technologist: Oliver Hum RVT  Examination Guidelines: A complete evaluation includes B-mode imaging, spectral Doppler, color Doppler, and power Doppler as needed of all accessible portions of each vessel. Bilateral testing is considered an integral part of a complete examination. Limited examinations for reoccurring indications may be performed as noted.  +---------+---------------+---------+-----------+----------+-------+  RIGHT     Compressibility Phasicity Spontaneity Properties Summary  +---------+---------------+---------+-----------+----------+-------+  CFV       Full            Yes       Yes                             +---------+---------------+---------+-----------+----------+-------+  SFJ       Full                                                      +---------+---------------+---------+-----------+----------+-------+  FV Prox   Full                                                      +---------+---------------+---------+-----------+----------+-------+  FV Mid    Full                                                      +---------+---------------+---------+-----------+----------+-------+  FV Distal Full                                                      +---------+---------------+---------+-----------+----------+-------+  PFV       Full                                                      +---------+---------------+---------+-----------+----------+-------+  POP       Full            Yes       Yes                             +---------+---------------+---------+-----------+----------+-------+  PTV       Full                                                       +---------+---------------+---------+-----------+----------+-------+  PERO      Full                                                      +---------+---------------+---------+-----------+----------+-------+   +----+---------------+---------+-----------+----------+-------+  LEFT Compressibility Phasicity Spontaneity Properties Summary  +----+---------------+---------+-----------+----------+-------+  CFV  Full            Yes       Yes                             +----+---------------+---------+-----------+----------+-------+     Summary: Right: There is no evidence of deep vein thrombosis in the lower extremity. No cystic structure found in the popliteal fossa. Left: No evidence of common femoral vein obstruction.  *See table(s) above for measurements and observations. Electronically signed by Harold Barban MD on 03/31/2019 at 4:37:49 PM.    Final    Ct Maxillofacial Wo Contrast  Result Date: 04/09/2019 CLINICAL DATA:  Fall EXAM: CT HEAD WITHOUT CONTRAST CT MAXILLOFACIAL WITHOUT CONTRAST TECHNIQUE: Multidetector CT imaging of the head and maxillofacial structures were performed using the standard protocol without intravenous contrast. Multiplanar CT image reconstructions of the maxillofacial structures were also generated. COMPARISON:  None. FINDINGS: CT HEAD FINDINGS Brain: There is no mass, hemorrhage or extra-axial collection. The size and configuration of the ventricles and extra-axial CSF spaces are normal. The brain parenchyma is normal, without evidence of acute or chronic infarction. Vascular: No hyperdense vessel or unexpected vascular calcification. Skull: The visualized skull base, calvarium and extracranial soft tissues are normal. CT MAXILLOFACIAL FINDINGS Osseous: --Complex facial fracture types: No LeFort, zygomaticomaxillary complex or nasoorbitoethmoidal fracture. --Simple fracture types: None. --Mandible, hard palate and teeth: No acute abnormality. Orbits: The globes are intact. Normal appearance  of the intra- and extraconal fat. Symmetric extraocular muscles. Sinuses: No fluid levels or advanced mucosal thickening. Soft tissues: Normal visualized extracranial soft tissues. IMPRESSION: 1.  No acute intracranial abnormality. 2. No maxillofacial fracture. Electronically Signed   By: Ulyses Jarred M.D.   On: 04/09/2019 20:48       IMPRESSION/PLAN: 1. 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  She does have disease causing concerns for cord compromise in the thoracic and lumbar spine as well as distraction in the left chest including the chest wall.  Again she is not a surgical candidate at this time, but is a candidate for palliative radiation to the spine and chest wall.  We discussed the risks, benefits, short and long-term effects of treatment as well as the delivery and logistics.  We discussed the plan to treat multiple sites and resume her radiation treatments today. We will proceed with CT SIM this morning with plans to begin treating the new sites of epidural disease at L3-S1 and skull base in addition to completing her last 2 fractions of XRT to the lesions at T9, L1 and chest wall.   We anticipate 10 fractions to the new sites of disease in the lumbar spine and skull base to begin later today. Given the concerns that she does have more cord compromise, she will continue on dexamethasone at 4 mg po every 6 hours and Prilosec for prophylaxis. She will be given taper instructions for the steroids at the appropriate interval.    Nicholos Johns, PA-C    Tyler Pita, MD  [place]: [phone]   Fax: [phone] [email or web address]   Skype   [email or web address]

## 2019-04-12 NOTE — Progress Notes (Addendum)
PROGRESS NOTE    Brandi Rodriguez  ZOX:[id number] DOB: 11-27-1960 DOA: 04/09/2019 PCP: Manon Hilding, MD     Brief Narrative:  58 year old female with a past medical history significant for metastatic lung cancer, with secondary Horner syndrome, chronic respiratory failure requiring 3 L of nasal cannula supplementation and a spinal cord affectation causing urinary retention (Foley dependent) and increased lower extremity weakness.  Who presented with worsening generalized weakness and inability to walk or transfer.  Patient contacted oncology service who has recommended return to emergency department for further evaluation. Imaging studies demonstrated decrease epidural tumor at T9 and T12-L1 without spinal stenosis.  But there is a findings of progressive large epidural tumor from L4-S1 and severe spinal stenosis.  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  Minimal associated mass-effect appreciated.  Case discussed by Dr. Delton Coombes with Dr. Tammi Klippel (radiation oncologist) and has recommended transfer to [facility] for initiation of radiation treatments for lumbar spine and also the brain.   Assessment & Plan:  high-grade neuroendocrine carcinoma with lung primary and  spine/brain metastases,  -she presents to [facility] with weakness, not able to walk for two weeks -cord compression with urinary retention  With indwelling foley ( last changed on 6/24), bilateral lower extremity weakness -on steroids  -Continue treatment as dictated by oncology service.  presumed UTI, with h/o chronic foley  -Patient is afebrile -Currently no complaining of dysuria -Urine culture +ESBL -d/c IV Rocephin, change to meropenem ( she is  immunosuppressed and has neutropenia), foley changed on 6/23  pancytopenia -acute neutropenic, acute thrombocytopenia, acute on chronic anemia -she did not receive any chemo, she has been getting XRT -worsening of pancytopenia from progressive cancer, treatment side effect and uti? -no acute bleeding, neutropenic precaution, will rule out DVT, check retic count, b12, folate, HIT  -will follow hematology/oncology recommendation  essential hypertension -Continue amlodipine and Lasix.   anxiety/depression -Continue Effexor and as needed Xanax. -Mood overall stable -No suicidal ideation or hallucinations.  GERD -Continue PPI  -physical deconditioning and generalized weakness -Physical therapy has evaluated patient and has recommended skilled nursing facility for rehabilitation once medically stable.  SARS-COV 2 screening negative  DVT prophylaxis: Lovenox  Code Status: DNR Family Communication: No family at bedside; patient expressed that she will update them personally. Disposition Plan: not ready to discharge, will need SNF once medically stable  Consultants:   Hematology Oncology   Radiation oncology  Procedures:   XRT  Antimicrobials:  Anti-infectives (From admission, onward)   Start     Dose/Rate Route Frequency Ordered Stop   04/10/19 0130  cefTRIAXone (ROCEPHIN) 1 g in sodium chloride 0.9 % 100 mL IVPB     1 g 200 mL/hr over 30 Minutes Intravenous Every 24 hours 04/10/19 0125     04/09/19 1945  cephALEXin (KEFLEX) capsule 500 mg     500 mg Oral  Once 04/09/19 1941 04/09/19 1945       Subjective:  Currently reports some intermittent back pain, denies suprapubic pain, no fever She reports numbness/tingling  And weakness in both legs, she hope radiation therapy will help improve this She wants to go to a SNF that is close to radiation therapy facility She is Afebrile, reports no nausea, no vomiting, stable  breathing and no chest pain.   She c/o hemorroids   Objective: Vitals:   04/11/19 1435 04/11/19 2147 04/12/19 0057 04/12/19 0533  BP: (!) 171/84 (!) 168/91 (!) 174/97 (!) 164/97  Pulse: 97 95 98 94  Resp: 18 18 20 20   Temp:  98.1 F (36.7 C) 98.6 F (37 C) 97.7 F (36.5 C)  TempSrc:  Oral Oral Oral  SpO2: 97% 96% 96% 97%  Weight:      Height:        Intake/Output Summary (Last 24 hours) at 04/12/2019 1012 Last data filed at 04/12/2019 0900 Gross per 24 hour  Intake 1758.15 ml  Output 3400 ml  Net -1641.85 ml   Filed Weights   04/09/19 1543 04/10/19 0629 04/11/19 0507  Weight: 100.7 kg 94.8 kg 97.4 kg    Examination: General exam: Alert, awake, oriented x 3, continues to be severely weak and deconditioned; No fever, no chest pain, no nausea or vomiting.  Poor balance demonstrated during examination by PT.  Right ptosis appreciated on exam. Respiratory system: Clear to auscultation. Respiratory effort normal. Cardiovascular system:RRR. No murmurs, rubs, gallops. Gastrointestinal system: Abdomen is nondistended, soft and nontender. No organomegaly or masses felt. Normal bowel sounds heard. Central nervous system: Alert and oriented.  No pronation drift; patient with 2-3/5 lower extremity weakness.  Extremities: No cyanosis or clubbing.  Trace edema bilaterally. Skin: No rashes, lesions or ulcers Psychiatry: Judgement and insight appear normal. Mood & affect appropriate.     Data Reviewed: I have personally reviewed following labs and imaging studies  CBC: Recent Labs  Lab 04/09/19 1700 04/10/19 0508  WBC 1.3* 1.3*  NEUTROABS 0.9*  --   HGB 9.8* 8.8*  HCT 31.9* 28.6*  MCV 83.1 82.4  PLT 57* 43*   Basic Metabolic Panel: Recent Labs  Lab 04/09/19 1700 04/10/19 0508  NA 137 140  K 4.0 3.9  CL 99 104  CO2 24 24  GLUCOSE 117* 129*  BUN 21* 18  CREATININE 0.49 0.42*  CALCIUM 9.4 9.1   GFR: Estimated Creatinine Clearance: 89.1 mL/min (A) (by C-G formula based on SCr of 0.42 mg/dL (L)).   Liver Function Tests:  Recent Labs  Lab 04/09/19 1700 04/10/19 0508  AST 67* 62*  ALT 73* 67*  ALKPHOS 232* 236*  BILITOT 1.3* 0.7  PROT 6.7 6.1*  ALBUMIN 2.9* 2.5*   Coagulation Profile: Recent Labs  Lab 04/09/19 2354  INR 1.1    Recent Labs  Lab 04/09/19 1659  GLUCAP 103*   Urine analysis:    Component Value Date/Time   COLORURINE YELLOW 04/09/2019 1815   APPEARANCEUR HAZY (A) 04/09/2019 1815   LABSPEC 1.017 04/09/2019 1815   PHURINE 6.0 04/09/2019 1815   GLUCOSEU NEGATIVE 04/09/2019 1815   HGBUR SMALL (A) 04/09/2019 1815   BILIRUBINUR NEGATIVE 04/09/2019 1815   KETONESUR 5 (A) 04/09/2019 1815   PROTEINUR NEGATIVE 04/09/2019 1815   NITRITE NEGATIVE 04/09/2019 1815   LEUKOCYTESUR TRACE (A) 04/09/2019 1815    Recent Results (from the past 240 hour(s))  Urine Culture     Status: Abnormal   Collection Time: 04/09/19  6:15 PM   Specimen: Urine, Catheterized  Result Value Ref Range Status   Specimen Description   Final    URINE, CATHETERIZED Performed at [facility], [address], [place], [place] [address]    Special Requests   Final    Normal Performed at [facility], [address] [place], [place] [address]    Culture (A)  Final    80,000 COLONIES/mL ESCHERICHIA COLI Confirmed Extended Spectrum Beta-Lactamase Producer (ESBL).  In bloodstream infections from ESBL organisms, carbapenems are preferred over piperacillin/tazobactam. They are shown to have a lower risk of mortality.    Report Status 04/12/2019 FINAL  Final   Organism ID, Bacteria ESCHERICHIA COLI (A)  Final      Susceptibility   Escherichia coli - MIC*    AMPICILLIN >=32 RESISTANT Resistant     CEFAZOLIN >=64 RESISTANT Resistant     CEFTRIAXONE >=64 RESISTANT Resistant     CIPROFLOXACIN >=4 RESISTANT Resistant     GENTAMICIN >=16 RESISTANT Resistant     IMIPENEM <=0.25 SENSITIVE Sensitive     NITROFURANTOIN <=16 SENSITIVE Sensitive     TRIMETH/SULFA >=320 RESISTANT Resistant     AMPICILLIN/SULBACTAM >=32  RESISTANT Resistant     PIP/TAZO 8 SENSITIVE Sensitive     Extended ESBL POSITIVE Resistant     * 80,000 COLONIES/mL ESCHERICHIA COLI  Novel Coronavirus,NAA,(SEND-OUT TO REF LAB - TAT 24-48 hrs); Hosp Order     Status: None   Collection Time: 04/10/19  1:56 AM   Specimen: Nasopharyngeal Swab; Respiratory  Result Value Ref Range Status   SARS-CoV-2, NAA NOT DETECTED NOT DETECTED Final    Comment: (NOTE) This test was developed and its performance characteristics determined by [employer]. This test has not been FDA cleared or approved. This test has been authorized by FDA under an Emergency Use Authorization (EUA). This test is only authorized for the duration of time the declaration that circumstances exist justifying the authorization of the emergency use of in vitro diagnostic tests for detection of SARS-CoV-2 virus and/or diagnosis of COVID-19 infection under section 564(b)(1) of the Act, 21 U.S.C. 703JKK-9(F)(8), unless the authorization is terminated or revoked sooner. When diagnostic testing is negative, the possibility of a false negative result should be considered in the context of a patient's recent exposures and the presence of clinical signs and symptoms consistent with COVID-19. An individual without symptoms of COVID-19 and who is not shedding SARS-CoV-2 virus would expect to have a negative (not detected) result in this assay. Performed  At: [facility] [place], [place] [id number] Rush Farmer MD RC:[id number]    Adrian  Final    Comment: Performed at [facility], [address] [place], [place] [address]    Radiology Studies: No results found.  Scheduled Meds: . amLODipine  5 mg Oral Daily  . dexamethasone  4 mg Oral Q6H  . feeding supplement (PRO-STAT SUGAR FREE 64)  30 mL Oral BID  . furosemide  20 mg Oral Daily  . pantoprazole  40 mg Oral Daily  . venlafaxine XR  75 mg Oral QHS   Continuous  Infusions: . sodium chloride 1,000 mL (04/12/19 0037)  . cefTRIAXone (ROCEPHIN)  IV 1 g (04/12/19 0052)     LOS: 2 days    Time spent: 30 minutes.    Florencia Reasons, MD PhD [facility] Pager [phone]   04/12/2019, 10:12 AM

## 2019-04-12 NOTE — Progress Notes (Signed)
Pharmacy Antibiotic Note  Brandi Rodriguez is a 58 y.o. female admitted on 04/09/2019 with UTI.  Pharmacy has been consulted for meropenem dosing. Patient with significant past medical history of high-grade neuroendocrine carcinoma, currently receiving radiation treatment. Patient was on Ceftriaxone 1g IV q24h from 6/24 - 6/26 for E. Coli, but today patient's urine culture shows ESBL E. Coli. Patient with low WBC count of 1.3, but patient remains afebrile.   Plan: Stop Ceftriaxone  Start meropenem 1g IV q8h Monitor renal function, CBC, and clinical progress  Height: 5\' 6"  (167.6 cm) Weight: 214 lb 11.7 oz (97.4 kg) IBW/kg (Calculated) : 59.3  Temp (24hrs), Avg:98.1 F (36.7 C), Min:97.7 F (36.5 C), Max:98.6 F (37 C)  Recent Labs  Lab 04/09/19 1700 04/10/19 0508  WBC 1.3* 1.3*  CREATININE 0.49 0.42*    Estimated Creatinine Clearance: 89.1 mL/min (A) (by C-G formula based on SCr of 0.42 mg/dL (L)).    Allergies  Allergen Reactions  . Hydrocodone Other (See Comments)    Makes heart pound    Antimicrobials this admission: CTX 6/24 >> 6/26 Meropenem 6/26 >>   Dose adjustments this admission: N/A  Microbiology results: 6/23 UCx: 80 K ESBL E. Coli (S to imipenem)    Thank you for allowing pharmacy to be a part of this patient's care.  Britt Boozer 04/12/2019 10:29 AM

## 2019-04-12 NOTE — Progress Notes (Signed)
Received patient at 46  AOX3 reviewed plan of care, oriented to room  Resting quietly at present

## 2019-04-12 NOTE — Progress Notes (Signed)
  Radiation Oncology         [phone] ________________________________  Name: Icis Budreau MRN: [id number]  Date: 04/12/2019  DOB: [birth year]   INPATIENT  SIMULATION AND TREATMENT PLANNING NOTE    ICD-10-CM   1. Metastatic cancer to spine ([place])  C79.51   2. Brain metastasis ([place])  C79.31     DIAGNOSIS:  58 yo woman with bone mets  NARRATIVE:  The patient was brought to the [facility].  Identity was confirmed.  All relevant records and images related to the planned course of therapy were reviewed.  The patient freely provided informed written consent to proceed with treatment after reviewing the details related to the planned course of therapy. The consent form was witnessed and verified by the simulation staff.  Then, the patient was set-up in a stable reproducible  supine position for radiation therapy.  CT images were obtained.  Surface markings were placed.  The CT images were loaded into the planning software.  Then the target and avoidance structures were contoured.  Treatment planning then occurred.  The radiation prescription was entered and confirmed.  Then, I designed and supervised the construction of a total of 3 medically necessary complex treatment devices, including a custom made thermoplastic mask used for immobilization and two complex multileaf collimators to cover the entire intracranial contents, while shielding the eyes and face.  Each [facility] is independently created to account for beam divergence.  The right and left lateral fields will be treated with 6 MV X-rays.  I have requested : Isodose Plan.    PLAN:  The whole brain will be treated to 30 Gy in 10 fractions to the skull base, covering sella and temporal fossa lesion, with a second site covering L3 through S2 spine  ________________________________  Sheral Apley Tammi Klippel, M.D.

## 2019-04-13 ENCOUNTER — Inpatient Hospital Stay (HOSPITAL_COMMUNITY): Payer: BC Managed Care – PPO

## 2019-04-13 DIAGNOSIS — C7931 Secondary malignant neoplasm of brain: Secondary | ICD-10-CM

## 2019-04-13 DIAGNOSIS — C7A8 Other malignant neuroendocrine tumors: Secondary | ICD-10-CM

## 2019-04-13 DIAGNOSIS — C7951 Secondary malignant neoplasm of bone: Principal | ICD-10-CM

## 2019-04-13 DIAGNOSIS — R531 Weakness: Secondary | ICD-10-CM

## 2019-04-13 DIAGNOSIS — R609 Edema, unspecified: Secondary | ICD-10-CM

## 2019-04-13 DIAGNOSIS — C799 Secondary malignant neoplasm of unspecified site: Secondary | ICD-10-CM

## 2019-04-13 LAB — CBC WITH DIFFERENTIAL/PLATELET
Abs Immature Granulocytes: 0.12 10*3/uL — ABNORMAL HIGH (ref 0.00–0.07)
Basophils Absolute: 0 10*3/uL (ref 0.0–0.1)
Basophils Relative: 0 %
Eosinophils Absolute: 0 10*3/uL (ref 0.0–0.5)
Eosinophils Relative: 0 %
HCT: 29.2 % — ABNORMAL LOW (ref 36.0–46.0)
Hemoglobin: 8.9 g/dL — ABNORMAL LOW (ref 12.0–15.0)
Immature Granulocytes: 12 %
Lymphocytes Relative: 6 %
Lymphs Abs: 0.1 10*3/uL — ABNORMAL LOW (ref 0.7–4.0)
MCH: 25.4 pg — ABNORMAL LOW (ref 26.0–34.0)
MCHC: 30.5 g/dL (ref 30.0–36.0)
MCV: 83.4 fL (ref 80.0–100.0)
Monocytes Absolute: 0.1 10*3/uL (ref 0.1–1.0)
Monocytes Relative: 6 %
Neutro Abs: 0.8 10*3/uL — ABNORMAL LOW (ref 1.7–7.7)
Neutrophils Relative %: 76 %
Platelets: 20 10*3/uL — CL (ref 150–400)
RBC: 3.5 MIL/uL — ABNORMAL LOW (ref 3.87–5.11)
RDW: 14.4 % (ref 11.5–15.5)
WBC: 1 10*3/uL — CL (ref 4.0–10.5)
nRBC: 2.9 % — ABNORMAL HIGH (ref 0.0–0.2)

## 2019-04-13 LAB — RETICULOCYTES
Immature Retic Fract: 9.8 % (ref 2.3–15.9)
RBC.: 3.5 MIL/uL — ABNORMAL LOW (ref 3.87–5.11)
Retic Count, Absolute: 23.5 10*3/uL (ref 19.0–186.0)
Retic Ct Pct: 0.7 % (ref 0.4–3.1)

## 2019-04-13 LAB — MAGNESIUM: Magnesium: 2.2 mg/dL (ref 1.7–2.4)

## 2019-04-13 LAB — FOLATE: Folate: 9.3 ng/mL (ref >5.9)

## 2019-04-13 LAB — COMPREHENSIVE METABOLIC PANEL
ALT: 74 U/L — ABNORMAL HIGH (ref 0–44)
AST: 50 U/L — ABNORMAL HIGH (ref 15–41)
Albumin: 2.6 g/dL — ABNORMAL LOW (ref 3.5–5.0)
Alkaline Phosphatase: 188 U/L — ABNORMAL HIGH (ref 38–126)
Anion gap: 10 (ref 5–15)
BUN: 22 mg/dL — ABNORMAL HIGH (ref 6–20)
CO2: 23 mmol/L (ref 22–32)
Calcium: 8.8 mg/dL — ABNORMAL LOW (ref 8.9–10.3)
Chloride: 107 mmol/L (ref 98–111)
Creatinine, Ser: 0.36 mg/dL — ABNORMAL LOW (ref 0.44–1.00)
GFR calc Af Amer: >60 mL/min (ref >60)
GFR calc non Af Amer: >60 mL/min (ref >60)
Glucose, Bld: 107 mg/dL — ABNORMAL HIGH (ref 70–99)
Potassium: 4.1 mmol/L (ref 3.5–5.1)
Sodium: 140 mmol/L (ref 135–145)
Total Bilirubin: 0.5 mg/dL (ref 0.3–1.2)
Total Protein: 6.1 g/dL — ABNORMAL LOW (ref 6.5–8.1)

## 2019-04-13 LAB — VITAMIN B12: Vitamin B-12: 744 pg/mL (ref 180–914)

## 2019-04-13 MED ORDER — CYCLOBENZAPRINE HCL 5 MG PO TABS
5.0000 mg | ORAL_TABLET | Freq: Once | ORAL | Status: AC
  Administered 2019-04-13: 5 mg via ORAL
  Filled 2019-04-13: qty 1

## 2019-04-13 MED ORDER — METOPROLOL TARTRATE 12.5 MG HALF TABLET
12.5000 mg | ORAL_TABLET | Freq: Two times a day (BID) | ORAL | Status: DC
  Administered 2019-04-13 – 2019-04-26 (×25): 12.5 mg via ORAL
  Filled 2019-04-13 (×26): qty 1

## 2019-04-13 NOTE — Progress Notes (Signed)
PROGRESS NOTE    Brandi Rodriguez  LPF:[id number] DOB: [birth year] DOA: 04/09/2019 PCP: Manon Hilding, MD     Brief Narrative:  58 year old female with a past medical history significant for metastatic lung cancer, with secondary Horner syndrome, chronic respiratory failure requiring 3 L of nasal cannula supplementation and a spinal cord affectation causing urinary retention (Foley dependent) and increased lower extremity weakness.  Who presented with worsening generalized weakness and inability to walk or transfer.  Patient contacted oncology service who has recommended return to emergency department for further evaluation. Imaging studies demonstrated decrease epidural tumor at T9 and T12-L1 without spinal stenosis.  But there is a findings of progressive large epidural tumor from L4-S1 and severe spinal stenosis.  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  Minimal associated mass-effect appreciated.  Case discussed by Dr. Delton Coombes with Dr. Tammi Klippel (radiation oncologist) and has recommended transfer to [facility] for initiation of radiation treatments for lumbar spine and also the brain.   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 with weakness, not able to walk for two weeks -cord compression with urinary retention s/p indwelling foley ( last changed on 6/24), bilateral lower extremity weakness, not able to ambulate -on steroids  -Continue treatment as dictated by oncology service.  Pancytopenia -acute neutropenic, acute thrombocytopenia, acute on chronic anemia, wbc/plt was normal two weeks ago -she did not receive any chemo, she has been  getting XRT -worsening of pancytopenia from progressive cancer, treatment side effect and uti? 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 -appreciated hematology/oncology Dr Lindi Adie recommendation, supportive transfusion to keep hgb > 7, plt >10k, no GSCF when patient gets XRT per Dr Lindi Adie -d/c prophylaxic lovenox, start scd's  presumed UTI, with h/o chronic foley  -Patient is afebrile -she does reports urine was cloudy at home -Urine culture +ESBL -d/c IV Rocephin, change to meropenem ( she is immunosuppressed and has neutropenia), foley changed on 6/23 -plan to continue meropenem until neutropenia improves , urine is clear today   essential hypertension/short run of SVT on tele -d/c norvasc, change to lopressor, keek K> 4, mag>2 -continue lasix    anxiety/depression -Continue Effexor and as needed Xanax. -Mood overall stable -No suicidal ideation or hallucinations.  GERD -Continue PPI  physical deconditioning and generalized weakness -Physical therapy has evaluated patient and has recommended skilled nursing facility for rehabilitation once medically stable.  SARS-COV 2 screening negative  DVT prophylaxis: scd's  Code Status: DNR Family Communication: No family at bedside; patient expressed that she will update them personally. Disposition Plan: not ready to discharge, needs improvement in neutropenic and thrombocytopenia,  will need SNF once medically stable  Consultants:   Hematology Oncology   Radiation oncology  Procedures:   XRT  Antimicrobials:  Anti-infectives (From admission, onward)   Start     Dose/Rate Route Frequency Ordered Stop   04/12/19 1100  meropenem (MERREM) 1 g in sodium chloride 0.9 % 100 mL IVPB     1 g 200 mL/hr over 30 Minutes Intravenous Every 8 hours 04/12/19 1013     04/10/19 0130  cefTRIAXone (ROCEPHIN) 1 g in  sodium chloride 0.9 % 100 mL  IVPB  Status:  Discontinued     1 g 200 mL/hr over 30 Minutes Intravenous Every 24 hours 04/10/19 0125 04/12/19 1013   04/09/19 1945  cephALEXin (KEFLEX) capsule 500 mg     500 mg Oral  Once 04/09/19 1941 04/09/19 1945       Subjective:  She is not in acute distress, talking to her family over the phone  Reports hemorrhoids cream helped  Legs pain is controlled by prn analgesics  Neutropenic and thrombocytopenia  getting worse, No fever, good urine output with clear urine in foley No sign of bleeding  Objective: Vitals:   04/12/19 0533 04/12/19 1418 04/12/19 2100 04/13/19 0452  BP: (!) 164/97 (!) 163/96 (!) 131/91 (!) 149/85  Pulse: 94 97 (!) 103 99  Resp: 20 20 20 20   Temp: 97.7 F (36.5 C) 98.3 F (36.8 C) 97.7 F (36.5 C) 98.1 F (36.7 C)  TempSrc: Oral Oral Oral Oral  SpO2: 97% 95% 100% 96%  Weight:    96.5 kg  Height:        Intake/Output Summary (Last 24 hours) at 04/13/2019 0945 Last data filed at 04/13/2019 0600 Gross per 24 hour  Intake 1180.45 ml  Output 2600 ml  Net -1419.55 ml   Filed Weights   04/10/19 0629 04/11/19 0507 04/13/19 0452  Weight: 94.8 kg 97.4 kg 96.5 kg    Examination: General exam: Alert, awake, oriented x 3, continues to be severely weak and deconditioned; No fever, no chest pain, no nausea or vomiting.  Poor balance demonstrated during examination by PT.  Right ptosis appreciated on exam. Respiratory system: Clear to auscultation. Respiratory effort normal. Cardiovascular system:RRR. No murmurs, rubs, gallops. Gastrointestinal system: Abdomen is nondistended, soft and nontender. No organomegaly or masses felt. Normal bowel sounds heard. Central nervous system: Alert and oriented.  No pronation drift; patient with 2-3/5 lower extremity weakness.  Extremities: No cyanosis or clubbing.  Trace edema bilaterally. Skin: No rashes, lesions or ulcers Psychiatry: Judgement and insight appear normal. Mood & affect appropriate.     Data  Reviewed: I have personally reviewed following labs and imaging studies  CBC: Recent Labs  Lab 04/09/19 1700 04/10/19 0508 04/13/19 0536  WBC 1.3* 1.3* 1.0*  NEUTROABS 0.9*  --  0.8*  HGB 9.8* 8.8* 8.9*  HCT 31.9* 28.6* 29.2*  MCV 83.1 82.4 83.4  PLT 57* 43* 20*   Basic Metabolic Panel: Recent Labs  Lab 04/09/19 1700 04/10/19 0508 04/13/19 0536  NA 137 140 140  K 4.0 3.9 4.1  CL 99 104 107  CO2 24 24 23   GLUCOSE 117* 129* 107*  BUN 21* 18 22*  CREATININE 0.49 0.42* 0.36*  CALCIUM 9.4 9.1 8.8*  MG  --   --  2.2   GFR: Estimated Creatinine Clearance: 88.7 mL/min (A) (by C-G formula based on SCr of 0.36 mg/dL (L)).   Liver Function Tests: Recent Labs  Lab 04/09/19 1700 04/10/19 0508 04/13/19 0536  AST 67* 62* 50*  ALT 73* 67* 74*  ALKPHOS 232* 236* 188*  BILITOT 1.3* 0.7 0.5  PROT 6.7 6.1* 6.1*  ALBUMIN 2.9* 2.5* 2.6*   Coagulation Profile: Recent Labs  Lab 04/09/19 2354  INR 1.1    Recent Labs  Lab 04/09/19 1659  GLUCAP 103*   Urine analysis:    Component Value Date/Time   COLORURINE YELLOW 04/09/2019 1815   APPEARANCEUR HAZY (A) 04/09/2019 1815   LABSPEC 1.017 04/09/2019 1815   PHURINE  6.0 04/09/2019 1815   GLUCOSEU NEGATIVE 04/09/2019 1815   HGBUR SMALL (A) 04/09/2019 1815   BILIRUBINUR NEGATIVE 04/09/2019 1815   KETONESUR 5 (A) 04/09/2019 1815   PROTEINUR NEGATIVE 04/09/2019 1815   NITRITE NEGATIVE 04/09/2019 1815   LEUKOCYTESUR TRACE (A) 04/09/2019 1815    Recent Results (from the past 240 hour(s))  Urine Culture     Status: Abnormal   Collection Time: 04/09/19  6:15 PM   Specimen: Urine, Catheterized  Result Value Ref Range Status   Specimen Description   Final    URINE, CATHETERIZED Performed at [facility], [address] [place], [place] [address]    Special Requests   Final    Normal Performed at [facility], [address] [place], [place] [address]    Culture (A)  Final    80,000 COLONIES/mL ESCHERICHIA COLI  Confirmed Extended Spectrum Beta-Lactamase Producer (ESBL).  In bloodstream infections from ESBL organisms, carbapenems are preferred over piperacillin/tazobactam. They are shown to have a lower risk of mortality.    Report Status 04/12/2019 FINAL  Final   Organism ID, Bacteria ESCHERICHIA COLI (A)  Final      Susceptibility   Escherichia coli - MIC*    AMPICILLIN >=32 RESISTANT Resistant     CEFAZOLIN >=64 RESISTANT Resistant     CEFTRIAXONE >=64 RESISTANT Resistant     CIPROFLOXACIN >=4 RESISTANT Resistant     GENTAMICIN >=16 RESISTANT Resistant     IMIPENEM <=0.25 SENSITIVE Sensitive     NITROFURANTOIN <=16 SENSITIVE Sensitive     TRIMETH/SULFA >=320 RESISTANT Resistant     AMPICILLIN/SULBACTAM >=32 RESISTANT Resistant     PIP/TAZO 8 SENSITIVE Sensitive     Extended ESBL POSITIVE Resistant     * 80,000 COLONIES/mL ESCHERICHIA COLI  Novel Coronavirus,NAA,(SEND-OUT TO REF LAB - TAT 24-48 hrs); Hosp Order     Status: None   Collection Time: 04/10/19  1:56 AM   Specimen: Nasopharyngeal Swab; Respiratory  Result Value Ref Range Status   SARS-CoV-2, NAA NOT DETECTED NOT DETECTED Final    Comment: (NOTE) This test was developed and its performance characteristics determined by [employer]. This test has not been FDA cleared or approved. This test has been authorized by FDA under an Emergency Use Authorization (EUA). This test is only authorized for the duration of time the declaration that circumstances exist justifying the authorization of the emergency use of in vitro diagnostic tests for detection of SARS-CoV-2 virus and/or diagnosis of COVID-19 infection under section 564(b)(1) of the Act, 21 U.S.C. 017PZW-2(H)(8), unless the authorization is terminated or revoked sooner. When diagnostic testing is negative, the possibility of a false negative result should be considered in the context of a patient's recent exposures and the presence of clinical signs and symptoms  consistent with COVID-19. An individual without symptoms of COVID-19 and who is not shedding SARS-CoV-2 virus would expect to have a negative (not detected) result in this assay. Performed  At: [facility] [place], [place] [id number] Rush Farmer MD NT:[id number]    [place]  Final    Comment: Performed at [facility] [address] [place], [place] [address]    Radiology Studies: No results found.  Scheduled Meds: . amLODipine  5 mg Oral Daily  . dexamethasone  4 mg Oral Q6H  . feeding supplement (PRO-STAT SUGAR FREE 64)  30 mL Oral BID  . furosemide  20 mg Oral Daily  . hydrocortisone   Rectal BID  . pantoprazole  40  mg Oral Daily  . senna-docusate  1 tablet Oral BID  . venlafaxine XR  75 mg Oral QHS   Continuous Infusions: . meropenem (MERREM) IV 1 g (04/13/19 0245)     LOS: 3 days    Time spent: 30 minutes.    Florencia Reasons, MD PhD [facility] Pager [phone]   04/13/2019, 9:45 AM

## 2019-04-13 NOTE — Progress Notes (Signed)
HEMATOLOGY-ONCOLOGY PROGRESS NOTE  SUBJECTIVE: Patient with history of malignant neuroendocrine tumor with spine and brain metastases transferred from [facility] for whole brain radiation.  She was a patient of Dr. Delton Coombes when a CT done on 03/27/2019 revealed large left chest mass extending into the posterior chest wall with epidural tumor deposits T9 and L1 with mass-effect.  Left axillary lymph node biopsy revealed high-grade malignant neuroendocrine carcinoma.  She underwent palliative radiation therapy to the spine over the past 2 weeks. MRI of the brain showed extra-axial mass in the left middle cranial fossa involving the sphenoid wing 2 cm in size with a sellar mass.  She was seen by Dr. Tammi Klippel who started her on whole brain radiation.  OBJECTIVE: REVIEW OF SYSTEMS:   Positive for fatigue, generalized weakness   PHYSICAL EXAMINATION: ECOG PERFORMANCE STATUS: 2 - Symptomatic, <50% confined to bed  Vitals:   04/12/19 2100 04/13/19 0452  BP: (!) 131/91 (!) 149/85  Pulse: (!) 103 99  Resp: 20 20  Temp: 97.7 F (36.5 C) 98.1 F (36.7 C)  SpO2: 100% 96%   Filed Weights   04/10/19 0629 04/11/19 0507 04/13/19 0452  Weight: 208 lb 15.9 oz (94.8 kg) 214 lb 11.7 oz (97.4 kg) 212 lb 11.9 oz (96.5 kg)    GENERAL:alert, no distress and comfortable SKIN: skin color, texture, turgor are normal, no rashes or significant lesions EYES: Diplopia, left eyelid droop OROPHARYNX:no exudate, no erythema and lips, buccal mucosa, and tongue normal  NECK: supple, thyroid normal size, non-tender, without nodularity LYMPH:  no palpable lymphadenopathy in the cervical, axillary or inguinal LUNGS: clear to auscultation and percussion with normal breathing effort HEART: regular rate & rhythm and no murmurs and no lower extremity edema ABDOMEN:abdomen soft, non-tender and normal bowel sounds Musculoskeletal:no cyanosis of digits and no clubbing  NEURO: Generalized weakness  LABORATORY DATA:   I have reviewed the data as listed CMP Latest Ref Rng & Units 04/13/2019 04/10/2019 04/09/2019  Glucose 70 - 99 mg/dL 107(H) 129(H) 117(H)  BUN 6 - 20 mg/dL 22(H) 18 21(H)  Creatinine 0.44 - 1.00 mg/dL 0.36(L) 0.42(L) 0.49  Sodium 135 - 145 mmol/L 140 140 137  Potassium 3.5 - 5.1 mmol/L 4.1 3.9 4.0  Chloride 98 - 111 mmol/L 107 104 99  CO2 22 - 32 mmol/L 23 24 24   Calcium 8.9 - 10.3 mg/dL 8.8(L) 9.1 9.4  Total Protein 6.5 - 8.1 g/dL 6.1(L) 6.1(L) 6.7  Total Bilirubin 0.3 - 1.2 mg/dL 0.5 0.7 1.3(H)  Alkaline Phos 38 - 126 U/L 188(H) 236(H) 232(H)  AST 15 - 41 U/L 50(H) 62(H) 67(H)  ALT 0 - 44 U/L 74(H) 67(H) 73(H)    Lab Results  Component Value Date   WBC 1.0 (LL) 04/13/2019   HGB 8.9 (L) 04/13/2019   HCT 29.2 (L) 04/13/2019   MCV 83.4 04/13/2019   PLT 20 (LL) 04/13/2019   NEUTROABS 0.8 (L) 04/13/2019    ASSESSMENT AND PLAN: 1.  Metastatic malignant neuroendocrine carcinoma involving the spine and brain and chest.  Currently on palliative radiation therapy to the brain.  She completed palliative radiation to the spine. 2.  We are consulted for the pancytopenia.  2 weeks prior to this her blood counts were normal.  Therefore the pancytopenia is directly related to prior to radiation therapy. We do not like to give growth factor injections when radiation is ongoing. For the anemia and thrombocytopenia we can do supportive care with blood or platelet transfusions.  Recommendation: Blood transfusion  if hemoglobin drops below 7 and platelet transfusion if platelets drop below 10.  I discussed with the patient that until the radiation is complete the blood counts will not come up.  Will discuss with Dr. Tammi Klippel on Monday.

## 2019-04-13 NOTE — Evaluation (Signed)
Occupational Therapy Evaluation Patient Details Name: Brandi Rodriguez MRN: [id number] DOB: 1961/03/30 Today's Date: 04/13/2019    History of Present Illness Lyrica Mcclarty  is a 58 y.o. female,  58 y.o. female with medical history significant for metastatic cancer who was sent to the [facility] ED from the cancer center 03/27/19 with reports of falls and lower extremity weakness. She reported lower extremity weakness left worse than right that started one day PTA with pain in her back also. One week prior she noticed that her right eyelid drooped.   Clinical Impression   Pt was transferred from [facility] for the above. She had assistance at baseline and stated that she was trying to get a sliding board.  Evaluation was limited by pt's wishes due to pain. Will follow in acute setting with the goals listed below.      Follow Up Recommendations  SNF    Equipment Recommendations  (tba further; may need drop arm commode/hosp bed/sliding boar)    Recommendations for Other Services       Precautions / Restrictions Precautions Precautions: Fall Precaution Comments: undergoing XRT, labs low Restrictions Weight Bearing Restrictions: No      Mobility Bed Mobility               General bed mobility comments: not performed  Transfers                 General transfer comment: not performed; did lateral scoot with PT on their last visit with max A    Balance                                           ADL either performed or assessed with clinical judgement   ADL Overall ADL's : Needs assistance/impaired Eating/Feeding: Independent   Grooming: Wash/dry face;Set up   Upper Body Bathing: Minimal assistance   Lower Body Bathing: Total assistance;+2 for physical assistance   Upper Body Dressing : Minimal assistance   Lower Body Dressing: Total assistance;+2 for physical assistance                 General ADL Comments: pt did not want to  mobilize either rolling or sitting EOB today.  C/O bil LE pain     Vision         Perception     Praxis      Pertinent Vitals/Pain Pain Assessment: Faces Faces Pain Scale: Hurts even more Pain Location: bil LEs Pain Intervention(s): Limited activity within patient's tolerance;Monitored during session;Repositioned(towel rolls under feet)     Hand Dominance     Extremity/Trunk Assessment Upper Extremity Assessment Upper Extremity Assessment: Generalized weakness(MMT deferred; plts 20)   Lower Extremity Assessment LLE Deficits / Details: only able to bend L knee a little on her own to prepare for roll or to bring up for adls.  Able to move both with assistance       Communication Communication Communication: No difficulties   Cognition Arousal/Alertness: Awake/alert Behavior During Therapy: WFL for tasks assessed/performed Overall Cognitive Status: No family/caregiver present to determine baseline cognitive functioning                                 General Comments: follows commands; PLOF vague   General Comments       Exercises Exercises: Other  exercises:  Performed at pt's request. She wants to improve leg movement  General Exercises - Lower Extremity Ankle Circles/Pumps: AROM;Both Heel Slides: AAROM;Both Hip ABduction/ADduction: AAROM;Both Other Exercises Other Exercises: shoulder FF  5 reps AROM bil with rails down   Shoulder Instructions      Home Living Family/patient expects to be discharged to:: Unsure                                 Additional Comments: pt agreeable to snf per chart      Prior Functioning/Environment    Gait / Transfers Assistance Needed: non-ambulatory for last 3 weeks, was using [facility] for household distances, since assisted transfers ADL's / Homemaking Assistance Needed: assisted by family            OT Problem List: Decreased strength;Decreased range of motion;Decreased activity  tolerance;Impaired balance (sitting and/or standing);Decreased knowledge of use of DME or AE;Pain      OT Treatment/Interventions: Self-care/ADL training;Therapeutic exercise;Energy conservation;DME and/or AE instruction;Therapeutic activities;Patient/family education;Balance training    OT Goals(Current goals can be found in the care plan section) Acute Rehab OT Goals Patient Stated Goal: return home after rehab and get leg strength back OT Goal Formulation: With patient Time For Goal Achievement: 04/30/2019 Potential to Achieve Goals: Fair ADL Goals Pt Will Transfer to Toilet: with mod assist(lateral transfer to drop arm commode) Additional ADL Goal #1: perform bed mobility at min A level in preparation for toilet transfers Additional ADL Goal #2: pt will perform LB adls with mod A with AE, sit to supine/rolling Additional ADL Goal #3: pt will be independent with AROM to level one theraband (as platelets allow) with written HEP  OT Frequency: Min 2X/week   Barriers to D/C:            Co-evaluation              AM-PAC OT "6 Clicks" Daily Activity     Outcome Measure Help from another person eating meals?: None Help from another person taking care of personal grooming?: A Little Help from another person toileting, which includes using toliet, bedpan, or urinal?: Total Help from another person bathing (including washing, rinsing, drying)?: A Lot Help from another person to put on and taking off regular upper body clothing?: A Little Help from another person to put on and taking off regular lower body clothing?: Total 6 Click Score: 14   End of Session    Activity Tolerance: Patient tolerated treatment well Patient left: in bed;with call bell/phone within reach;with bed alarm set  OT Visit Diagnosis: Muscle weakness (generalized) (M62.81);Pain Pain - part of body: Leg(bil)                Time: 7893-8101 OT Time Calculation (min): 22 min Charges:  OT General Charges $OT  Visit: 1 Visit OT Evaluation $OT Eval Low Complexity: [place], OTR/L Acute Rehabilitation Services [phone] [facility] pager [phone] office 04/13/2019  [place] 04/13/2019, 2:58 PM

## 2019-04-13 NOTE — Progress Notes (Signed)
Bilateral lower extremity venous duplex has been completed. Preliminary results can be found in CV Proc through chart review.   04/13/19 9:55 AM Carlos Levering RVT

## 2019-04-14 DIAGNOSIS — G9520 Unspecified cord compression: Secondary | ICD-10-CM

## 2019-04-14 LAB — COMPREHENSIVE METABOLIC PANEL
ALT: 69 U/L — ABNORMAL HIGH (ref 0–44)
AST: 44 U/L — ABNORMAL HIGH (ref 15–41)
Albumin: 2.6 g/dL — ABNORMAL LOW (ref 3.5–5.0)
Alkaline Phosphatase: 178 U/L — ABNORMAL HIGH (ref 38–126)
Anion gap: 13 (ref 5–15)
BUN: 24 mg/dL — ABNORMAL HIGH (ref 6–20)
CO2: 21 mmol/L — ABNORMAL LOW (ref 22–32)
Calcium: 8.7 mg/dL — ABNORMAL LOW (ref 8.9–10.3)
Chloride: 106 mmol/L (ref 98–111)
Creatinine, Ser: <0.3 mg/dL — ABNORMAL LOW (ref 0.44–1.00)
Glucose, Bld: 110 mg/dL — ABNORMAL HIGH (ref 70–99)
Potassium: 4.3 mmol/L (ref 3.5–5.1)
Sodium: 140 mmol/L (ref 135–145)
Total Bilirubin: 0.6 mg/dL (ref 0.3–1.2)
Total Protein: 5.9 g/dL — ABNORMAL LOW (ref 6.5–8.1)

## 2019-04-14 LAB — CBC WITH DIFFERENTIAL/PLATELET
Abs Immature Granulocytes: 0.08 10*3/uL — ABNORMAL HIGH (ref 0.00–0.07)
Basophils Absolute: 0 10*3/uL (ref 0.0–0.1)
Basophils Relative: 1 %
Eosinophils Absolute: 0 10*3/uL (ref 0.0–0.5)
Eosinophils Relative: 0 %
HCT: 27.4 % — ABNORMAL LOW (ref 36.0–46.0)
Hemoglobin: 8.5 g/dL — ABNORMAL LOW (ref 12.0–15.0)
Immature Granulocytes: 8 %
Lymphocytes Relative: 9 %
Lymphs Abs: 0.1 10*3/uL — ABNORMAL LOW (ref 0.7–4.0)
MCH: 25.6 pg — ABNORMAL LOW (ref 26.0–34.0)
MCHC: 31 g/dL (ref 30.0–36.0)
MCV: 82.5 fL (ref 80.0–100.0)
Monocytes Absolute: 0.1 10*3/uL (ref 0.1–1.0)
Monocytes Relative: 6 %
Neutro Abs: 0.8 10*3/uL — ABNORMAL LOW (ref 1.7–7.7)
Neutrophils Relative %: 76 %
Platelets: 15 10*3/uL — CL (ref 150–400)
RBC: 3.32 MIL/uL — ABNORMAL LOW (ref 3.87–5.11)
RDW: 14.2 % (ref 11.5–15.5)
WBC: 1 10*3/uL — CL (ref 4.0–10.5)
nRBC: 2 % — ABNORMAL HIGH (ref 0.0–0.2)

## 2019-04-14 LAB — HEPARIN INDUCED PLATELET AB (HIT ANTIBODY): Heparin Induced Plt Ab: 0.056 {OD_unit} (ref 0.000–0.400)

## 2019-04-14 MED ORDER — DULOXETINE HCL 20 MG PO CPEP
20.0000 mg | ORAL_CAPSULE | Freq: Two times a day (BID) | ORAL | Status: DC
  Administered 2019-04-14 – 2019-04-26 (×23): 20 mg via ORAL
  Filled 2019-04-14 (×25): qty 1

## 2019-04-14 NOTE — Progress Notes (Signed)
PROGRESS NOTE    Brandi Rodriguez  MPN:[id number] DOB: January 19, 1961 DOA: 04/09/2019 PCP: Manon Hilding, MD     Brief Narrative:  58 year old female with a past medical history significant for metastatic lung cancer, with secondary Horner syndrome, chronic respiratory failure requiring 3 L of nasal cannula supplementation and a spinal cord affectation causing urinary retention (Foley dependent) and increased lower extremity weakness.  Who presented with worsening generalized weakness and inability to walk or transfer.  Patient contacted oncology service who has recommended return to emergency department for further evaluation. Imaging studies demonstrated decrease epidural tumor at T9 and T12-L1 without spinal stenosis.  But there is a findings of progressive large epidural tumor from L4-S1 and severe spinal stenosis.  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  Minimal associated mass-effect appreciated.  Case discussed by Dr. Delton Coombes with Dr. Tammi Klippel (radiation oncologist) and has recommended transfer to [facility] for initiation of radiation treatments for lumbar spine and also the brain.   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 -appreciated hematology/oncology Dr Lindi Adie recommendation, supportive transfusion to keep hgb > 7, plt >10k, no GSCF when patient gets XRT per Dr Lindi Adie -d/c prophylaxic lovenox, start scd's  presumed UTI, with h/o chronic foley  -Patient is afebrile -she does reports urine was cloudy at home -Urine culture +ESBL -d/c IV Rocephin, change to meropenem ( she is immunosuppressed and has neutropenia), foley changed on 6/23 -plan to continue meropenem until neutropenia improves , urine is clear today   essential hypertension/short run of SVT on tele -d/c norvasc, change to lopressor, keek K> 4, mag>2 -continue lasix    anxiety/depression -Effexor changed to cymbalta for added neuropathic pain benefit - needed Xanax. -Mood overall stable -No suicidal ideation or hallucinations.  GERD -Continue PPI  physical deconditioning and generalized weakness -Physical therapy has evaluated patient and has recommended skilled nursing facility for rehabilitation once medically stable.  Body mass index is 34.3 kg/m.   Overall poor prognosis   SARS-COV 2 screening negative  DVT prophylaxis: scd's  Code Status: DNR Family Communication: No family at bedside; patient expressed that she will update them personally. Disposition Plan: not ready to discharge, needs improvement in neutropenic and thrombocytopenia,  will need SNF once medically stable  Consultants:   Hematology Oncology   Radiation oncology  Procedures:   XRT  Antimicrobials:  Anti-infectives (From admission, onward)   Start     Dose/Rate Route Frequency Ordered Stop   04/12/19 1100  meropenem (MERREM) 1 g  in  sodium chloride 0.9 % 100 mL IVPB     1 g 200 mL/hr over 30 Minutes Intravenous Every 8 hours 04/12/19 1013     04/10/19 0130  cefTRIAXone (ROCEPHIN) 1 g in sodium chloride 0.9 % 100 mL IVPB  Status:  Discontinued     1 g 200 mL/hr over 30 Minutes Intravenous Every 24 hours 04/10/19 0125 04/12/19 1013   04/09/19 1945  cephALEXin (KEFLEX) capsule 500 mg     500 mg Oral  Once 04/09/19 1941 04/09/19 1945       Subjective:  She is not in acute distress,  Reports legs weakness did not improved with XRT treatment, c/o tingling in both legs  Neutropenic and thrombocytopenia  getting worse, No fever, good urine output with clear urine in foley No sign of bleeding  Objective: Vitals:   04/13/19 0452 04/13/19 1341 04/13/19 2016 04/14/19 0522  BP: (!) 149/85 (!) 153/84 (!) 134/95 (!) 155/89  Pulse: 99 (!) 102 83 100  Resp: 20  20 18   Temp: 98.1 F (36.7 C) 98 F (36.7 C) 98.2 F (36.8 C) 98 F (36.7 C)  TempSrc: Oral Oral Oral Oral  SpO2: 96% 98% 96% 98%  Weight: 96.5 kg   96.4 kg  Height:        Intake/Output Summary (Last 24 hours) at 04/14/2019 0833 Last data filed at 04/14/2019 0524 Gross per 24 hour  Intake --  Output 4375 ml  Net -4375 ml   Filed Weights   04/11/19 0507 04/13/19 0452 04/14/19 0522  Weight: 97.4 kg 96.5 kg 96.4 kg    Examination: General exam: Alert, awake, oriented x 3, continues to be severely weak and deconditioned; No fever, no chest pain, no nausea or vomiting.  Poor balance demonstrated during examination by PT.  Right ptosis appreciated on exam. Respiratory system: Clear to auscultation. Respiratory effort normal. Cardiovascular system:RRR. No murmurs, rubs, gallops. Gastrointestinal system: Abdomen is nondistended, soft and nontender. No organomegaly or masses felt. Normal bowel sounds heard. Central nervous system: Alert and oriented.  No pronation drift; patient with 2-3/5 lower extremity weakness.  Extremities: No cyanosis or clubbing.   Trace edema bilaterally. Skin: No rashes, lesions or ulcers Psychiatry: Judgement and insight appear normal. Mood & affect appropriate.     Data Reviewed: I have personally reviewed following labs and imaging studies  CBC: Recent Labs  Lab 04/09/19 1700 04/10/19 0508 04/13/19 0536  WBC 1.3* 1.3* 1.0*  NEUTROABS 0.9*  --  0.8*  HGB 9.8* 8.8* 8.9*  HCT 31.9* 28.6* 29.2*  MCV 83.1 82.4 83.4  PLT 57* 43* 20*   Basic Metabolic Panel: Recent Labs  Lab 04/09/19 1700 04/10/19 0508 04/13/19 0536 04/14/19 0552  NA 137 140 140 140  K 4.0 3.9 4.1 4.3  CL 99 104 107 106  CO2 24 24 23  21*  GLUCOSE 117* 129* 107* 110*  BUN 21* 18 22* 24*  CREATININE 0.49 0.42* 0.36* <0.30*  CALCIUM 9.4 9.1 8.8* 8.7*  MG  --   --  2.2  --    GFR: CrCl cannot be calculated (This lab value cannot be used to calculate CrCl because it is not a number: <0.30).   Liver Function Tests: Recent Labs  Lab 04/09/19 1700 04/10/19 0508 04/13/19 0536 04/14/19 0552  AST 67* 62* 50* 44*  ALT 73* 67* 74* 69*  ALKPHOS 232* 236* 188* 178*  BILITOT 1.3* 0.7 0.5 0.6  PROT 6.7 6.1* 6.1* 5.9*  ALBUMIN 2.9* 2.5* 2.6* 2.6*  Coagulation Profile: Recent Labs  Lab 04/09/19 2354  INR 1.1    Recent Labs  Lab 04/09/19 1659  GLUCAP 103*   Urine analysis:    Component Value Date/Time   COLORURINE YELLOW 04/09/2019 1815   APPEARANCEUR HAZY (A) 04/09/2019 1815   LABSPEC 1.017 04/09/2019 1815   PHURINE 6.0 04/09/2019 1815   GLUCOSEU NEGATIVE 04/09/2019 1815   HGBUR SMALL (A) 04/09/2019 1815   BILIRUBINUR NEGATIVE 04/09/2019 1815   KETONESUR 5 (A) 04/09/2019 1815   PROTEINUR NEGATIVE 04/09/2019 1815   NITRITE NEGATIVE 04/09/2019 1815   LEUKOCYTESUR TRACE (A) 04/09/2019 1815    Recent Results (from the past 240 hour(s))  Urine Culture     Status: Abnormal   Collection Time: 04/09/19  6:15 PM   Specimen: Urine, Catheterized  Result Value Ref Range Status   Specimen Description   Final    URINE,  CATHETERIZED Performed at [facility], [address] [place], [place] [address]    Special Requests   Final    Normal Performed at [facility], [address], [place], [place] [address]    Culture (A)  Final    80,000 COLONIES/mL ESCHERICHIA COLI Confirmed Extended Spectrum Beta-Lactamase Producer (ESBL).  In bloodstream infections from ESBL organisms, carbapenems are preferred over piperacillin/tazobactam. They are shown to have a lower risk of mortality.    Report Status 04/12/2019 FINAL  Final   Organism ID, Bacteria ESCHERICHIA COLI (A)  Final      Susceptibility   Escherichia coli - MIC*    AMPICILLIN >=32 RESISTANT Resistant     CEFAZOLIN >=64 RESISTANT Resistant     CEFTRIAXONE >=64 RESISTANT Resistant     CIPROFLOXACIN >=4 RESISTANT Resistant     GENTAMICIN >=16 RESISTANT Resistant     IMIPENEM <=0.25 SENSITIVE Sensitive     NITROFURANTOIN <=16 SENSITIVE Sensitive     TRIMETH/SULFA >=320 RESISTANT Resistant     AMPICILLIN/SULBACTAM >=32 RESISTANT Resistant     PIP/TAZO 8 SENSITIVE Sensitive     Extended ESBL POSITIVE Resistant     * 80,000 COLONIES/mL ESCHERICHIA COLI  Novel Coronavirus,NAA,(SEND-OUT TO REF LAB - TAT 24-48 hrs); Hosp Order     Status: None   Collection Time: 04/10/19  1:56 AM   Specimen: Nasopharyngeal Swab; Respiratory  Result Value Ref Range Status   SARS-CoV-2, NAA NOT DETECTED NOT DETECTED Final    Comment: (NOTE) This test was developed and its performance characteristics determined by [employer]. This test has not been FDA cleared or approved. This test has been authorized by FDA under an Emergency Use Authorization (EUA). This test is only authorized for the duration of time the declaration that circumstances exist justifying the authorization of the emergency use of in vitro diagnostic tests for detection of SARS-CoV-2 virus and/or diagnosis of COVID-19 infection under section 564(b)(1) of the Act, 21 U.S.C. 998PJA-2(N)(0), unless  the authorization is terminated or revoked sooner. When diagnostic testing is negative, the possibility of a false negative result should be considered in the context of a patient's recent exposures and the presence of clinical signs and symptoms consistent with COVID-19. An individual without symptoms of COVID-19 and who is not shedding SARS-CoV-2 virus would expect to have a negative (not detected) result in this assay. Performed  At: [facility] [address] [place], [place] [id number] Rush Farmer MD PF:[id number]    [place]  Final    Comment: Performed at [facility], [address] [place], [place] [address]    Radiology  Studies: Vas [place] Lower Extremity Venous (dvt)  Result Date: 04/13/2019  Lower Venous Study Indications: Edema.  Risk Factors: Cancer. Comparison Study: 03/29/19 - Negative for DVT Performing Technologist: Oliver Hum RVT  Examination Guidelines: A complete evaluation includes B-mode imaging, spectral Doppler, color Doppler, and power Doppler as needed of all accessible portions of each vessel. Bilateral testing is considered an integral part of a complete examination. Limited examinations for reoccurring indications may be performed as noted.  +---------+---------------+---------+-----------+----------+-------+  RIGHT     Compressibility Phasicity Spontaneity Properties Summary  +---------+---------------+---------+-----------+----------+-------+  CFV       Full            Yes       Yes                             +---------+---------------+---------+-----------+----------+-------+  SFJ       Full                                                      +---------+---------------+---------+-----------+----------+-------+  FV Prox   Full                                                      +---------+---------------+---------+-----------+----------+-------+  FV Mid    Full                                                       +---------+---------------+---------+-----------+----------+-------+  FV Distal Full                                                      +---------+---------------+---------+-----------+----------+-------+  PFV       Full                                                      +---------+---------------+---------+-----------+----------+-------+  POP       Full            Yes       Yes                             +---------+---------------+---------+-----------+----------+-------+  PTV       Full                                                      +---------+---------------+---------+-----------+----------+-------+  PERO      Full                                                      +---------+---------------+---------+-----------+----------+-------+   +---------+---------------+---------+-----------+----------+-------+  LEFT      Compressibility Phasicity Spontaneity Properties Summary  +---------+---------------+---------+-----------+----------+-------+  CFV       Full            Yes       Yes                             +---------+---------------+---------+-----------+----------+-------+  SFJ       Full                                                      +---------+---------------+---------+-----------+----------+-------+  FV Prox   Full                                                      +---------+---------------+---------+-----------+----------+-------+  FV Mid    Full                                                      +---------+---------------+---------+-----------+----------+-------+  FV Distal Full                                                      +---------+---------------+---------+-----------+----------+-------+  PFV       Full                                                      +---------+---------------+---------+-----------+----------+-------+  POP       Full            Yes       Yes                             +---------+---------------+---------+-----------+----------+-------+  PTV       Full                                                       +---------+---------------+---------+-----------+----------+-------+  PERO      Full                                                      +---------+---------------+---------+-----------+----------+-------+     Summary: Right: There is no evidence of deep vein thrombosis in the lower extremity. No cystic structure found in the popliteal fossa. Left: There is no evidence of deep vein thrombosis in the lower extremity. No cystic structure  found in the popliteal fossa.  *See table(s) above for measurements and observations.    Preliminary     Scheduled Meds:  dexamethasone  4 mg Oral Q6H   feeding supplement (PRO-STAT SUGAR FREE 64)  30 mL Oral BID   furosemide  20 mg Oral Daily   hydrocortisone   Rectal BID   metoprolol tartrate  12.5 mg Oral BID   pantoprazole  40 mg Oral Daily   senna-docusate  1 tablet Oral BID   venlafaxine XR  75 mg Oral QHS   Continuous Infusions:  meropenem (MERREM) IV 1 g (04/14/19 0311)     LOS: 4 days    Time spent: 30 minutes.    Florencia Reasons, MD PhD [facility] Pager [phone]   04/14/2019, 8:33 AM

## 2019-04-14 NOTE — Progress Notes (Signed)
HEMATOLOGY-ONCOLOGY PROGRESS NOTE  SUBJECTIVE: Labs unavailable today (problems with lab machine) Gen weakness, Requires Oxygen  OBJECTIVE: REVIEW OF SYSTEMS:   As above all other systems neg PHYSICAL EXAMINATION: ECOG PERFORMANCE STATUS: 2 - Symptomatic, <50% confined to bed  Vitals:   04/14/19 0522 04/14/19 1019  BP: (!) 155/89 (!) 148/76  Pulse: 100 (!) 111  Resp: 18   Temp: 98 F (36.7 C)   SpO2: 98%    Filed Weights   04/11/19 0507 04/13/19 0452 04/14/19 0522  Weight: 214 lb 11.7 oz (97.4 kg) 212 lb 11.9 oz (96.5 kg) 212 lb 8.4 oz (96.4 kg)   Exam: Not done  LABORATORY DATA:  I have reviewed the data as listed CMP Latest Ref Rng & Units 04/14/2019 04/13/2019 04/10/2019  Glucose 70 - 99 mg/dL 110(H) 107(H) 129(H)  BUN 6 - 20 mg/dL 24(H) 22(H) 18  Creatinine 0.44 - 1.00 mg/dL <0.30(L) 0.36(L) 0.42(L)  Sodium 135 - 145 mmol/L 140 140 140  Potassium 3.5 - 5.1 mmol/L 4.3 4.1 3.9  Chloride 98 - 111 mmol/L 106 107 104  CO2 22 - 32 mmol/L 21(L) 23 24  Calcium 8.9 - 10.3 mg/dL 8.7(L) 8.8(L) 9.1  Total Protein 6.5 - 8.1 g/dL 5.9(L) 6.1(L) 6.1(L)  Total Bilirubin 0.3 - 1.2 mg/dL 0.6 0.5 0.7  Alkaline Phos 38 - 126 U/L 178(H) 188(H) 236(H)  AST 15 - 41 U/L 44(H) 50(H) 62(H)  ALT 0 - 44 U/L 69(H) 74(H) 67(H)    Lab Results  Component Value Date   WBC 1.0 (LL) 04/13/2019   HGB 8.9 (L) 04/13/2019   HCT 29.2 (L) 04/13/2019   MCV 83.4 04/13/2019   PLT 20 (LL) 04/13/2019   NEUTROABS 0.8 (L) 04/13/2019    ASSESSMENT AND PLAN: 1. Pancytopenia: Labs unavailable today, Cause: XRT (labs were normal 2 weeks ago) 2. Met Neuroendocrine tumor: Follows Dr.Katragadda and Dr.Manning Will D/w Dr.Manning tomorrow

## 2019-04-14 NOTE — Progress Notes (Signed)
Dr. Erlinda Hong text critical value WBC 1.02 and Platelets-15

## 2019-04-15 ENCOUNTER — Ambulatory Visit: Payer: BC Managed Care – PPO

## 2019-04-15 LAB — CBC WITH DIFFERENTIAL/PLATELET
Abs Immature Granulocytes: 0.08 10*3/uL — ABNORMAL HIGH (ref 0.00–0.07)
Basophils Absolute: 0 10*3/uL (ref 0.0–0.1)
Basophils Relative: 1 %
Eosinophils Absolute: 0 10*3/uL (ref 0.0–0.5)
Eosinophils Relative: 0 %
HCT: 27.4 % — ABNORMAL LOW (ref 36.0–46.0)
Hemoglobin: 8.5 g/dL — ABNORMAL LOW (ref 12.0–15.0)
Immature Granulocytes: 10 %
Lymphocytes Relative: 11 %
Lymphs Abs: 0.1 10*3/uL — ABNORMAL LOW (ref 0.7–4.0)
MCH: 25.3 pg — ABNORMAL LOW (ref 26.0–34.0)
MCHC: 31 g/dL (ref 30.0–36.0)
MCV: 81.5 fL (ref 80.0–100.0)
Monocytes Absolute: 0.1 10*3/uL (ref 0.1–1.0)
Monocytes Relative: 6 %
Neutro Abs: 0.6 10*3/uL — ABNORMAL LOW (ref 1.7–7.7)
Neutrophils Relative %: 72 %
Platelets: 11 10*3/uL — CL (ref 150–400)
RBC: 3.36 MIL/uL — ABNORMAL LOW (ref 3.87–5.11)
RDW: 14.1 % (ref 11.5–15.5)
WBC: 0.8 10*3/uL — CL (ref 4.0–10.5)
nRBC: 2.4 % — ABNORMAL HIGH (ref 0.0–0.2)

## 2019-04-15 LAB — COMPREHENSIVE METABOLIC PANEL
ALT: 68 U/L — ABNORMAL HIGH (ref 0–44)
AST: 41 U/L (ref 15–41)
Albumin: 2.6 g/dL — ABNORMAL LOW (ref 3.5–5.0)
Alkaline Phosphatase: 185 U/L — ABNORMAL HIGH (ref 38–126)
Anion gap: 10 (ref 5–15)
BUN: 24 mg/dL — ABNORMAL HIGH (ref 6–20)
CO2: 23 mmol/L (ref 22–32)
Calcium: 8.7 mg/dL — ABNORMAL LOW (ref 8.9–10.3)
Chloride: 107 mmol/L (ref 98–111)
Creatinine, Ser: <0.3 mg/dL — ABNORMAL LOW (ref 0.44–1.00)
Glucose, Bld: 113 mg/dL — ABNORMAL HIGH (ref 70–99)
Potassium: 4.2 mmol/L (ref 3.5–5.1)
Sodium: 140 mmol/L (ref 135–145)
Total Bilirubin: 0.5 mg/dL (ref 0.3–1.2)
Total Protein: 6 g/dL — ABNORMAL LOW (ref 6.5–8.1)

## 2019-04-15 LAB — MAGNESIUM: Magnesium: 2.3 mg/dL (ref 1.7–2.4)

## 2019-04-15 NOTE — Progress Notes (Signed)
Physical Therapy Treatment Patient Details Name: Brandi Rodriguez MRN: [id number] DOB: Mar 22, 1961 Today's Date: 04/15/2019    History of Present Illness Brandi Rodriguez  is a 58 y.o. female,  58 y.o. female with medical history significant for metastatic lung cancer who was sent to the [facility] ED from the cancer center 03/27/19 with reports of falls and lower extremity weakness. She reported lower extremity weakness left worse than right that started one day PTA with pain in her back also. One week prior she noticed that her right eyelid drooped.    PT Comments    Pt defers OOB or EOB activity this session, agreeable to bed-level exercises. Pt continues to present with significant LE weakness R>L with paresthesias. Pt is able to participate in LE active movement with mod verbal encouragement. Pt educated on the importance of continuing to move in bed, including rolling L and R at least 1X every 2 hours, as a preventative measure for skin breakdown and to keep strength. Pt agrees. PT to continue to follow acutely.    Follow Up Recommendations  SNF;Supervision for mobility/OOB;Supervision/Assistance - 24 hour     Equipment Recommendations  None recommended by PT    Recommendations for Other Services       Precautions / Restrictions Precautions Precautions: Fall Precaution Comments: labs low Restrictions Weight Bearing Restrictions: No    Mobility  Bed Mobility Overal bed mobility: Needs Assistance Bed Mobility: Rolling Rolling: Mod assist         General bed mobility comments: Mod assist for rolling bilaterally for placement of fresh bed pads, requires assist with bed pad use and railings to roll bilat.  Transfers                 General transfer comment: not performed, pt defers EOB  Ambulation/Gait             General Gait Details: not performed   Stairs             Wheelchair Mobility    Modified Rankin (Stroke Patients Only)        Balance                                            Cognition Arousal/Alertness: Awake/alert Behavior During Therapy: WFL for tasks assessed/performed Overall Cognitive Status: No family/caregiver present to determine baseline cognitive functioning                                        Exercises General Exercises - Lower Extremity Ankle Circles/Pumps: Both;PROM;20 reps;Supine Quad Sets: AROM;Both;10 reps;Supine Heel Slides: AAROM;Both;Supine;15 reps Hip ABduction/ADduction: Both;AAROM;15 reps;Supine Straight Leg Raises: AAROM;10 reps;Both;Supine    General Comments        Pertinent Vitals/Pain Pain Assessment: 0-10 Pain Score: 7  Pain Location: bil LEs Pain Descriptors / Indicators: Cramping;Sore Pain Intervention(s): Limited activity within patient's tolerance;Monitored during session;Repositioned    Home Living                      Prior Function            PT Goals (current goals can now be found in the care plan section) Acute Rehab PT Goals Patient Stated Goal: return home after rehab PT Goal Formulation: With patient Time For  Goal Achievement: 04/24/19 Potential to Achieve Goals: Fair Progress towards PT goals: Progressing toward goals    Frequency    Min 3X/week      PT Plan Current plan remains appropriate    Co-evaluation              AM-PAC PT "6 Clicks" Mobility   Outcome Measure  Help needed turning from your back to your side while in a flat bed without using bedrails?: A Lot Help needed moving from lying on your back to sitting on the side of a flat bed without using bedrails?: A Lot Help needed moving to and from a bed to a chair (including a wheelchair)?: A Lot Help needed standing up from a chair using your arms (e.g., wheelchair or bedside chair)?: Total Help needed to walk in hospital room?: Total Help needed climbing 3-5 steps with a railing? : Total 6 Click Score: 9    End of  Session   Activity Tolerance: Patient limited by fatigue;Patient limited by pain Patient left: in bed;with call bell/phone within reach;with bed alarm set;with nursing/sitter in room(RN/NT in room at EOS) Nurse Communication: Mobility status PT Visit Diagnosis: Unsteadiness on feet (R26.81);History of falling (Z91.81);Other abnormalities of gait and mobility (R26.89)     Time: 9407-6808 PT Time Calculation (min) (ACUTE ONLY): 19 min  Charges:  $Therapeutic Exercise: 8-22 mins                     Ziyah Cordoba Conception Chancy, PT Acute Rehabilitation Services Pager [phone]  Office [phone]   Letti Towell D Elonda Husky 04/15/2019, 3:31 PM

## 2019-04-15 NOTE — TOC Progression Note (Signed)
Transition of Care [facility]) - Progression Note    Patient Details  Name: Brandi Rodriguez MRN: [id number] Date of Birth: [birth year]  [facility] CM/SW Contact  Servando Snare, [place] Phone Number: 04/15/2019, 2:25 PM  Clinical Narrative:   LCSW met at bedside with patient to discuss bed offers. Patient requested that LCSW speak with spouse, Brandi Rodriguez about bed offers and bed choice. LCSW left message for spouse. Awaiting response.     Expected Discharge Plan: Skilled Nursing Facility Barriers to Discharge: Insurance Authorization  Expected Discharge Plan and Services Expected Discharge Plan: [place]       Living arrangements for the past 2 months: Single Family Home                                       Social Determinants of Health (SDOH) Interventions    Readmission Risk Interventions No flowsheet data found.

## 2019-04-15 NOTE — Progress Notes (Signed)
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  Who presented with worsening generalized weakness and inability to walk or transfer.  Patient contacted oncology service who has recommended return to emergency department for further evaluation. Imaging studies demonstrated decrease epidural tumor at T9 and T12-L1 without spinal stenosis.  But there is a findings of progressive large epidural tumor from L4-S1 and severe spinal stenosis.  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  Minimal associated mass-effect appreciated.  Case discussed by Dr. Delton Coombes with Dr. Tammi Klippel (radiation oncologist) and has recommended transfer to [facility] for initiation of radiation treatments for lumbar spine and also the brain.   04/15/2019: Oncology team is directing care.  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 -she reports tingling in both legs, will change effexor  To cymbalta for neuropathic pain control 04/15/2019: No  significant changes.  Further management as per oncology team.  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  Repeat urine culture on antibiotics is completed.   essential hypertension/short run of SVT on tele -d/c norvasc, change to lopressor, keek K> 4, mag>2 -continue lasix 04/15/2019: Blood pressure is controlled.  Very mild tachycardia is noted.  anxiety/depression -Effexor changed to cymbalta for added neuropathic pain benefit - needed Xanax. -Mood overall stable -No suicidal ideation or hallucinations.  GERD -Continue PPI  physical deconditioning and generalized weakness -Physical therapy has evaluated patient and has recommended skilled nursing facility for rehabilitation once medically stable.  Body mass index is 32.59 kg/m.   Overall poor prognosis   SARS-COV 2 screening negative  DVT prophylaxis: scd's  Code Status: DNR Family Communication:  Disposition Plan: Likely SNF.  However, this will depend on hospital course.  Consultants:   Hematology Oncology   Radiation oncology  Procedures:    XRT  Antimicrobials:  Anti-infectives (From admission, onward)  Start     Dose/Rate Route Frequency Ordered Stop   04/12/19 1100  meropenem (MERREM) 1 g in sodium chloride 0.9 % 100 mL IVPB     1 g 200 mL/hr over 30 Minutes Intravenous Every 8 hours 04/12/19 1013     04/10/19 0130  cefTRIAXone (ROCEPHIN) 1 g in sodium chloride 0.9 % 100 mL IVPB  Status:  Discontinued     1 g 200 mL/hr over 30 Minutes Intravenous Every 24 hours 04/10/19 0125 04/12/19 1013   04/09/19 1945  cephALEXin (KEFLEX) capsule 500 mg     500 mg Oral  Once 04/09/19 1941 04/09/19 1945       Subjective: No new complaints.   Continues to report back pain and leg pain.   No fever or chills.    Objective: Vitals:   04/14/19 2147 04/15/19 0500 04/15/19 0600 04/15/19 1359  BP: (!) 156/88  139/84 131/87  Pulse: 95  98 (!) 102  Resp: 20  16 18   Temp: 97.9 F (36.6 C)  97.9 F (36.6 C) 98.3 F (36.8 C)  TempSrc: Oral  Oral Oral  SpO2: 98%  98% 98%  Weight:  91.6 kg 91.6 kg   Height:        Intake/Output Summary (Last 24 hours) at 04/15/2019 1804 Last data filed at 04/15/2019 1530 Gross per 24 hour  Intake 50 ml  Output 1450 ml  Net -1400 ml   Filed Weights   04/14/19 0522 04/15/19 0500 04/15/19 0600  Weight: 96.4 kg 91.6 kg 91.6 kg    Examination: General exam: Obese.  Alert, awake, oriented x 3,  Respiratory system: Clear to auscultation anteriorly. Cardiovascular system: S1-S2. Gastrointestinal system: Abdomen is obese, soft and nontender.  Organs are difficult to assess.   Central nervous system: Alert and oriented.  Right leg drop.  Good strength both upper extremities.  Power lower extremities difficult to assess. Extremities: Mild edema lower extremities.  Data Reviewed: I have personally reviewed following labs and imaging studies  CBC: Recent Labs  Lab 04/09/19 1700 04/10/19 0508 04/13/19 0536 04/14/19 0552 04/15/19 0557  WBC 1.3* 1.3* 1.0* 1.0* 0.8*  NEUTROABS 0.9*  --  0.8* 0.8*  0.6*  HGB 9.8* 8.8* 8.9* 8.5* 8.5*  HCT 31.9* 28.6* 29.2* 27.4* 27.4*  MCV 83.1 82.4 83.4 82.5 81.5  PLT 57* 43* 20* 15* 11*   Basic Metabolic Panel: Recent Labs  Lab 04/09/19 1700 04/10/19 0508 04/13/19 0536 04/14/19 0552 04/15/19 0557  NA 137 140 140 140 140  K 4.0 3.9 4.1 4.3 4.2  CL 99 104 107 106 107  CO2 24 24 23  21* 23  GLUCOSE 117* 129* 107* 110* 113*  BUN 21* 18 22* 24* 24*  CREATININE 0.49 0.42* 0.36* <0.30* <0.30*  CALCIUM 9.4 9.1 8.8* 8.7* 8.7*  MG  --   --  2.2  --  2.3   GFR: CrCl cannot be calculated (This lab value cannot be used to calculate CrCl because it is not a number: <0.30).   Liver Function Tests: Recent Labs  Lab 04/09/19 1700 04/10/19 0508 04/13/19 0536 04/14/19 0552 04/15/19 0557  AST 67* 62* 50* 44* 41  ALT 73* 67* 74* 69* 68*  ALKPHOS 232* 236* 188* 178* 185*  BILITOT 1.3* 0.7 0.5 0.6 0.5  PROT 6.7 6.1* 6.1* 5.9* 6.0*  ALBUMIN 2.9* 2.5* 2.6* 2.6* 2.6*   Coagulation Profile: Recent Labs  Lab 04/09/19 2354  INR 1.1    Recent Labs  Lab 04/09/19 1659  GLUCAP  103*   Urine analysis:    Component Value Date/Time   COLORURINE YELLOW 04/09/2019 1815   APPEARANCEUR HAZY (A) 04/09/2019 1815   LABSPEC 1.017 04/09/2019 1815   PHURINE 6.0 04/09/2019 1815   GLUCOSEU NEGATIVE 04/09/2019 1815   HGBUR SMALL (A) 04/09/2019 1815   BILIRUBINUR NEGATIVE 04/09/2019 1815   KETONESUR 5 (A) 04/09/2019 1815   PROTEINUR NEGATIVE 04/09/2019 1815   NITRITE NEGATIVE 04/09/2019 1815   LEUKOCYTESUR TRACE (A) 04/09/2019 1815    Recent Results (from the past 240 hour(s))  Urine Culture     Status: Abnormal   Collection Time: 04/09/19  6:15 PM   Specimen: Urine, Catheterized  Result Value Ref Range Status   Specimen Description   Final    URINE, CATHETERIZED Performed at [facility], [address] [place], [place] [address]    Special Requests   Final    Normal Performed at [facility], [address] [place], [place] [address]     Culture (A)  Final    80,000 COLONIES/mL ESCHERICHIA COLI Confirmed Extended Spectrum Beta-Lactamase Producer (ESBL).  In bloodstream infections from ESBL organisms, carbapenems are preferred over piperacillin/tazobactam. They are shown to have a lower risk of mortality.    Report Status 04/12/2019 FINAL  Final   Organism ID, Bacteria ESCHERICHIA COLI (A)  Final      Susceptibility   Escherichia coli - MIC*    AMPICILLIN >=32 RESISTANT Resistant     CEFAZOLIN >=64 RESISTANT Resistant     CEFTRIAXONE >=64 RESISTANT Resistant     CIPROFLOXACIN >=4 RESISTANT Resistant     GENTAMICIN >=16 RESISTANT Resistant     IMIPENEM <=0.25 SENSITIVE Sensitive     NITROFURANTOIN <=16 SENSITIVE Sensitive     TRIMETH/SULFA >=320 RESISTANT Resistant     AMPICILLIN/SULBACTAM >=32 RESISTANT Resistant     PIP/TAZO 8 SENSITIVE Sensitive     Extended ESBL POSITIVE Resistant     * 80,000 COLONIES/mL ESCHERICHIA COLI  Novel Coronavirus,NAA,(SEND-OUT TO REF LAB - TAT 24-48 hrs); Hosp Order     Status: None   Collection Time: 04/10/19  1:56 AM   Specimen: Nasopharyngeal Swab; Respiratory  Result Value Ref Range Status   SARS-CoV-2, NAA NOT DETECTED NOT DETECTED Final    Comment: (NOTE) This test was developed and its performance characteristics determined by [employer]. This test has not been FDA cleared or approved. This test has been authorized by FDA under an Emergency Use Authorization (EUA). This test is only authorized for the duration of time the declaration that circumstances exist justifying the authorization of the emergency use of in vitro diagnostic tests for detection of SARS-CoV-2 virus and/or diagnosis of COVID-19 infection under section 564(b)(1) of the Act, 21 U.S.C. 779TJQ-3(E)(0), unless the authorization is terminated or revoked sooner. When diagnostic testing is negative, the possibility of a false negative result should be considered in the context of a patient's recent  exposures and the presence of clinical signs and symptoms consistent with COVID-19. An individual without symptoms of COVID-19 and who is not shedding SARS-CoV-2 virus would expect to have a negative (not detected) result in this assay. Performed  At: [facility] [place], [place] [id number] Rush Farmer MD UQ:[id number]    [place]  Final    Comment: Performed at [facility], [address] [place], [place] [address]    Radiology Studies: No results found.  Scheduled Meds: . dexamethasone  4 mg Oral Q6H  . DULoxetine  20 mg Oral BID  .  feeding supplement (PRO-STAT SUGAR FREE 64)  30 mL Oral BID  . furosemide  20 mg Oral Daily  . hydrocortisone   Rectal BID  . metoprolol tartrate  12.5 mg Oral BID  . pantoprazole  40 mg Oral Daily  . senna-docusate  1 tablet Oral BID   Continuous Infusions: . meropenem (MERREM) IV 1 g (04/15/19 1125)     LOS: 5 days    Time spent: 30 minutes.    Bonnell Public, MD  [facility]   04/15/2019, 6:04 PM

## 2019-04-16 ENCOUNTER — Ambulatory Visit: Payer: BC Managed Care – PPO

## 2019-04-16 LAB — CBC WITH DIFFERENTIAL/PLATELET
Abs Immature Granulocytes: 0.04 10*3/uL (ref 0.00–0.07)
Abs Immature Granulocytes: 0.04 10*3/uL (ref 0.00–0.07)
Basophils Absolute: 0 10*3/uL (ref 0.0–0.1)
Basophils Absolute: 0 10*3/uL (ref 0.0–0.1)
Basophils Relative: 2 %
Basophils Relative: 2 %
Eosinophils Absolute: 0 10*3/uL (ref 0.0–0.5)
Eosinophils Absolute: 0 10*3/uL (ref 0.0–0.5)
Eosinophils Relative: 0 %
Eosinophils Relative: 2 %
HCT: 27.2 % — ABNORMAL LOW (ref 36.0–46.0)
HCT: 24.5 % — ABNORMAL LOW (ref 36.0–46.0)
Hemoglobin: 8.4 g/dL — ABNORMAL LOW (ref 12.0–15.0)
Hemoglobin: 7.8 g/dL — ABNORMAL LOW (ref 12.0–15.0)
Immature Granulocytes: 7 %
Immature Granulocytes: 8 %
Lymphocytes Relative: 18 %
Lymphocytes Relative: 28 %
Lymphs Abs: 0.1 10*3/uL — ABNORMAL LOW (ref 0.7–4.0)
Lymphs Abs: 0.2 10*3/uL — ABNORMAL LOW (ref 0.7–4.0)
MCH: 25.1 pg — ABNORMAL LOW (ref 26.0–34.0)
MCH: 25.8 pg — ABNORMAL LOW (ref 26.0–34.0)
MCHC: 30.9 g/dL (ref 30.0–36.0)
MCHC: 31.8 g/dL (ref 30.0–36.0)
MCV: 81.2 fL (ref 80.0–100.0)
MCV: 81.1 fL (ref 80.0–100.0)
Monocytes Absolute: 0.1 10*3/uL (ref 0.1–1.0)
Monocytes Absolute: 0 10*3/uL — ABNORMAL LOW (ref 0.1–1.0)
Monocytes Relative: 9 %
Monocytes Relative: 6 %
Neutro Abs: 0.4 10*3/uL — ABNORMAL LOW (ref 1.7–7.7)
Neutro Abs: 0.3 10*3/uL — ABNORMAL LOW (ref 1.7–7.7)
Neutrophils Relative %: 64 %
Neutrophils Relative %: 54 %
Platelets: 8 10*3/uL — CL (ref 150–400)
Platelets: 25 10*3/uL — CL (ref 150–400)
RBC: 3.35 MIL/uL — ABNORMAL LOW (ref 3.87–5.11)
RBC: 3.02 MIL/uL — ABNORMAL LOW (ref 3.87–5.11)
RDW: 13.7 % (ref 11.5–15.5)
RDW: 13.6 % (ref 11.5–15.5)
WBC: 0.6 10*3/uL — CL (ref 4.0–10.5)
WBC: 0.5 10*3/uL — CL (ref 4.0–10.5)
nRBC: 3.6 % — ABNORMAL HIGH (ref 0.0–0.2)
nRBC: 0 % (ref 0.0–0.2)

## 2019-04-16 LAB — ABO/RH: ABO/RH(D): A POS

## 2019-04-16 MED ORDER — TRAMADOL HCL 50 MG PO TABS
50.0000 mg | ORAL_TABLET | Freq: Four times a day (QID) | ORAL | Status: DC | PRN
  Administered 2019-04-16 – 2019-04-21 (×15): 100 mg via ORAL
  Administered 2019-04-21: 50 mg via ORAL
  Administered 2019-04-22 – 2019-04-26 (×11): 100 mg via ORAL
  Filled 2019-04-16 (×17): qty 2
  Filled 2019-04-16: qty 1
  Filled 2019-04-16 (×10): qty 2

## 2019-04-16 MED ORDER — MORPHINE SULFATE (PF) 2 MG/ML IV SOLN
1.0000 mg | INTRAVENOUS | Status: DC | PRN
  Administered 2019-04-16 – 2019-04-26 (×18): 1 mg via INTRAVENOUS
  Filled 2019-04-16 (×18): qty 1

## 2019-04-16 MED ORDER — SODIUM CHLORIDE 0.9% IV SOLUTION: Freq: Once | INTRAVENOUS | Status: DC

## 2019-04-16 NOTE — Progress Notes (Signed)
Pharmacy Antibiotic Note  Brandi Rodriguez is a 58 y.o. female admitted on 04/09/2019 with UTI that was ultimately found to be due to ESBL-producing E.Coli. Pharmacy was consulted for meropenem dosing. Patient was on Ceftriaxone 1g IV q24h from 6/24 - 6/26 before urine culture results were available.     Patient with significant past medical history of high-grade neuroendocrine carcinoma, currently receiving radiation treatment. Patient has been pancytopenic with low ANC, but afebrile. Per oncology the pancytopenia is likely related to radiation treatment.  Per hospitalist note the current plan is to continue abx until neutropenia improves   Today, 04/16/2019 : ANC 0.4 K Afebrile   Plan: Continue current meropenem dosage, 1g IV q8h Monitor renal function, CBC, and clinical progress  Height: 5\' 6"  (167.6 cm) Weight: 203 lb 14.8 oz (92.5 kg) IBW/kg (Calculated) : 59.3  Temp (24hrs), Avg:98.2 F (36.8 C), Min:98.1 F (36.7 C), Max:98.3 F (36.8 C)  Recent Labs  Lab 04/09/19 1700 04/10/19 0508 04/13/19 0536 04/14/19 0552 04/15/19 0557 04/16/19 0536  WBC 1.3* 1.3* 1.0* 1.0* 0.8* 0.6*  CREATININE 0.49 0.42* 0.36* <0.30* <0.30*  --     CrCl cannot be calculated (This lab value cannot be used to calculate CrCl because it is not a number: <0.30).    Allergies  Allergen Reactions  . Hydrocodone Other (See Comments)    Makes heart pound    Antimicrobials this admission: CTX 6/24 >> 6/26 Meropenem 6/26 >>   Dose adjustments this admission: N/A  Microbiology results: 6/23 UCx: 80 K ESBL(+) E. Coli (S to imipenem)    Thank you for allowing pharmacy to be a part of this patient's care.  Clayburn Pert, PharmD, BCPS [phone] 04/16/2019  7:44 AM

## 2019-04-16 NOTE — Progress Notes (Signed)
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  Who presented with worsening generalized weakness and inability to walk or transfer.  Patient contacted oncology service who has recommended return to emergency department for further evaluation. Imaging studies demonstrated decrease epidural tumor at T9 and T12-L1 without spinal stenosis.  But there is a findings of progressive large epidural tumor from L4-S1 and severe spinal stenosis.  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  Minimal associated mass-effect appreciated.  Case discussed by Dr. Delton Coombes with Dr. Tammi Klippel (radiation oncologist) and has recommended transfer to [facility] for initiation of radiation treatments for lumbar spine and also the brain.     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 -appreciated hematology/oncology Dr Lindi Adie recommendation, supportive transfusion to keep hgb > 7, plt >10k, no GSCF for dec WBC due to XRT per Dr Lindi Adie -will transfuse platelets as they are <10  presumed UTI, with h/o chronic foley  -Patient is afebrile -she does reports urine was cloudy at home -Urine culture +ESBL -d/c IV Rocephin, change to meropenem ( she is immunosuppressed and has neutropenia), foley changed on 6/23 -appears to be day 5 of merrem   essential hypertension/short run of SVT on tele -d/c norvasc, change to lopressor, keek K> 4, mag>2   anxiety/depression -Effexor changed to cymbalta for added neuropathic pain benefit  GERD -Continue PPI  physical deconditioning and generalized weakness -Physical therapy has evaluated patient and has recommended skilled nursing facility for rehabilitation once medically stable.  obesity Body mass index is 32.91 kg/m.    DVT prophylaxis: scd's  Code Status: DNR Family Communication:  Disposition Plan: SNF when medically stable  Consultants:   Hematology Oncology   Radiation oncology  Procedures:   XRT  Antimicrobials:  Anti-infectives (From admission, onward)   Start     Dose/Rate Route Frequency Ordered Stop   04/12/19 1100  meropenem (MERREM) 1 g in sodium chloride 0.9 % 100 mL IVPB     1 g 200 mL/hr over 30 Minutes Intravenous Every 8 hours 04/12/19 1013     04/10/19 0130  cefTRIAXone (ROCEPHIN) 1 g in sodium chloride 0.9 % 100 mL IVPB  Status:  Discontinued     1 g 200 mL/hr over 30 Minutes Intravenous Every 24 hours 04/10/19 0125 04/12/19 1013   04/09/19  1945  cephALEXin (KEFLEX) capsule 500 mg     500 mg Oral  Once 04/09/19 1941 04/09/19 1945       Subjective: Still c/o pain- legs mainly    Objective: Vitals:   04/15/19 1359 04/15/19 2015 04/16/19 0500 04/16/19 0542  BP: 131/87 (!) 151/85  (!) 154/92  Pulse: (!) 102 94  99  Resp: 18 16    Temp: 98.3 F (36.8 C) 98.1 F (36.7 C)  98.2 F (36.8 C)  TempSrc: Oral Oral    SpO2: 98% 98%  97%  Weight:   92.5 kg   Height:        Intake/Output Summary (Last 24 hours) at 04/16/2019 1214 Last data filed at 04/16/2019 0546 Gross per 24 hour  Intake 120 ml  Output 1325 ml  Net -1205 ml   Filed Weights   04/15/19 0500 04/15/19 0600 04/16/19 0500  Weight: 91.6 kg 91.6 kg 92.5 kg    Examination: Ill appearing, uncomfortable rrr No wheezing +BS, obese B/l leg weakness  Data Reviewed: I have personally reviewed following labs and imaging studies  CBC: Recent Labs  Lab 04/09/19 1700 04/10/19 0508 04/13/19 0536 04/14/19 0552 04/15/19 0557 04/16/19 0536  WBC 1.3* 1.3* 1.0* 1.0* 0.8* 0.6*  NEUTROABS 0.9*  --  0.8* 0.8* 0.6* 0.4*  HGB 9.8* 8.8* 8.9* 8.5* 8.5* 8.4*  HCT 31.9* 28.6* 29.2* 27.4* 27.4* 27.2*  MCV 83.1 82.4 83.4 82.5 81.5 81.2  PLT 57* 43* 20* 15* 11* 8*   Basic Metabolic Panel: Recent Labs  Lab 04/09/19 1700 04/10/19 0508 04/13/19 0536 04/14/19 0552 04/15/19 0557  NA 137 140 140 140 140  K 4.0 3.9 4.1 4.3 4.2  CL 99 104 107 106 107  CO2 24 24 23  21* 23  GLUCOSE 117* 129* 107* 110* 113*  BUN 21* 18 22* 24* 24*  CREATININE 0.49 0.42* 0.36* <0.30* <0.30*  CALCIUM 9.4 9.1 8.8* 8.7* 8.7*  MG  --   --  2.2  --  2.3   GFR: CrCl cannot be calculated (This lab value cannot be used to calculate CrCl because it is not a number: <0.30).   Liver Function Tests: Recent Labs  Lab 04/09/19 1700 04/10/19 0508 04/13/19 0536 04/14/19 0552 04/15/19 0557  AST 67* 62* 50* 44* 41  ALT 73* 67* 74* 69* 68*  ALKPHOS 232* 236* 188* 178* 185*  BILITOT 1.3* 0.7  0.5 0.6 0.5  PROT 6.7 6.1* 6.1* 5.9* 6.0*  ALBUMIN 2.9* 2.5* 2.6* 2.6* 2.6*   Coagulation Profile: Recent Labs  Lab 04/09/19 2354  INR 1.1    Recent Labs  Lab 04/09/19 1659  GLUCAP 103*   Urine analysis:    Component Value Date/Time   COLORURINE YELLOW 04/09/2019 1815   APPEARANCEUR HAZY (A) 04/09/2019 1815   LABSPEC 1.017 04/09/2019 1815   PHURINE 6.0 04/09/2019 1815   GLUCOSEU NEGATIVE 04/09/2019 1815   HGBUR SMALL (A) 04/09/2019 1815   BILIRUBINUR NEGATIVE 04/09/2019 1815   KETONESUR 5 (A) 04/09/2019 1815   PROTEINUR NEGATIVE 04/09/2019 1815   NITRITE NEGATIVE 04/09/2019 1815   LEUKOCYTESUR TRACE (A) 04/09/2019 1815    Recent Results (from the past 240 hour(s))  Urine Culture     Status: Abnormal   Collection Time: 04/09/19  6:15 PM   Specimen: Urine, Catheterized  Result Value Ref Range Status  Specimen Description   Final    URINE, CATHETERIZED Performed at [facility], [address] [place], [place] [address]    Special Requests   Final    Normal Performed at [facility], [address], [place], [place] [address]    Culture (A)  Final    80,000 COLONIES/mL ESCHERICHIA COLI Confirmed Extended Spectrum Beta-Lactamase Producer (ESBL).  In bloodstream infections from ESBL organisms, carbapenems are preferred over piperacillin/tazobactam. They are shown to have a lower risk of mortality.    Report Status 04/12/2019 FINAL  Final   Organism ID, Bacteria ESCHERICHIA COLI (A)  Final      Susceptibility   Escherichia coli - MIC*    AMPICILLIN >=32 RESISTANT Resistant     CEFAZOLIN >=64 RESISTANT Resistant     CEFTRIAXONE >=64 RESISTANT Resistant     CIPROFLOXACIN >=4 RESISTANT Resistant     GENTAMICIN >=16 RESISTANT Resistant     IMIPENEM <=0.25 SENSITIVE Sensitive     NITROFURANTOIN <=16 SENSITIVE Sensitive     TRIMETH/SULFA >=320 RESISTANT Resistant     AMPICILLIN/SULBACTAM >=32 RESISTANT Resistant     PIP/TAZO 8 SENSITIVE Sensitive     Extended ESBL  POSITIVE Resistant     * 80,000 COLONIES/mL ESCHERICHIA COLI  Novel Coronavirus,NAA,(SEND-OUT TO REF LAB - TAT 24-48 hrs); Hosp Order     Status: None   Collection Time: 04/10/19  1:56 AM   Specimen: Nasopharyngeal Swab; Respiratory  Result Value Ref Range Status   SARS-CoV-2, NAA NOT DETECTED NOT DETECTED Final    Comment: (NOTE) This test was developed and its performance characteristics determined by [employer]. This test has not been FDA cleared or approved. This test has been authorized by FDA under an Emergency Use Authorization (EUA). This test is only authorized for the duration of time the declaration that circumstances exist justifying the authorization of the emergency use of in vitro diagnostic tests for detection of SARS-CoV-2 virus and/or diagnosis of COVID-19 infection under section 564(b)(1) of the Act, 21 U.S.C. 361WER-1(V)(4), unless the authorization is terminated or revoked sooner. When diagnostic testing is negative, the possibility of a false negative result should be considered in the context of a patient's recent exposures and the presence of clinical signs and symptoms consistent with COVID-19. An individual without symptoms of COVID-19 and who is not shedding SARS-CoV-2 virus would expect to have a negative (not detected) result in this assay. Performed  At: [facility] [place], [place] [id number] Rush Farmer MD KD:[id number]    [place]  Final    Comment: Performed at [facility], [address], [place], [place] [address]    Radiology Studies: No results found.  Scheduled Meds: . sodium chloride   Intravenous Once  . dexamethasone  4 mg Oral Q6H  . DULoxetine  20 mg Oral BID  . feeding supplement (PRO-STAT SUGAR FREE 64)  30 mL Oral BID  . furosemide  20 mg Oral Daily  . hydrocortisone   Rectal BID  . metoprolol tartrate  12.5 mg Oral BID  . pantoprazole  40 mg Oral Daily  .  senna-docusate  1 tablet Oral BID   Continuous Infusions: . meropenem (MERREM) IV 1 g (04/16/19 1120)     LOS: 6 days       Geradine Girt, DO [facility]   04/16/2019, 12:14 PM

## 2019-04-17 ENCOUNTER — Encounter (HOSPITAL_COMMUNITY): Payer: Self-pay | Admitting: Hematology

## 2019-04-17 ENCOUNTER — Ambulatory Visit: Payer: BC Managed Care – PPO

## 2019-04-17 LAB — BPAM PLATELET PHERESIS
Blood Product Expiration Date: 202007032359
ISSUE DATE / TIME: 202006301643
Unit Type and Rh: 8400

## 2019-04-17 LAB — PREPARE PLATELET PHERESIS: Unit division: 0

## 2019-04-17 NOTE — Progress Notes (Signed)
Occupational Therapy Treatment Patient Details Name: Brandi Rodriguez MRN: [id number] DOB: [birth year] Today's Date: 04/17/2019    History of present illness Brandi Rodriguez  is a 58 y.o. female,  58 y.o. female with medical history significant for metastatic lung cancer who was sent to the [facility] ED from the cancer center 03/27/19 with reports of falls and lower extremity weakness. She reported lower extremity weakness left worse than right that started one day PTA with pain in her back also. One week prior she noticed that her right eyelid drooped.   OT comments  Assisted pt with rolling to get off bedpan and for hygiene.  Performed AAROM; pt's UEs weaker today.  Pt did not want to attempt sitting EOB today; it has been many days. Will continue to follow as pt tolerates  Follow Up Recommendations  SNF    Equipment Recommendations  None recommended by OT    Recommendations for Other Services      Precautions / Restrictions Precautions Precautions: Fall Precaution Comments: check labs Restrictions Weight Bearing Restrictions: No       Mobility Bed Mobility     Rolling: Mod assist;Max assist         General bed mobility comments: mod to L; max to R  Transfers                      Balance                                           ADL either performed or assessed with clinical judgement   ADL                               Toileting- Clothing Manipulation and Hygiene: Total assistance;Bed level         General ADL Comments: Pt was on bed pan; mod A to roll to L and max to R using chux to assist with log rolling.  Pt did not want to sit EOB today.  Platelets 25 today, bruising noted at L ankle     Vision       Perception     Praxis      Cognition Arousal/Alertness: Awake/alert Behavior During Therapy: WFL for tasks assessed/performed Overall Cognitive Status: No family/caregiver present to determine baseline  cognitive functioning                                 General Comments: follows commands; PLOF vague        Exercises Other Exercises Other Exercises: 10 reps AAROM FF and abduction   Shoulder Instructions       General Comments      Pertinent Vitals/ Pain       Pain Assessment: Faces Faces Pain Scale: Hurts whole lot Pain Location: bil LEs Pain Descriptors / Indicators: Cramping;Sore Pain Intervention(s): Limited activity within patient's tolerance;Monitored during session;Repositioned  Home Living                                          Prior Functioning/Environment              Frequency  Progress Toward Goals  OT Goals(current goals can now be found in the care plan section)  Progress towards OT goals: Not progressing toward goals - comment;OT to reassess next treatment(weaker today)     Plan      Co-evaluation                 AM-PAC OT "6 Clicks" Daily Activity     Outcome Measure   Help from another person eating meals?: A Little Help from another person taking care of personal grooming?: A Little Help from another person toileting, which includes using toliet, bedpan, or urinal?: Total Help from another person bathing (including washing, rinsing, drying)?: A Lot Help from another person to put on and taking off regular upper body clothing?: A Lot Help from another person to put on and taking off regular lower body clothing?: Total 6 Click Score: 12    End of Session    OT Visit Diagnosis: Muscle weakness (generalized) (M62.81);Pain Pain - part of body: Leg(bil)   Activity Tolerance Patient tolerated treatment well   Patient Left in bed;with call bell/phone within reach;with bed alarm set   Nurse Communication          Time: 9702-6378 OT Time Calculation (min): 19 min  Charges: OT General Charges $OT Visit: 1 Visit OT Treatments $Therapeutic Activity: 8-22 mins  Lesle Chris, OTR/L Acute Rehabilitation Services [phone] [place] pager [phone] office 04/17/2019   [place] 04/17/2019, 2:48 PM

## 2019-04-17 NOTE — Progress Notes (Signed)
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 She presented with worsening generalized weakness and inability to walk or transfer.  Patient contacted oncology service who has recommended return to emergency department for further evaluation. Imaging studies demonstrated decrease epidural tumor at T9 and T12-L1 without spinal stenosis.  But there is a findings of progressive large epidural tumor from L4-S1 and severe spinal stenosis.  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  Minimal associated mass-effect appreciated.  Case discussed by Dr. Delton Coombes with Dr. Tammi Klippel (radiation oncologist) and has recommended transfer to [facility] for initiation of radiation treatments for lumbar spine and also the brain.   New events last 24 hours / Subjective: No new events overnight.  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  Decreased epidural tumor T9 and T12-L1, pathologic T9 compression fracture, minimally displaced pathologic sternal body fracture, stable to slightly progressive destruction right-sided lesion T3 -Continue Decadron -Effexor changed to cymbalta for neuropathic pain control -Planning for radiation tx today   Pancytopenia -Acute neutropenia, acute thrombocytopenia, acute on chronic anemia, wbc/plt was normal two weeks ago -Worsening of pancytopenia from progressive cancer, XRT treatment side effect  -Appreciated hematology/oncology Dr Lindi Adie recommendation, supportive transfusion to keep hgb > 7, plt >10k, no GSCF for dec WBC due to XRT per Dr Lindi Adie -Stable this morning   ESBL UTI, related to chronic indwelling foley catheter, POA   -Continue meropenem ( she is immunosuppressed and has neutropenia), foley changed on 6/23  Essential hypertension -D/c norvasc, change to lopressor due to short runs of V Tach on tele.  Continue Lasix  Anxiety/depression -Effexor changed to cymbalta for added neuropathic pain benefit.  Continue Xanax  GERD -Continue PPI  Physical deconditioning and generalized weakness -Physical therapy has evaluated patient and has recommended skilled nursing facility for rehabilitation once medically stable  Obesity  -Body mass index is 32.91 kg/m.   DVT prophylaxis: SCD Code Status: DNR Family Communication: None Disposition Plan: SNF once stable   Consultants:   Oncology  Radiation oncology  Procedures:   None  Antimicrobials:  Anti-infectives (From admission, onward)   Start     Dose/Rate Route  Frequency Ordered  Stop   04/12/19 1100  meropenem (MERREM) 1 g in sodium chloride 0.9 % 100 mL IVPB     1 g 200 mL/hr over 30 Minutes Intravenous Every 8 hours 04/12/19 1013     04/10/19 0130  cefTRIAXone (ROCEPHIN) 1 g in sodium chloride 0.9 % 100 mL IVPB  Status:  Discontinued     1 g 200 mL/hr over 30 Minutes Intravenous Every 24 hours 04/10/19 0125 04/12/19 1013   04/09/19 1945  cephALEXin (KEFLEX) capsule 500 mg     500 mg Oral  Once 04/09/19 1941 04/09/19 1945        Objective: Vitals:   04/16/19 1914 04/16/19 2240 04/17/19 0140 04/17/19 0448  BP: (!) 156/86 (!) 146/91  (!) 147/94  Pulse: 95 77  81  Resp: 20 14  14   Temp: 97.8 F (36.6 C) 98.1 F (36.7 C)  98.9 F (37.2 C)  TempSrc: Oral Oral  Oral  SpO2: 97% 97%  97%  Weight:   95.2 kg   Height:        Intake/Output Summary (Last 24 hours) at 04/17/2019 1351 Last data filed at 04/17/2019 1230 Gross per 24 hour  Intake 584.5 ml  Output 1350 ml  Net -765.5 ml   Filed Weights   04/15/19 0600 04/16/19 0500 04/17/19 0140  Weight: 91.6 kg 92.5 kg 95.2 kg    Examination:  General exam: Appears calm and comfortable  Respiratory system: Clear to auscultation. Respiratory effort normal. Cardiovascular system: S1 & S2 heard, RRR. No JVD, murmurs, rubs, gallops or clicks. No pedal edema. Gastrointestinal system: Abdomen is nondistended, soft and nontender. No organomegaly or masses felt. Normal bowel sounds heard. Central nervous system: Alert and oriented. No focal neurological deficits. Extremities: Symmetric, weak bilaterally right greater than left Skin: No rashes, lesions or ulcers Psychiatry: Judgement and insight appear normal. Mood & affect appropriate.   Data Reviewed: I have personally reviewed following labs and imaging studies  CBC: Recent Labs  Lab 04/13/19 0536 04/14/19 0552 04/15/19 0557 04/16/19 0536 04/16/19 2125  WBC 1.0* 1.0* 0.8* 0.6* 0.5*  NEUTROABS 0.8* 0.8* 0.6* 0.4* 0.3*  HGB 8.9* 8.5*  8.5* 8.4* 7.8*  HCT 29.2* 27.4* 27.4* 27.2* 24.5*  MCV 83.4 82.5 81.5 81.2 81.1  PLT 20* 15* 11* 8* 25*   Basic Metabolic Panel: Recent Labs  Lab 04/13/19 0536 04/14/19 0552 04/15/19 0557  NA 140 140 140  K 4.1 4.3 4.2  CL 107 106 107  CO2 23 21* 23  GLUCOSE 107* 110* 113*  BUN 22* 24* 24*  CREATININE 0.36* <0.30* <0.30*  CALCIUM 8.8* 8.7* 8.7*  MG 2.2  --  2.3   GFR: CrCl cannot be calculated (This lab value cannot be used to calculate CrCl because it is not a number: <0.30). Liver Function Tests: Recent Labs  Lab 04/13/19 0536 04/14/19 0552 04/15/19 0557  AST 50* 44* 41  ALT 74* 69* 68*  ALKPHOS 188* 178* 185*  BILITOT 0.5 0.6 0.5  PROT 6.1* 5.9* 6.0*  ALBUMIN 2.6* 2.6* 2.6*   No results for input(s): LIPASE, AMYLASE in the last 168 hours. No results for input(s): AMMONIA in the last 168 hours. Coagulation Profile: No results for input(s): INR, PROTIME in the last 168 hours. Cardiac Enzymes: No results for input(s): CKTOTAL, CKMB, CKMBINDEX, TROPONINI in the last 168 hours. BNP (last 3 results) No results for input(s): PROBNP in the last 8760 hours. HbA1C: No results for input(s): HGBA1C in the last 72 hours. CBG: No results  for input(s): GLUCAP in the last 168 hours. Lipid Profile: No results for input(s): CHOL, HDL, LDLCALC, TRIG, CHOLHDL, LDLDIRECT in the last 72 hours. Thyroid Function Tests: No results for input(s): TSH, T4TOTAL, FREET4, T3FREE, THYROIDAB in the last 72 hours. Anemia Panel: No results for input(s): VITAMINB12, FOLATE, FERRITIN, TIBC, IRON, RETICCTPCT in the last 72 hours. Sepsis Labs: No results for input(s): PROCALCITON, LATICACIDVEN in the last 168 hours.  Recent Results (from the past 240 hour(s))  Urine Culture     Status: Abnormal   Collection Time: 04/09/19  6:15 PM   Specimen: Urine, Catheterized  Result Value Ref Range Status   Specimen Description   Final    URINE, CATHETERIZED Performed at [facility], [address], [place], [place] [address]    Special Requests   Final    Normal Performed at [facility], [address] [place], [place] [address]    Culture (A)  Final    80,000 COLONIES/mL ESCHERICHIA COLI Confirmed Extended Spectrum Beta-Lactamase Producer (ESBL).  In bloodstream infections from ESBL organisms, carbapenems are preferred over piperacillin/tazobactam. They are shown to have a lower risk of mortality.    Report Status 04/12/2019 FINAL  Final   Organism ID, Bacteria ESCHERICHIA COLI (A)  Final      Susceptibility   Escherichia coli - MIC*    AMPICILLIN >=32 RESISTANT Resistant     CEFAZOLIN >=64 RESISTANT Resistant     CEFTRIAXONE >=64 RESISTANT Resistant     CIPROFLOXACIN >=4 RESISTANT Resistant     GENTAMICIN >=16 RESISTANT Resistant     IMIPENEM <=0.25 SENSITIVE Sensitive     NITROFURANTOIN <=16 SENSITIVE Sensitive     TRIMETH/SULFA >=320 RESISTANT Resistant     AMPICILLIN/SULBACTAM >=32 RESISTANT Resistant     PIP/TAZO 8 SENSITIVE Sensitive     Extended ESBL POSITIVE Resistant     * 80,000 COLONIES/mL ESCHERICHIA COLI  Novel Coronavirus,NAA,(SEND-OUT TO REF LAB - TAT 24-48 hrs); Hosp Order     Status: None   Collection Time: 04/10/19  1:56 AM   Specimen: Nasopharyngeal Swab; Respiratory  Result Value Ref Range Status   SARS-CoV-2, NAA NOT DETECTED NOT DETECTED Final    Comment: (NOTE) This test was developed and its performance characteristics determined by [employer]. This test has not been FDA cleared or approved. This test has been authorized by FDA under an Emergency Use Authorization (EUA). This test is only authorized for the duration of time the declaration that circumstances exist justifying the authorization of the emergency use of in vitro diagnostic tests for detection of SARS-CoV-2 virus and/or diagnosis of COVID-19 infection under section 564(b)(1) of the Act, 21 U.S.C. 824MPN-3(I)(1), unless the authorization is terminated or revoked sooner. When  diagnostic testing is negative, the possibility of a false negative result should be considered in the context of a patient's recent exposures and the presence of clinical signs and symptoms consistent with COVID-19. An individual without symptoms of COVID-19 and who is not shedding SARS-CoV-2 virus would expect to have a negative (not detected) result in this assay. Performed  At: [facility] [address], [place] [id number] Rush Farmer MD QP:[id number]    [place]  Final    Comment: Performed at [facility], [address] [place], [place] [address]       Radiology Studies: No results found.    Scheduled Meds: . sodium chloride   Intravenous Once  . dexamethasone  4 mg Oral Q6H  . DULoxetine  20 mg  Oral BID  . feeding supplement (PRO-STAT SUGAR FREE 64)  30 mL Oral BID  . furosemide  20 mg Oral Daily  . hydrocortisone   Rectal BID  . metoprolol tartrate  12.5 mg Oral BID  . pantoprazole  40 mg Oral Daily  . senna-docusate  1 tablet Oral BID   Continuous Infusions: . meropenem (MERREM) IV 1 g (04/17/19 1134)     LOS: 7 days    Time spent: 35 minutes   Dessa Phi, DO [facility] [email or web address] 04/17/2019, 1:51 PM
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
## 2019-04-17 NOTE — TOC Progression Note (Signed)
Transition of Care [facility]) - Progression Note    Patient Details  Name: Brandi Rodriguez MRN: [id number] Date of Birth: 12/09/1960  Transition of Care [facility]) CM/SW Contact  Caron Ode, Juliann Pulse, RN Phone Number: 04/17/2019, 2:02 PM  Clinical Narrative: Left vm w/spouse Shanon Brow on both tel#'s [phone] to offer bed choices of SNF that have accepted-await call back.      Expected Discharge Plan: Skilled Nursing Facility Barriers to Discharge: Insurance Authorization  Expected Discharge Plan and Services Expected Discharge Plan: [place]       Living arrangements for the past 2 months: Single Family Home                                       Social Determinants of Health (SDOH) Interventions    Readmission Risk Interventions No flowsheet data found.

## 2019-04-18 ENCOUNTER — Ambulatory Visit: Payer: BC Managed Care – PPO

## 2019-04-18 LAB — BASIC METABOLIC PANEL WITH GFR
Anion gap: 10 (ref 5–15)
BUN: 22 mg/dL — ABNORMAL HIGH (ref 6–20)
CO2: 25 mmol/L (ref 22–32)
Calcium: 8.6 mg/dL — ABNORMAL LOW (ref 8.9–10.3)
Chloride: 101 mmol/L (ref 98–111)
Creatinine, Ser: <0.3 mg/dL — ABNORMAL LOW (ref 0.44–1.00)
Glucose, Bld: 108 mg/dL — ABNORMAL HIGH (ref 70–99)
Potassium: 4.4 mmol/L (ref 3.5–5.1)
Sodium: 136 mmol/L (ref 135–145)

## 2019-04-18 LAB — CBC
HCT: 24.2 % — ABNORMAL LOW (ref 36.0–46.0)
Hemoglobin: 7.5 g/dL — ABNORMAL LOW (ref 12.0–15.0)
MCH: 25 pg — ABNORMAL LOW (ref 26.0–34.0)
MCHC: 31 g/dL (ref 30.0–36.0)
MCV: 80.7 fL (ref 80.0–100.0)
Platelets: 15 K/uL — CL (ref 150–400)
RBC: 3 MIL/uL — ABNORMAL LOW (ref 3.87–5.11)
RDW: 13.3 % (ref 11.5–15.5)
WBC: 0.4 K/uL — CL (ref 4.0–10.5)
nRBC: 4.8 % — ABNORMAL HIGH (ref 0.0–0.2)

## 2019-04-18 MED ORDER — TBO-FILGRASTIM 480 MCG/0.8ML ~~LOC~~ SOSY
480.0000 ug | PREFILLED_SYRINGE | Freq: Every day | SUBCUTANEOUS | Status: AC
  Administered 2019-04-18 – 2019-04-21 (×4): 480 ug via SUBCUTANEOUS
  Filled 2019-04-18 (×4): qty 0.8

## 2019-04-18 NOTE — Progress Notes (Addendum)
HEMATOLOGY-ONCOLOGY PROGRESS NOTE  SUBJECTIVE: Continues to have lower extremity weakness and right eye ptosis.  Reports numbness in her lower extremities.  Denies bleeding.  Remains afebrile.  Remains on oxygen.  Radiation has been on hold secondary to pancytopenia.  REVIEW OF SYSTEMS:   As above all other systems neg  PHYSICAL EXAMINATION: ECOG PERFORMANCE STATUS: 2 - Symptomatic, <50% confined to bed  Vitals:   04/17/19 2158 04/18/19 0507  BP: (!) 153/90 (!) 147/84  Pulse: 90 78  Resp: 16 20  Temp: 97.7 F (36.5 C) (!) 97.5 F (36.4 C)  SpO2: 95% 95%   Filed Weights   04/16/19 0500 04/17/19 0140 04/18/19 0137  Weight: 203 lb 14.8 oz (92.5 kg) 209 lb 14.1 oz (95.2 kg) 212 lb 8.4 oz (96.4 kg)   Exam:  General: Appears calm and comfortable  Respiratory system: Clear to auscultation. Respiratory effort normal. Cardiovascular system: S1 & S2 heard, RRR. No JVD, murmurs, rubs, gallops or clicks. No pedal edema. Gastrointestinal system: Abdomen is nondistended, soft and nontender. No organomegaly or masses felt. Normal bowel sounds heard. Neuro: Alert and oriented. LE weakness R>L.  Skin:  Several ecchymotic areas noted on her upper extremities.  LABORATORY DATA:  I have reviewed the data as listed CMP Latest Ref Rng & Units 04/18/2019 04/15/2019 04/14/2019  Glucose 70 - 99 mg/dL 108(H) 113(H) 110(H)  BUN 6 - 20 mg/dL 22(H) 24(H) 24(H)  Creatinine 0.44 - 1.00 mg/dL <0.30(L) <0.30(L) <0.30(L)  Sodium 135 - 145 mmol/L 136 140 140  Potassium 3.5 - 5.1 mmol/L 4.4 4.2 4.3  Chloride 98 - 111 mmol/L 101 107 106  CO2 22 - 32 mmol/L 25 23 21(L)  Calcium 8.9 - 10.3 mg/dL 8.6(L) 8.7(L) 8.7(L)  Total Protein 6.5 - 8.1 g/dL - 6.0(L) 5.9(L)  Total Bilirubin 0.3 - 1.2 mg/dL - 0.5 0.6  Alkaline Phos 38 - 126 U/L - 185(H) 178(H)  AST 15 - 41 U/L - 41 44(H)  ALT 0 - 44 U/L - 68(H) 69(H)    Lab Results  Component Value Date   WBC 0.4 (LL) 04/18/2019   HGB 7.5 (L) 04/18/2019   HCT 24.2 (L)  04/18/2019   MCV 80.7 04/18/2019   PLT 15 (LL) 04/18/2019   NEUTROABS 0.3 (L) 04/16/2019    ASSESSMENT AND PLAN: 1. Pancytopenia: Secondary to prior radiation therapy.  CBC from today has been reviewed and white blood cell count remains low at 0.4, hemoglobin 7.5, and platelets of 15,000.  Discussed with Dr. Lindi Adie and since radiation is currently on hold due to pancytopenia, will initiate Granix 480 mcg subcu daily. I have ordered this daily x4 days.  However, this can be stopped when her Singac is above 1.0.  Transfuse packed red blood cells for hemoglobin less than 7.0 or active bleeding and transfuse platelets if her platelet count is less than 10,000 or active bleeding.  No transfusion is indicated today.  If her counts do not begin to recover next week, we may consider ordering a bone marrow biopsy. 2. Met Neuroendocrine tumor: Follows Dr.Katragadda and Dr.Manning.  I have spoken with Dr. Johny Shears nurse regarding the patient's lab work and plans to initiate Granix.  Radiation oncology is following her chart and will resume radiation when her counts improve.  Mikey Bussing, DNP, AGPCNP-BC, AOCNP  Attending Note  I personally saw the patient, reviewed the chart and examined the patient.  Pancytopenia: Counts were normal 2 weeks ago and since radiation was initiated the blood counts went down.  Radiation is being hold for pancytopenia. We will administer Granix to improve the neutrophil count. For the red blood cell count and platelets transfusions may be given. If her counts do not improve by next week we can consider doing a bone marrow biopsy.

## 2019-04-18 NOTE — Progress Notes (Signed)
Physical Therapy Treatment Patient Details Name: Brandi Rodriguez MRN: [id number] DOB: [birth year] Today's Date: 04/18/2019    History of Present Illness Brandi Rodriguez  is a 58 y.o. female,  58 y.o. female with medical history significant for metastatic lung cancer who was sent to the [facility] ED from the cancer center 03/27/19 with reports of falls and lower extremity weakness. She reported lower extremity weakness left worse than right that started one day PTA with pain in her back also. One week prior she noticed that her right eyelid drooped.    PT Comments    Pt participated in bed level LE exercises, states movement improved LE pain from a 9 to a 0. Pt refused OOB activity again this session, and declined even sitting EOB. PT again educated pt on the importance of mobility for preventative measures, pt states she is moving (rolling, moving LEs) every 2 hours or so in bed for pressure relief. PT to continue to follow acutely.   Follow Up Recommendations  SNF;Supervision/Assistance - 24 hour     Equipment Recommendations  None recommended by PT    Recommendations for Other Services       Precautions / Restrictions Precautions Precautions: Fall Precaution Comments: check labs Restrictions Weight Bearing Restrictions: No    Mobility  Bed Mobility Overal bed mobility: Needs Assistance Bed Mobility: Rolling Rolling: Mod assist         General bed mobility comments: Mod assist for rolling to L to check bed pads and perform pressure relief, assist for completion of roll onto side with use of bed pads. Pt defers further mobility today, even with max verbal encouragement. Pt required boost function with max assist with use of bed pads for positioning after LE exercises and rolling.  Transfers Overall transfer level: (pt declines)               General transfer comment: pt declines  Ambulation/Gait             General Gait Details: not  performed   Stairs             Wheelchair Mobility    Modified Rankin (Stroke Patients Only)       Balance                                            Cognition Arousal/Alertness: Awake/alert Behavior During Therapy: WFL for tasks assessed/performed;Flat affect Overall Cognitive Status: No family/caregiver present to determine baseline cognitive functioning                                        Exercises General Exercises - Lower Extremity Ankle Circles/Pumps: Both;PROM;20 reps;Supine Heel Slides: AAROM;Both;Supine;20 reps Hip ABduction/ADduction: Both;AAROM;Supine;20 reps    General Comments        Pertinent Vitals/Pain Pain Assessment: 0-10 Pain Score: 9  Pain Location: bil LEs, R>L Pain Descriptors / Indicators: Sore;Aching;Throbbing Pain Intervention(s): Limited activity within patient's tolerance;Monitored during session;Repositioned    Home Living                      Prior Function            PT Goals (current goals can now be found in the care plan section) Acute Rehab PT Goals Patient Stated  Goal: return home after rehab PT Goal Formulation: With patient Time For Goal Achievement: 04/24/19 Potential to Achieve Goals: Fair Progress towards PT goals: Progressing toward goals    Frequency    Min 3X/week      PT Plan Current plan remains appropriate    Co-evaluation              AM-PAC PT "6 Clicks" Mobility   Outcome Measure  Help needed turning from your back to your side while in a flat bed without using bedrails?: A Lot Help needed moving from lying on your back to sitting on the side of a flat bed without using bedrails?: A Lot Help needed moving to and from a bed to a chair (including a wheelchair)?: A Lot Help needed standing up from a chair using your arms (e.g., wheelchair or bedside chair)?: Total Help needed to walk in hospital room?: Total Help needed climbing 3-5 steps  with a railing? : Total 6 Click Score: 9    End of Session Equipment Utilized During Treatment: Gait belt Activity Tolerance: Patient limited by fatigue;Patient limited by pain Patient left: in bed;with call bell/phone within reach;with bed alarm set;with nursing/sitter in room Nurse Communication: Mobility status PT Visit Diagnosis: Unsteadiness on feet (R26.81);History of falling (Z91.81);Other abnormalities of gait and mobility (R26.89)     Time: 1415-1436 PT Time Calculation (min) (ACUTE ONLY): 21 min  Charges:  $Therapeutic Exercise: 8-22 mins                     Akin Yi Conception Chancy, PT Acute Rehabilitation Services Pager [phone]  Office [phone]  Almir Botts D Korinna Tat 04/18/2019, 3:10 PM

## 2019-04-18 NOTE — Progress Notes (Signed)
PROGRESS NOTE    Brandi Rodriguez  JQB:[id number] DOB: [birth year] DOA: 04/09/2019 PCP: Manon Hilding, MD     Brief Narrative:  Brandi Rodriguez is a 58 year old female with a past medical history significant for metastatic lung cancer, with secondary Horner syndrome, chronic respiratory failure requiring 3 L of nasal cannula supplementation and a spinal cord affectation causing urinary retention (Foley dependent) and increased lower extremity weakness. She presented with worsening generalized weakness and inability to walk or transfer.  Patient contacted oncology service who has recommended return to emergency department for further evaluation. Imaging studies demonstrated decrease epidural tumor at T9 and T12-L1 without spinal stenosis.  But there is a findings of progressive large epidural tumor from L4-S1 and severe spinal stenosis.  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  Minimal associated mass-effect appreciated.  Case discussed by Dr. Delton Coombes with Dr. Tammi Klippel (radiation oncologist) and has recommended transfer to [facility] for initiation of radiation treatments for lumbar spine and also the brain.   New events last 24 hours / Subjective: No new complaints, continues to have LE weakness. 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  Decreased epidural tumor T9 and T12-L1, pathologic T9 compression fracture, minimally displaced pathologic sternal body fracture, stable to slightly progressive destruction right-sided lesion T3 -Continue Decadron -Effexor changed to cymbalta for neuropathic pain control -Discussed with oncology NP today   Pancytopenia -Acute neutropenia, acute thrombocytopenia, acute on chronic anemia, wbc/plt was normal two weeks ago -Worsening of pancytopenia from progressive cancer, XRT treatment side effect  -Appreciated hematology/oncology Dr Lindi Adie recommendation, supportive transfusion to keep hgb > 7, plt >10k, no GSCF for dec WBC due to XRT per Dr Lindi Adie -?Granix today  -Continue to trend CBC   ESBL UTI, related to chronic indwelling foley catheter, POA   -Continue meropenem ( she is immunosuppressed and has neutropenia), foley changed on 6/23  Essential hypertension -D/c norvasc, change to lopressor due to short runs of V Tach on tele.  Continue Lasix  Anxiety/depression -Effexor changed to cymbalta for added neuropathic pain benefit.  Continue Xanax  GERD -Continue PPI  Physical deconditioning and generalized weakness -Physical therapy has evaluated patient and has recommended skilled nursing facility for rehabilitation once medically stable  Obesity  -Body mass index is 32.91 kg/m.   DVT prophylaxis: SCD Code Status: DNR Family Communication: Husband over the phone  Disposition Plan: SNF once stable   Consultants:   Oncology  Radiation oncology  Procedures:   None  Antimicrobials:  Anti-infectives (From admission, onward)    Start  Dose/Rate Route Frequency Ordered Stop   04/12/19 1100  meropenem (MERREM) 1 g in sodium chloride 0.9 % 100 mL IVPB     1 g 200 mL/hr over 30 Minutes Intravenous Every 8 hours 04/12/19 1013     04/10/19 0130  cefTRIAXone (ROCEPHIN) 1 g in sodium chloride 0.9 % 100 mL IVPB  Status:  Discontinued     1 g 200 mL/hr over 30 Minutes Intravenous Every 24 hours 04/10/19 0125 04/12/19 1013   04/09/19 1945  cephALEXin (KEFLEX) capsule 500 mg     500 mg Oral  Once 04/09/19 1941 04/09/19 1945       Objective: Vitals:   04/17/19 1422 04/17/19 2158 04/18/19 0137 04/18/19 0507  BP: (!) 149/88 (!) 153/90  (!) 147/84  Pulse: 90 90  78  Resp: 16 16  20   Temp: 98.4 F (36.9 C) 97.7 F (36.5 C)  (!) 97.5 F (36.4 C)  TempSrc: Oral     SpO2: 96% 95%  95%  Weight:   96.4 kg   Height:        Intake/Output Summary (Last 24 hours) at 04/18/2019 1103 Last data filed at 04/18/2019 0932 Gross per 24 hour  Intake 480 ml  Output 1850 ml  Net -1370 ml   Filed Weights   04/16/19 0500 04/17/19 0140 04/18/19 0137  Weight: 92.5 kg 95.2 kg 96.4 kg    Examination: General exam: Appears calm and comfortable  Respiratory system: Clear to auscultation. Respiratory effort normal. Cardiovascular system: S1 & S2 heard, RRR. No JVD, murmurs, rubs, gallops or clicks. No pedal edema. Gastrointestinal system: Abdomen is nondistended, soft and nontender. No organomegaly or masses felt. Normal bowel sounds heard. Central nervous system: Alert and oriented. LE weakness R>L. Extremities: Symmetric  Skin: +Bruising  Psychiatry: Judgement and insight appear normal. Mood & affect appropriate.    Data Reviewed: I have personally reviewed following labs and imaging studies  CBC: Recent Labs  Lab 04/13/19 0536 04/14/19 0552 04/15/19 0557 04/16/19 0536 04/16/19 2125 04/18/19 0624  WBC 1.0* 1.0* 0.8* 0.6* 0.5* 0.4*  NEUTROABS 0.8* 0.8* 0.6* 0.4* 0.3*  --   HGB 8.9* 8.5* 8.5* 8.4* 7.8* 7.5*  HCT 29.2*  27.4* 27.4* 27.2* 24.5* 24.2*  MCV 83.4 82.5 81.5 81.2 81.1 80.7  PLT 20* 15* 11* 8* 25* 15*   Basic Metabolic Panel: Recent Labs  Lab 04/13/19 0536 04/14/19 0552 04/15/19 0557 04/18/19 0624  NA 140 140 140 136  K 4.1 4.3 4.2 4.4  CL 107 106 107 101  CO2 23 21* 23 25  GLUCOSE 107* 110* 113* 108*  BUN 22* 24* 24* 22*  CREATININE 0.36* <0.30* <0.30* <0.30*  CALCIUM 8.8* 8.7* 8.7* 8.6*  MG 2.2  --  2.3  --    GFR: CrCl cannot be calculated (This lab value cannot be used to calculate CrCl because it is not a number: <0.30). Liver Function Tests: Recent Labs  Lab 04/13/19 0536 04/14/19 0552 04/15/19 0557  AST 50* 44* 41  ALT 74* 69* 68*  ALKPHOS 188* 178* 185*  BILITOT 0.5 0.6 0.5  PROT 6.1* 5.9* 6.0*  ALBUMIN 2.6* 2.6* 2.6*   No results for input(s): LIPASE, AMYLASE in the last 168 hours. No results for input(s): AMMONIA in the last 168 hours. Coagulation Profile: No results for input(s): INR, PROTIME in the last 168 hours. Cardiac Enzymes: No results for input(s): CKTOTAL, CKMB, CKMBINDEX, TROPONINI in the last 168 hours. BNP (last 3 results) No results for input(s): PROBNP in  the last 8760 hours. HbA1C: No results for input(s): HGBA1C in the last 72 hours. CBG: No results for input(s): GLUCAP in the last 168 hours. Lipid Profile: No results for input(s): CHOL, HDL, LDLCALC, TRIG, CHOLHDL, LDLDIRECT in the last 72 hours. Thyroid Function Tests: No results for input(s): TSH, T4TOTAL, FREET4, T3FREE, THYROIDAB in the last 72 hours. Anemia Panel: No results for input(s): VITAMINB12, FOLATE, FERRITIN, TIBC, IRON, RETICCTPCT in the last 72 hours. Sepsis Labs: No results for input(s): PROCALCITON, LATICACIDVEN in the last 168 hours.  Recent Results (from the past 240 hour(s))  Urine Culture     Status: Abnormal   Collection Time: 04/09/19  6:15 PM   Specimen: Urine, Catheterized  Result Value Ref Range Status   Specimen Description   Final    URINE, CATHETERIZED  Performed at [facility], [address] [place], [place] [address]    Special Requests   Final    Normal Performed at [facility], [address] [place], [place] [address]    Culture (A)  Final    80,000 COLONIES/mL ESCHERICHIA COLI Confirmed Extended Spectrum Beta-Lactamase Producer (ESBL).  In bloodstream infections from ESBL organisms, carbapenems are preferred over piperacillin/tazobactam. They are shown to have a lower risk of mortality.    Report Status 04/12/2019 FINAL  Final   Organism ID, Bacteria ESCHERICHIA COLI (A)  Final      Susceptibility   Escherichia coli - MIC*    AMPICILLIN >=32 RESISTANT Resistant     CEFAZOLIN >=64 RESISTANT Resistant     CEFTRIAXONE >=64 RESISTANT Resistant     CIPROFLOXACIN >=4 RESISTANT Resistant     GENTAMICIN >=16 RESISTANT Resistant     IMIPENEM <=0.25 SENSITIVE Sensitive     NITROFURANTOIN <=16 SENSITIVE Sensitive     TRIMETH/SULFA >=320 RESISTANT Resistant     AMPICILLIN/SULBACTAM >=32 RESISTANT Resistant     PIP/TAZO 8 SENSITIVE Sensitive     Extended ESBL POSITIVE Resistant     * 80,000 COLONIES/mL ESCHERICHIA COLI  Novel Coronavirus,NAA,(SEND-OUT TO REF LAB - TAT 24-48 hrs); Hosp Order     Status: None   Collection Time: 04/10/19  1:56 AM   Specimen: Nasopharyngeal Swab; Respiratory  Result Value Ref Range Status   SARS-CoV-2, NAA NOT DETECTED NOT DETECTED Final    Comment: (NOTE) This test was developed and its performance characteristics determined by [employer]. This test has not been FDA cleared or approved. This test has been authorized by FDA under an Emergency Use Authorization (EUA). This test is only authorized for the duration of time the declaration that circumstances exist justifying the authorization of the emergency use of in vitro diagnostic tests for detection of SARS-CoV-2 virus and/or diagnosis of COVID-19 infection under section 564(b)(1) of the Act, 21 U.S.C. 329JJO-8(C)(1), unless the  authorization is terminated or revoked sooner. When diagnostic testing is negative, the possibility of a false negative result should be considered in the context of a patient's recent exposures and the presence of clinical signs and symptoms consistent with COVID-19. An individual without symptoms of COVID-19 and who is not shedding SARS-CoV-2 virus would expect to have a negative (not detected) result in this assay. Performed  At: [facility] [place], [place] [id number] Rush Farmer MD FU:[id number]    [place]  Final    Comment: Performed at [facility], [address], [place], [place] [address]       Radiology Studies: No results found.    Scheduled Meds: . sodium chloride  Intravenous Once  . dexamethasone  4 mg Oral Q6H  . DULoxetine  20 mg Oral BID  . feeding supplement (PRO-STAT SUGAR FREE 64)  30 mL Oral BID  . furosemide  20 mg Oral Daily  . hydrocortisone   Rectal BID  . metoprolol tartrate  12.5 mg Oral BID  . pantoprazole  40 mg Oral Daily  . senna-docusate  1 tablet Oral BID  . Tbo-filgastrim (GRANIX) SQ  480 mcg Subcutaneous Q1200   Continuous Infusions: . meropenem (MERREM) IV 1 g (04/18/19 0137)     LOS: 8 days    Time spent: 30 minutes   Dessa Phi, DO [facility] [email or web address] 04/18/2019, 11:03 AM

## 2019-04-19 ENCOUNTER — Ambulatory Visit: Payer: BC Managed Care – PPO

## 2019-04-19 LAB — PREPARE RBC (CROSSMATCH)

## 2019-04-19 LAB — HEPATIC FUNCTION PANEL
ALT: 71 U/L — ABNORMAL HIGH (ref 0–44)
AST: 42 U/L — ABNORMAL HIGH (ref 15–41)
Albumin: 2.5 g/dL — ABNORMAL LOW (ref 3.5–5.0)
Alkaline Phosphatase: 182 U/L — ABNORMAL HIGH (ref 38–126)
Bilirubin, Direct: 0.2 mg/dL (ref 0.0–0.2)
Indirect Bilirubin: 0.4 mg/dL (ref 0.3–0.9)
Total Bilirubin: 0.6 mg/dL (ref 0.3–1.2)
Total Protein: 5.5 g/dL — ABNORMAL LOW (ref 6.5–8.1)

## 2019-04-19 LAB — CBC
HCT: 22.4 % — ABNORMAL LOW (ref 36.0–46.0)
Hemoglobin: 7 g/dL — ABNORMAL LOW (ref 12.0–15.0)
MCH: 25 pg — ABNORMAL LOW (ref 26.0–34.0)
MCHC: 31.3 g/dL (ref 30.0–36.0)
MCV: 80 fL (ref 80.0–100.0)
Platelets: 8 10*3/uL — CL (ref 150–400)
RBC: 2.8 MIL/uL — ABNORMAL LOW (ref 3.87–5.11)
RDW: 13.3 % (ref 11.5–15.5)
WBC: 0.4 10*3/uL — CL (ref 4.0–10.5)
nRBC: 0 % (ref 0.0–0.2)

## 2019-04-19 MED ORDER — SODIUM CHLORIDE 0.9% FLUSH
10.0000 mL | Freq: Two times a day (BID) | INTRAVENOUS | Status: DC
  Administered 2019-04-19 – 2019-04-26 (×12): 10 mL

## 2019-04-19 MED ORDER — SODIUM CHLORIDE 0.9% FLUSH
10.0000 mL | INTRAVENOUS | Status: DC | PRN
  Administered 2019-04-22: 10 mL
  Filled 2019-04-19: qty 40

## 2019-04-19 MED ORDER — SODIUM CHLORIDE 0.9% IV SOLUTION
Freq: Once | INTRAVENOUS | Status: AC
  Administered 2019-04-19: 11:00:00 via INTRAVENOUS

## 2019-04-19 NOTE — Progress Notes (Signed)
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 She presented with worsening generalized weakness and inability to walk or transfer.  Patient contacted oncology service who has recommended return to emergency department for further evaluation. Imaging studies demonstrated decrease epidural tumor at T9 and T12-L1 without spinal stenosis.  But there is a findings of progressive large epidural tumor from L4-S1 and severe spinal stenosis.  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  Minimal associated mass-effect appreciated.  Case discussed by Dr. Delton Coombes with Dr. Tammi Klippel (radiation oncologist) and has recommended transfer to [facility] for initiation of radiation treatments for lumbar spine and also the brain.   New events last 24 hours / Subjective: No new complaints.  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  Decreased epidural tumor T9 and T12-L1, pathologic T9 compression fracture, minimally displaced pathologic sternal body fracture, stable to slightly progressive destruction right-sided lesion T3 -Continue Decadron -Effexor changed to cymbalta for neuropathic pain control -Radiation therapy on hold due to pancytopenia  Pancytopenia -Acute neutropenia, acute thrombocytopenia, acute on chronic anemia, wbc/plt was normal two weeks ago -Worsening of pancytopenia from progressive cancer, XRT treatment side effect  -Appreciated hematology/oncology Dr Lindi Adie recommendation, supportive transfusion to keep hgb > 7, plt >10k, no GSCF for dec WBC due to XRT per Dr Lindi Adie -Oncology ordered Granix.  We will also give packed red blood cell and platelet transfusion today -No evidence of bleeding noted or reported -Trend CBC  ESBL UTI, related to chronic indwelling foley catheter, POA   -Foley changed on 6/23.  Completed Merrem  Essential hypertension -D/c norvasc, change to lopressor due to short runs of V Tach on tele.  Continue Lasix  Anxiety/depression -Effexor changed to cymbalta for added neuropathic pain benefit.  Continue Xanax  GERD -Continue PPI  Physical deconditioning and generalized weakness -Physical therapy has evaluated patient and has recommended skilled nursing facility for rehabilitation once medically stable  Obesity  -Body mass index is 32.91 kg/m.   DVT prophylaxis: SCD Code Status: DNR Family Communication: None Disposition Plan: SNF once stable   Consultants:   Oncology  Radiation oncology  Procedures:   None  Antimicrobials:   Anti-infectives (From admission,  onward)   Start     Dose/Rate Route Frequency Ordered Stop   04/12/19 1100  meropenem (MERREM) 1 g in sodium chloride 0.9 % 100 mL IVPB     1 g 200 mL/hr over 30 Minutes Intravenous Every 8 hours 04/12/19 1013 04/18/19 1807   04/10/19 0130  cefTRIAXone (ROCEPHIN) 1 g in sodium chloride 0.9 % 100 mL IVPB  Status:  Discontinued     1 g 200 mL/hr over 30 Minutes Intravenous Every 24 hours 04/10/19 0125 04/12/19 1013   04/09/19 1945  cephALEXin (KEFLEX) capsule 500 mg     500 mg Oral  Once 04/09/19 1941 04/09/19 1945       Objective: Vitals:   04/18/19 1358 04/18/19 2211 04/19/19 0500 04/19/19 0514  BP: 132/75 (!) 144/81  133/88  Pulse: 94 84  82  Resp: 19 20  16   Temp: 97.9 F (36.6 C) 97.9 F (36.6 C)  (!) 97.4 F (36.3 C)  TempSrc:      SpO2: 96% 96%  95%  Weight:   96.6 kg   Height:        Intake/Output Summary (Last 24 hours) at 04/19/2019 1038 Last data filed at 04/19/2019 0940 Gross per 24 hour  Intake 1430 ml  Output 2575 ml  Net -1145 ml   Filed Weights   04/17/19 0140 04/18/19 0137 04/19/19 0500  Weight: 95.2 kg 96.4 kg 96.6 kg    Examination: General exam: Appears calm and comfortable  Respiratory system: Clear to auscultation. Respiratory effort normal. Cardiovascular system: S1 & S2 heard, RRR. No JVD, murmurs, rubs, gallops or clicks. No pedal edema. Gastrointestinal system: Abdomen is nondistended, soft and nontender. No organomegaly or masses felt. Normal bowel sounds heard. Central nervous system: Alert and oriented. . Extremities: Symmetric 5 x 5 power. Psychiatry: Judgement and insight appear normal. Mood & affect appropriate.    Data Reviewed: I have personally reviewed following labs and imaging studies  CBC: Recent Labs  Lab 04/13/19 0536 04/14/19 0552 04/15/19 0557 04/16/19 0536 04/16/19 2125 04/18/19 0624 04/19/19 0523  WBC 1.0* 1.0* 0.8* 0.6* 0.5* 0.4* 0.4*  NEUTROABS 0.8* 0.8* 0.6* 0.4* 0.3*  --   --    HGB 8.9* 8.5* 8.5* 8.4* 7.8* 7.5* 7.0*  HCT 29.2* 27.4* 27.4* 27.2* 24.5* 24.2* 22.4*  MCV 83.4 82.5 81.5 81.2 81.1 80.7 80.0  PLT 20* 15* 11* 8* 25* 15* 8*   Basic Metabolic Panel: Recent Labs  Lab 04/13/19 0536 04/14/19 0552 04/15/19 0557 04/18/19 0624  NA 140 140 140 136  K 4.1 4.3 4.2 4.4  CL 107 106 107 101  CO2 23 21* 23 25  GLUCOSE 107* 110* 113* 108*  BUN 22* 24* 24* 22*  CREATININE 0.36* <0.30* <0.30* <0.30*  CALCIUM 8.8* 8.7* 8.7* 8.6*  MG 2.2  --  2.3  --    GFR: CrCl cannot be calculated (This lab value cannot be used to calculate CrCl because it is not a number: <0.30). Liver Function Tests: Recent Labs  Lab 04/13/19 0536 04/14/19 0552 04/15/19 0557 04/19/19 0523  AST 50* 44* 41 42*  ALT 74* 69* 68* 71*  ALKPHOS 188* 178* 185* 182*  BILITOT 0.5 0.6 0.5 0.6  PROT 6.1* 5.9* 6.0* 5.5*  ALBUMIN 2.6* 2.6* 2.6* 2.5*   No results for input(s): LIPASE, AMYLASE in the last 168 hours. No results for input(s): AMMONIA in the last 168 hours. Coagulation Profile: No results for input(s): INR, PROTIME in the last 168 hours. Cardiac Enzymes: No  results for input(s): CKTOTAL, CKMB, CKMBINDEX, TROPONINI in the last 168 hours. BNP (last 3 results) No results for input(s): PROBNP in the last 8760 hours. HbA1C: No results for input(s): HGBA1C in the last 72 hours. CBG: No results for input(s): GLUCAP in the last 168 hours. Lipid Profile: No results for input(s): CHOL, HDL, LDLCALC, TRIG, CHOLHDL, LDLDIRECT in the last 72 hours. Thyroid Function Tests: No results for input(s): TSH, T4TOTAL, FREET4, T3FREE, THYROIDAB in the last 72 hours. Anemia Panel: No results for input(s): VITAMINB12, FOLATE, FERRITIN, TIBC, IRON, RETICCTPCT in the last 72 hours. Sepsis Labs: No results for input(s): PROCALCITON, LATICACIDVEN in the last 168 hours.  Recent Results (from the past 240 hour(s))  Urine Culture     Status: Abnormal   Collection Time: 04/09/19  6:15 PM   Specimen:  Urine, Catheterized  Result Value Ref Range Status   Specimen Description   Final    URINE, CATHETERIZED Performed at [facility], [address], [place], [place] [address]    Special Requests   Final    Normal Performed at [facility], [address], [place], [place] [address]    Culture (A)  Final    80,000 COLONIES/mL ESCHERICHIA COLI Confirmed Extended Spectrum Beta-Lactamase Producer (ESBL).  In bloodstream infections from ESBL organisms, carbapenems are preferred over piperacillin/tazobactam. They are shown to have a lower risk of mortality.    Report Status 04/12/2019 FINAL  Final   Organism ID, Bacteria ESCHERICHIA COLI (A)  Final      Susceptibility   Escherichia coli - MIC*    AMPICILLIN >=32 RESISTANT Resistant     CEFAZOLIN >=64 RESISTANT Resistant     CEFTRIAXONE >=64 RESISTANT Resistant     CIPROFLOXACIN >=4 RESISTANT Resistant     GENTAMICIN >=16 RESISTANT Resistant     IMIPENEM <=0.25 SENSITIVE Sensitive     NITROFURANTOIN <=16 SENSITIVE Sensitive     TRIMETH/SULFA >=320 RESISTANT Resistant     AMPICILLIN/SULBACTAM >=32 RESISTANT Resistant     PIP/TAZO 8 SENSITIVE Sensitive     Extended ESBL POSITIVE Resistant     * 80,000 COLONIES/mL ESCHERICHIA COLI  Novel Coronavirus,NAA,(SEND-OUT TO REF LAB - TAT 24-48 hrs); Hosp Order     Status: None   Collection Time: 04/10/19  1:56 AM   Specimen: Nasopharyngeal Swab; Respiratory  Result Value Ref Range Status   SARS-CoV-2, NAA NOT DETECTED NOT DETECTED Final    Comment: (NOTE) This test was developed and its performance characteristics determined by [employer]. This test has not been FDA cleared or approved. This test has been authorized by FDA under an Emergency Use Authorization (EUA). This test is only authorized for the duration of time the declaration that circumstances exist justifying the authorization of the emergency use of in vitro diagnostic tests for detection of SARS-CoV-2 virus and/or  diagnosis of COVID-19 infection under section 564(b)(1) of the Act, 21 U.S.C. 751WCH-8(N)(2), unless the authorization is terminated or revoked sooner. When diagnostic testing is negative, the possibility of a false negative result should be considered in the context of a patient's recent exposures and the presence of clinical signs and symptoms consistent with COVID-19. An individual without symptoms of COVID-19 and who is not shedding SARS-CoV-2 virus would expect to have a negative (not detected) result in this assay. Performed  At: [facility] [address] [place], [place] [id number] Rush Farmer MD IR:[id number]    [place]  Final    Comment: Performed at [facility], [address],  [place], [place] [address]       Radiology Studies: No results found.    Scheduled Meds: . sodium chloride   Intravenous Once  . sodium chloride   Intravenous Once  . dexamethasone  4 mg Oral Q6H  . DULoxetine  20 mg Oral BID  . feeding supplement (PRO-STAT SUGAR FREE 64)  30 mL Oral BID  . furosemide  20 mg Oral Daily  . hydrocortisone   Rectal BID  . metoprolol tartrate  12.5 mg Oral BID  . pantoprazole  40 mg Oral Daily  . senna-docusate  1 tablet Oral BID  . Tbo-filgastrim (GRANIX) SQ  480 mcg Subcutaneous Q1200   Continuous Infusions:    LOS: 9 days    Time spent: 20 minutes   Dessa Phi, DO [facility] [email or web address] 04/19/2019, 10:38 AM

## 2019-04-19 NOTE — Progress Notes (Signed)
CSW in contact with pt via Pt room phone. CSW informed pt of facilities who have extended bed offer. Pt instructed CSW to discuss this matter with her husband to assist in making decision.   CSW attempted to contact pts' husband at house and cell number in chart. CSW was unsuccessful in contacting pts husband to discuss future disposition.   Pt has bed offer accepted at  1) [facility] 2) [facility]   CSW will continue to receive bed offers while pt has continued medical workup. CSW will continue to contact pts husband to discuss discharge plan.   [place] Transitions of Care  Clinical Social Worker  Ph: [phone]

## 2019-04-20 ENCOUNTER — Ambulatory Visit: Payer: BC Managed Care – PPO

## 2019-04-20 LAB — TYPE AND SCREEN
ABO/RH(D): A POS
Antibody Screen: NEGATIVE
Unit division: 0
Unit division: 0

## 2019-04-20 LAB — BPAM RBC
Blood Product Expiration Date: 202007162359
Blood Product Expiration Date: 202007182359
ISSUE DATE / TIME: 202007031112
ISSUE DATE / TIME: 202007031442
Unit Type and Rh: 6200
Unit Type and Rh: 6200

## 2019-04-20 LAB — BPAM PLATELET PHERESIS
Blood Product Expiration Date: 202007042359
Blood Product Expiration Date: 202007042359
ISSUE DATE / TIME: 202007031423
ISSUE DATE / TIME: 202007031645
Unit Type and Rh: 6200
Unit Type and Rh: 9500

## 2019-04-20 LAB — CBC
HCT: 24.8 % — ABNORMAL LOW (ref 36.0–46.0)
Hemoglobin: 8.2 g/dL — ABNORMAL LOW (ref 12.0–15.0)
MCH: 26.9 pg (ref 26.0–34.0)
MCHC: 33.1 g/dL (ref 30.0–36.0)
MCV: 81.3 fL (ref 80.0–100.0)
Platelets: 36 10*3/uL — ABNORMAL LOW (ref 150–400)
RBC: 3.05 MIL/uL — ABNORMAL LOW (ref 3.87–5.11)
RDW: 13.7 % (ref 11.5–15.5)
WBC: 0.4 10*3/uL — CL (ref 4.0–10.5)
nRBC: 4.9 % — ABNORMAL HIGH (ref 0.0–0.2)

## 2019-04-20 LAB — PREPARE PLATELET PHERESIS
Unit division: 0
Unit division: 0

## 2019-04-20 NOTE — Progress Notes (Signed)
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 She presented with worsening generalized weakness and inability to walk or transfer.  Patient contacted oncology service who has recommended return to emergency department for further evaluation. Imaging studies demonstrated decrease epidural tumor at T9 and T12-L1 without spinal stenosis.  But there is a findings of progressive large epidural tumor from L4-S1 and severe spinal stenosis.  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  Minimal associated mass-effect appreciated.  Case discussed by Dr. Delton Coombes with Dr. Tammi Klippel (radiation oncologist) and has recommended transfer to [facility] for initiation of radiation treatments for lumbar spine and also the brain.   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  Decreased epidural tumor T9 and T12-L1, pathologic T9 compression fracture, minimally displaced pathologic sternal body fracture, stable to slightly progressive destruction right-sided lesion T3 -Continue Decadron -Effexor changed to cymbalta for neuropathic pain control -Radiation therapy on hold due to pancytopenia  Pancytopenia -Acute neutropenia, acute thrombocytopenia, acute on chronic anemia, wbc/plt was normal two weeks ago -Worsening of pancytopenia from progressive cancer, XRT treatment side effect  -Appreciated hematology/oncology Dr Lindi Adie recommendation, supportive transfusion to keep hgb > 7, plt >10k, no GSCF for dec WBC due to XRT per Dr Lindi Adie -Oncology ordered Granix.  -No evidence of bleeding noted or reported -Trend CBC, stable this morning   ESBL UTI, related to chronic indwelling foley catheter, POA   -Foley changed on 6/23.  Completed Merrem  Essential hypertension -D/c norvasc, change to lopressor due to short runs of V Tach on tele.  Continue Lasix  Anxiety/depression -Effexor changed to cymbalta for added neuropathic pain benefit.  Continue Xanax  GERD -Continue PPI  Physical deconditioning and generalized weakness -Physical therapy has evaluated patient and has recommended skilled nursing facility for rehabilitation once medically stable  Obesity  -Body mass index is 32.91 kg/m.   DVT prophylaxis: SCD Code Status: DNR Family Communication: None Disposition Plan: SNF once stable   Consultants:   Oncology  Radiation oncology  Procedures:   None  Antimicrobials:  Anti-infectives (From admission, onward)   Start     Dose/Rate Route Frequency  Ordered Stop   04/12/19 1100  meropenem (MERREM) 1 g in sodium chloride 0.9 % 100 mL IVPB     1 g 200 mL/hr over 30 Minutes Intravenous Every 8 hours 04/12/19 1013 04/18/19 1807   04/10/19 0130  cefTRIAXone (ROCEPHIN) 1 g in sodium chloride 0.9 % 100 mL IVPB  Status:  Discontinued     1 g 200 mL/hr over 30 Minutes Intravenous Every 24 hours 04/10/19 0125 04/12/19 1013   04/09/19 1945  cephALEXin (KEFLEX) capsule 500 mg     500 mg Oral  Once 04/09/19 1941 04/09/19 1945       Objective: Vitals:   04/19/19 1707 04/19/19 1903 04/19/19 2007 04/20/19 0532  BP: 119/89 129/68 129/82 138/83  Pulse: 87 100 90 88  Resp: 20 20 19 18   Temp: 98 F (36.7 C) 98 F (36.7 C) 98.4 F (36.9 C) 98.2 F (36.8 C)  TempSrc: Oral Oral Oral Oral  SpO2: 98% 97% 99% 100%  Weight:    97.6 kg  Height:        Intake/Output Summary (Last 24 hours) at 04/20/2019 1047 Last data filed at 04/20/2019 0532 Gross per 24 hour  Intake 1043.83 ml  Output 850 ml  Net 193.83 ml   Filed Weights   04/18/19 0137 04/19/19 0500 04/20/19 0532  Weight: 96.4 kg 96.6 kg 97.6 kg   Examination: General exam: Appears calm and comfortable  Respiratory system: Clear to auscultation. Respiratory effort normal. Cardiovascular system: S1 & S2 heard, RRR. No JVD, murmurs, rubs, gallops or clicks. No pedal edema. Gastrointestinal system: Abdomen is nondistended, soft and nontender. No organomegaly or masses felt. Normal bowel sounds heard. Central nervous system: Alert and oriented. No focal neurological deficits. Extremities: Symmetric 5 x 5 power. Skin: Bruising upper extremities noted Psychiatry: Judgement and insight appear normal. Mood & affect appropriate.    Data Reviewed: I have personally reviewed following labs and imaging studies  CBC: Recent Labs  Lab 04/14/19 0552 04/15/19 0557 04/16/19 0536 04/16/19 2125 04/18/19 0624 04/19/19 0523 04/20/19 0328  WBC 1.0* 0.8* 0.6* 0.5* 0.4* 0.4* 0.4*  NEUTROABS 0.8*  0.6* 0.4* 0.3*  --   --   --   HGB 8.5* 8.5* 8.4* 7.8* 7.5* 7.0* 8.2*  HCT 27.4* 27.4* 27.2* 24.5* 24.2* 22.4* 24.8*  MCV 82.5 81.5 81.2 81.1 80.7 80.0 81.3  PLT 15* 11* 8* 25* 15* 8* 36*   Basic Metabolic Panel: Recent Labs  Lab 04/14/19 0552 04/15/19 0557 04/18/19 0624  NA 140 140 136  K 4.3 4.2 4.4  CL 106 107 101  CO2 21* 23 25  GLUCOSE 110* 113* 108*  BUN 24* 24* 22*  CREATININE <0.30* <0.30* <0.30*  CALCIUM 8.7* 8.7* 8.6*  MG  --  2.3  --    GFR: CrCl cannot be calculated (This lab value cannot be used to calculate CrCl because it is not a number: <0.30). Liver Function Tests: Recent Labs  Lab 04/14/19 0552 04/15/19 0557 04/19/19 0523  AST 44* 41 42*  ALT 69* 68* 71*  ALKPHOS 178* 185* 182*  BILITOT 0.6 0.5 0.6  PROT 5.9* 6.0* 5.5*  ALBUMIN 2.6* 2.6* 2.5*   No results for input(s): LIPASE, AMYLASE in the last 168 hours. No results for input(s): AMMONIA in the last 168 hours. Coagulation Profile: No results for input(s): INR, PROTIME in the last 168 hours. Cardiac Enzymes: No results for input(s): CKTOTAL, CKMB, CKMBINDEX, TROPONINI in the last 168 hours. BNP (last 3 results) No results for input(s):  PROBNP in the last 8760 hours. HbA1C: No results for input(s): HGBA1C in the last 72 hours. CBG: No results for input(s): GLUCAP in the last 168 hours. Lipid Profile: No results for input(s): CHOL, HDL, LDLCALC, TRIG, CHOLHDL, LDLDIRECT in the last 72 hours. Thyroid Function Tests: No results for input(s): TSH, T4TOTAL, FREET4, T3FREE, THYROIDAB in the last 72 hours. Anemia Panel: No results for input(s): VITAMINB12, FOLATE, FERRITIN, TIBC, IRON, RETICCTPCT in the last 72 hours. Sepsis Labs: No results for input(s): PROCALCITON, LATICACIDVEN in the last 168 hours.  No results found for this or any previous visit (from the past 240 hour(s)).     Radiology Studies: No results found.    Scheduled Meds: . sodium chloride   Intravenous Once  .  dexamethasone  4 mg Oral Q6H  . DULoxetine  20 mg Oral BID  . feeding supplement (PRO-STAT SUGAR FREE 64)  30 mL Oral BID  . furosemide  20 mg Oral Daily  . hydrocortisone   Rectal BID  . metoprolol tartrate  12.5 mg Oral BID  . pantoprazole  40 mg Oral Daily  . senna-docusate  1 tablet Oral BID  . sodium chloride flush  10-40 mL Intracatheter Q12H  . Tbo-filgastrim (GRANIX) SQ  480 mcg Subcutaneous Q1200   Continuous Infusions:    LOS: 10 days    Time spent: 25 minutes   Dessa Phi, DO [facility] [email or web address] 04/20/2019, 10:47 AM

## 2019-04-21 LAB — CBC
HCT: 25.2 % — ABNORMAL LOW (ref 36.0–46.0)
Hemoglobin: 8 g/dL — ABNORMAL LOW (ref 12.0–15.0)
MCH: 25.9 pg — ABNORMAL LOW (ref 26.0–34.0)
MCHC: 31.7 g/dL (ref 30.0–36.0)
MCV: 81.6 fL (ref 80.0–100.0)
Platelets: 17 10*3/uL — CL (ref 150–400)
RBC: 3.09 MIL/uL — ABNORMAL LOW (ref 3.87–5.11)
RDW: 13.9 % (ref 11.5–15.5)
WBC: 0.3 10*3/uL — CL (ref 4.0–10.5)
nRBC: 5.9 % — ABNORMAL HIGH (ref 0.0–0.2)

## 2019-04-21 NOTE — TOC Progression Note (Signed)
Transition of Care [facility]) - Progression Note    Patient Details  Name: Brandi Rodriguez MRN: [id number] Date of Birth: 1961/03/18  Transition [facility] CM/SW [place], [place] Phone Number: 04/21/2019, 9:23 AM  Clinical Narrative:   LCSW spoke with husband. Husband chooses [facility] SNF for patient. LCSW spoke with Juliann Pulse, [facility] Liaison and provided this update. Patient will need a negative COVID test within the last 72 hours post hospital discharge. Juliann Pulse also requested that we send detailed PT/Nursing information once patient's discharge has been scheduled to help with insurance authorization.   Expected Discharge Plan: Skilled Nursing Facility Barriers to Discharge: Insurance Authorization  Expected Discharge Plan and Services Expected Discharge Plan: [place]     Living arrangements for the past 2 months: Single Family Home   Readmission Risk Interventions No flowsheet data found.  Eula Fried, BSW, MSW, [email or web address] Phone: [phone]

## 2019-04-21 NOTE — Progress Notes (Signed)
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 She presented with worsening generalized weakness and inability to walk or transfer.  Patient contacted oncology service who has recommended return to emergency department for further evaluation. Imaging studies demonstrated decrease epidural tumor at T9 and T12-L1 without spinal stenosis.  But there is a findings of progressive large epidural tumor from L4-S1 and severe spinal stenosis.  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  Minimal associated mass-effect appreciated.  Case discussed by Dr. Delton Coombes with Dr. Tammi Klippel (radiation oncologist) and has recommended transfer to [facility] for initiation of radiation treatments for lumbar spine and also the brain.   New events last 24 hours / Subjective: No bleeding noted today.  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  Decreased epidural tumor T9 and T12-L1, pathologic T9 compression fracture, minimally displaced pathologic sternal body fracture, stable to slightly progressive destruction right-sided lesion T3 -Continue Decadron -Effexor changed to cymbalta for neuropathic pain control -Radiation therapy on hold due to pancytopenia  Pancytopenia -Acute neutropenia, acute thrombocytopenia, acute on chronic anemia, wbc/plt was normal two weeks ago -Worsening of pancytopenia from progressive cancer, XRT treatment side effect  -Appreciated hematology/oncology Dr Lindi Adie recommendation, supportive transfusion to keep hgb > 7, plt >10k, no GSCF for dec WBC due to XRT per Dr Lindi Adie -Oncology ordered Granix.  -No evidence of bleeding noted or reported -Trend CBC  ESBL UTI, related to chronic indwelling foley catheter, POA   -Foley changed on 6/23.  Completed Merrem  Essential hypertension -D/c norvasc, change to lopressor due to short runs of V Tach on tele.  Continue Lasix  Anxiety/depression -Effexor changed to cymbalta for added neuropathic pain benefit.  Continue Xanax  GERD -Continue PPI  Physical deconditioning and generalized weakness -Physical therapy has evaluated patient and has recommended skilled nursing facility for rehabilitation once medically stable  Obesity  -Body mass index is 32.91 kg/m.   DVT prophylaxis: SCD Code Status: DNR Family Communication: None Disposition Plan: SNF once stable.  Oncology/radiation oncology plan for ?bone marrow biopsy, radiation therapy   Consultants:   Oncology  Radiation oncology  Procedures:   None   Antimicrobials:  Anti-infectives (From  admission, onward)   Start     Dose/Rate Route Frequency Ordered Stop   04/12/19 1100  meropenem (MERREM) 1 g in sodium chloride 0.9 % 100 mL IVPB     1 g 200 mL/hr over 30 Minutes Intravenous Every 8 hours 04/12/19 1013 04/18/19 1807   04/10/19 0130  cefTRIAXone (ROCEPHIN) 1 g in sodium chloride 0.9 % 100 mL IVPB  Status:  Discontinued     1 g 200 mL/hr over 30 Minutes Intravenous Every 24 hours 04/10/19 0125 04/12/19 1013   04/09/19 1945  cephALEXin (KEFLEX) capsule 500 mg     500 mg Oral  Once 04/09/19 1941 04/09/19 1945       Objective: Vitals:   04/20/19 0532 04/20/19 1353 04/20/19 2208 04/21/19 0516  BP: 138/83 119/84 138/87 130/74  Pulse: 88 88 92 81  Resp: _0 Temp: 98.2 F (36.8 C) 98.1 F (36.7 C) 97.8 F (36.6 C) 99.3 F (37.4 C)  TempSrc: Oral Oral Oral Oral  SpO2: 100% 97% 97% 98%  Weight: 97.6 kg   94.4 kg  Height:        Intake/Output Summary (Last 24 hours) at 04/21/2019 1014 Last data filed at 04/21/2019 0517 Gross per 24 hour  Intake -  Output 2225 ml  Net -2225 ml   Filed Weights   04/19/19 0500 04/20/19 0532 04/21/19 0516  Weight: 96.6 kg 97.6 kg 94.4 kg   Examination: General exam: Appears calm and comfortable  Respiratory system: Clear to auscultation. Respiratory effort normal. Cardiovascular system: S1 & S2 heard, RRR. No JVD, murmurs, rubs, gallops or clicks. No pedal edema. Gastrointestinal system: Abdomen is nondistended, soft and nontender. No organomegaly or masses felt. Normal bowel sounds heard. Central nervous system: Alert and oriented. No focal neurological deficits. Extremities: Symmetric 5 x 5 power. Skin: No rashes, lesions or ulcers Psychiatry: Judgement and insight appear normal. Mood & affect appropriate.    Data Reviewed: I have personally reviewed following labs and imaging studies  CBC: Recent Labs  Lab 04/15/19 0557 04/16/19 0536 04/16/19 2125 04/18/19 0624 04/19/19  0523 04/20/19 0328 04/21/19 0310  WBC 0.8* 0.6* 0.5* 0.4* 0.4* 0.4* 0.3*  NEUTROABS 0.6* 0.4* 0.3*  --   --   --   --   HGB 8.5* 8.4* 7.8* 7.5* 7.0* 8.2* 8.0*  HCT 27.4* 27.2* 24.5* 24.2* 22.4* 24.8* 25.2*  MCV 81.5 81.2 81.1 80.7 80.0 81.3 81.6  PLT 11* 8* 25* 15* 8* 36* 17*   Basic Metabolic Panel: Recent Labs  Lab 04/15/19 0557 04/18/19 0624  NA 140 136  K 4.2 4.4  CL 107 101  CO2 23 25  GLUCOSE 113* 108*  BUN 24* 22*  CREATININE <0.30* <0.30*  CALCIUM 8.7* 8.6*  MG 2.3  --    GFR: CrCl cannot be calculated (This lab value cannot be used to calculate CrCl because it is not a number: <0.30). Liver Function Tests: Recent Labs  Lab 04/15/19 0557 04/19/19 0523  AST 41 42*  ALT 68* 71*  ALKPHOS 185* 182*  BILITOT 0.5 0.6  PROT 6.0* 5.5*  ALBUMIN 2.6* 2.5*   No results for input(s): LIPASE, AMYLASE in the last 168 hours. No results for input(s): AMMONIA in the last 168 hours. Coagulation Profile: No results for input(s): INR, PROTIME in the last 168 hours. Cardiac Enzymes: No results for input(s): CKTOTAL, CKMB, CKMBINDEX, TROPONINI in the last 168 hours. BNP (last 3 results) No results for input(s): PROBNP in the last 8760 hours.  HbA1C: No results for input(s): HGBA1C in the last 72 hours. CBG: No results for input(s): GLUCAP in the last 168 hours. Lipid Profile: No results for input(s): CHOL, HDL, LDLCALC, TRIG, CHOLHDL, LDLDIRECT in the last 72 hours. Thyroid Function Tests: No results for input(s): TSH, T4TOTAL, FREET4, T3FREE, THYROIDAB in the last 72 hours. Anemia Panel: No results for input(s): VITAMINB12, FOLATE, FERRITIN, TIBC, IRON, RETICCTPCT in the last 72 hours. Sepsis Labs: No results for input(s): PROCALCITON, LATICACIDVEN in the last 168 hours.  No results found for this or any previous visit (from the past 240 hour(s)).     Radiology Studies: No results found.    Scheduled Meds: . sodium chloride   Intravenous Once  . dexamethasone   4 mg Oral Q6H  . DULoxetine  20 mg Oral BID  . feeding supplement (PRO-STAT SUGAR FREE 64)  30 mL Oral BID  . furosemide  20 mg Oral Daily  . hydrocortisone   Rectal BID  . metoprolol tartrate  12.5 mg Oral BID  . pantoprazole  40 mg Oral Daily  . senna-docusate  1 tablet Oral BID  . sodium chloride flush  10-40 mL Intracatheter Q12H  . Tbo-filgastrim (GRANIX) SQ  480 mcg Subcutaneous Q1200   Continuous Infusions:    LOS: 11 days    Time spent: 20 minutes   Dessa Phi, DO [facility] [email or web address] 04/21/2019, 10:14 AM

## 2019-04-22 ENCOUNTER — Ambulatory Visit: Payer: BC Managed Care – PPO

## 2019-04-22 DIAGNOSIS — D61818 Other pancytopenia: Secondary | ICD-10-CM

## 2019-04-22 DIAGNOSIS — R531 Weakness: Secondary | ICD-10-CM | POA: Diagnosis not present

## 2019-04-22 DIAGNOSIS — M899 Disorder of bone, unspecified: Secondary | ICD-10-CM | POA: Diagnosis not present

## 2019-04-22 DIAGNOSIS — C7951 Secondary malignant neoplasm of bone: Secondary | ICD-10-CM | POA: Diagnosis not present

## 2019-04-22 DIAGNOSIS — G9520 Unspecified cord compression: Secondary | ICD-10-CM | POA: Diagnosis not present

## 2019-04-22 LAB — CBC
HCT: 25.2 % — ABNORMAL LOW (ref 36.0–46.0)
Hemoglobin: 8.2 g/dL — ABNORMAL LOW (ref 12.0–15.0)
MCH: 26.3 pg (ref 26.0–34.0)
MCHC: 32.5 g/dL (ref 30.0–36.0)
MCV: 80.8 fL (ref 80.0–100.0)
Platelets: <5 10*3/uL — CL (ref 150–400)
RBC: 3.12 MIL/uL — ABNORMAL LOW (ref 3.87–5.11)
RDW: 14 % (ref 11.5–15.5)
WBC: 0.4 10*3/uL — CL (ref 4.0–10.5)
nRBC: 0 % (ref 0.0–0.2)

## 2019-04-22 MED ORDER — SODIUM CHLORIDE 0.9% IV SOLUTION
Freq: Once | INTRAVENOUS | Status: AC
  Administered 2019-04-22: 08:00:00 via INTRAVENOUS

## 2019-04-22 NOTE — Consult Note (Signed)
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 She presented with worsening generalized weakness and inability to walk or transfer.Imaging studies demonstrated decrease epidural tumor at T9 and T12-L1 without spinal stenosis but there is are findings of progressive large epidural tumor from L4-S1 and severe spinal stenosis. 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 Minimal associated mass-effect appreciated. She now has persistent pancytopenia of uncertain etiology (possibly from radiation therapy) and request received for CT guided bone marrow biopsy for further evaluation.   Past Medical History:  Diagnosis Date   Arthritis     Past Surgical History:  Procedure Laterality Date   AXILLARY LYMPH NODE BIOPSY Left 03/25/2019   Procedure: LEFT AXILLARY LYMPH NODE BIOPSY;  Surgeon: Aviva Signs, MD;  Location: [facility];  Service: General;  Laterality: Left;   BACK SURGERY     BREAST SURGERY      Allergies: Hydrocodone  Medications: Prior to Admission medications   Medication Sig Start Date End Date Taking? Authorizing Provider  ALPRAZolam Duanne Moron) 0.5 MG  tablet Take 0.5 mg by mouth 2 (two) times daily as needed for anxiety.  02/16/19  Yes [provider]  amLODipine (NORVASC) 5 MG tablet Take 1 tablet (5 mg total) by mouth daily. 03/29/19 03/28/20 Yes [place] Eric J, DO  furosemide (LASIX) 20 MG tablet Take 1 tablet (20 mg total) by mouth daily. 04/05/19  Yes Tyler Pita, MD  ibuprofen (ADVIL) 800 MG tablet Take 800 mg by mouth 4 (four) times daily.  01/10/19  Yes [provider]  Iron-Vitamins (GERITOL PO) Take 1 tablet by mouth daily.   Yes [provider]  ondansetron (ZOFRAN) 4 MG tablet Take 1 tablet (4 mg total) by mouth every 6 (six) hours as needed for nausea. 03/29/19  Yes [place] Eric J, DO  pantoprazole (PROTONIX) 40 MG tablet Take 1 tablet (40 mg total) by mouth daily. 03/30/19  Yes [place] Eric J, DO  polyethylene glycol (MIRALAX / GLYCOLAX) 17 g packet Take 17 g by mouth daily as needed for mild constipation. 03/29/19  Yes [place] Eric J, DO  traMADol (ULTRAM) 50 MG tablet Take 1 tablet (50 mg total) by mouth every 6 (six) hours as needed. 03/25/19  Yes Aviva Signs, MD  venlafaxine XR (EFFEXOR-XR) 75 MG 24 hr capsule Take 75 mg by mouth at bedtime.  02/14/19  Yes [provider]  vitamin C (ASCORBIC ACID) 500 MG tablet Take 500 mg by mouth daily.   Yes [provider]     Family History  Problem Relation Age of Onset   Arthritis Mother    Heart disease Father    Heart disease Sister    Arthritis Brother    Heart disease Sister     Social History   Socioeconomic  History   Marital status: Married    Spouse name: Shanon Brow   Number of children: 3   Years of education: Not on file   Highest education level: Not on file  Occupational History   Not on file  Social Needs   Financial resource strain: Not hard at all   Food insecurity    Worry: Never true    Inability: Never true   Transportation needs    Medical: No    Non-medical: No  Tobacco Use   Smoking status:  Former Smoker    Packs/day: 1.00    Years: 15.00    Pack years: 15.00    Types: Cigarettes    Quit date: 03/11/2009    Years since quitting: 10.1   Smokeless tobacco: Never Used  Substance and Sexual Activity   Alcohol use: Not Currently   Drug use: Never   Sexual activity: Not on file  Lifestyle   Physical activity    Days per week: 0 days    Minutes per session: 0 min   Stress: Only a little  Relationships   Social connections    Talks on phone: Once a week    Gets together: Twice a week    Attends religious service: More than 4 times per year    Active member of club or organization: No    Attends meetings of clubs or organizations: Never    Relationship status: Married  Other Topics Concern   Not on file  Social History Narrative   ** Merged History Encounter **         Review of Systems denies fever,HA, CP, dyspnea, cough, abd pain, N/V or bleeding; she does have LBP, LE weakness/numbness  Vital Signs: BP 132/84 (BP Location: Right Arm)    Pulse 92    Temp 98 F (36.7 C) (Oral)    Resp 16    Ht '5\' 6"'$  (1.676 m)    Wt 205 lb 14.6 oz (93.4 kg)    LMP 05/22/2011    SpO2 97%    BMI 33.23 kg/m   Physical Exam awake/alert; rt ptosis; chest CTA bilat; heart- RRR; abd- soft, obese, +BS,NT; LE weakness/paresthesias, R> L; ecchymoses of UE/LE  Imaging: Ct Head Wo Contrast  Result Date: 04/09/2019 CLINICAL DATA:  Fall EXAM: CT HEAD WITHOUT CONTRAST CT MAXILLOFACIAL WITHOUT CONTRAST TECHNIQUE: Multidetector CT imaging of the head and maxillofacial structures were performed using the standard protocol without intravenous contrast. Multiplanar CT image reconstructions of the maxillofacial structures were also generated. COMPARISON:  None. FINDINGS: CT HEAD FINDINGS Brain: There is no mass, hemorrhage or extra-axial collection. The size and configuration of the ventricles and extra-axial CSF spaces are normal. The brain parenchyma is normal, without evidence of acute or  chronic infarction. Vascular: No hyperdense vessel or unexpected vascular calcification. Skull: The visualized skull base, calvarium and extracranial soft tissues are normal. CT MAXILLOFACIAL FINDINGS Osseous: --Complex facial fracture types: No [place], zygomaticomaxillary complex or nasoorbitoethmoidal fracture. --Simple fracture types: None. --Mandible, hard palate and teeth: No acute abnormality. Orbits: The globes are intact. Normal appearance of the intra- and extraconal fat. Symmetric extraocular muscles. Sinuses: No fluid levels or advanced mucosal thickening. Soft tissues: Normal visualized extracranial soft tissues. IMPRESSION: 1. No acute intracranial abnormality. 2. No maxillofacial fracture. Electronically Signed   By: Ulyses Jarred M.D.   On: 04/09/2019 20:48   Ct Head Wo Contrast  Result Date: 03/27/2019 CLINICAL DATA:  Initial evaluation for right-sided ptosis. EXAM: CT HEAD WITHOUT CONTRAST TECHNIQUE: Contiguous  axial images were obtained from the base of the skull through the vertex without intravenous contrast. COMPARISON:  None. FINDINGS: Brain: Age-related cerebral atrophy with mild chronic small vessel ischemic disease. No acute intracranial hemorrhage. No acute large vessel territory infarct. No mass lesion, midline shift or mass effect. No hydrocephalus. No extra-axial fluid collection. Vascular: No hyperdense vessel. Scattered vascular calcifications noted within the carotid siphons. Skull: Scalp soft tissues and calvarium within normal limits. Sinuses/Orbits: Globes normal soft tissues demonstrate no acute finding. Layering fluid noted within left sphenoid sinus. Paranasal sinuses are otherwise clear. No mastoid effusion. Other: None. IMPRESSION: 1. No acute intracranial abnormality. 2. Mild age-related cerebral atrophy with chronic microvascular ischemic disease. 3. Acute left sphenoid sinusitis. Electronically Signed   By: Jeannine Boga M.D.   On: 03/27/2019 21:40   Mr Jeri Cos  DG Contrast  Result Date: 04/10/2019 CLINICAL DATA:  Bilateral leg weakness. Altered mental status. Metastatic cancer. EXAM: MRI HEAD WITHOUT AND WITH CONTRAST TECHNIQUE: Multiplanar, multiecho pulse sequences of the brain and surrounding structures were obtained without and with intravenous contrast. CONTRAST:  7.5 mL Gadavist COMPARISON:  Head CT 04/09/2019 FINDINGS: The study is mildly motion degraded. Brain: There is no evidence of acute infarct, intracranial hemorrhage, midline shift, or extra-axial fluid collection. The ventricles and sulci are within normal limits for age. No significant cerebral white matter disease is evident. An enhancing extra-axial mass in the left middle cranial fossa involving the sphenoid wing measures 20 x 11 x 19 mm. There is minimal associated mass effect on the left temporal tip without brain edema. The pituitary gland is enlarged with evidence of an approximately 2 cm hypoenhancing sellar mass extending into the posterior aspect of the right cavernous sinus and invading the superior aspect of the clivus on the right. Vascular: Major intracranial vascular flow voids are preserved. Skull and upper cervical spine: Superior clival involvement by the sellar mass. No destructive skull lesion seen elsewhere. Sinuses/Orbits: Unremarkable orbits. Mild mucosal thickening in the sphenoid sinuses. Clear mastoid air cells. Other: None. IMPRESSION: 1. No infarct. 2. 2 cm extra-axial mass in the left middle cranial fossa which may represent a meningioma or metastasis. 3. 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 Electronically Signed   By: Logan Bores M.D.   On: 04/10/2019 10:27   Mr Thoracic Spine W Wo Contrast  Result Date: 04/10/2019 CLINICAL DATA:  Bilateral leg weakness. Altered mental status. Metastatic cancer. EXAM: MRI THORACIC WITHOUT AND WITH CONTRAST TECHNIQUE: Multiplanar and multiecho pulse sequences of the thoracic spine were  obtained without and with intravenous contrast. CONTRAST:  7.5 mL Gadavist COMPARISON:  03/27/2019 FINDINGS: Alignment: Slight right convex curvature of the thoracic spine. No significant listhesis. Vertebrae: Widespread marrow replacement and heterogeneous enhancement throughout the visualized vertebral bodies and posterior elements consistent with known metastases. Pathologic T9 compression fracture with 40% vertebral body height loss, progressed from the prior study. Destructive lesion involving the right-sided posterior elements at T3 and adjacent third rib with epidural extension into the right lateral aspect of the spinal canal and right T3 neural foramen, stable to slightly progressed without significant spinal stenosis. Small volume residual ventral epidural tumor at T9, decreased from prior and resulting in borderline spinal stenosis without cord compression. Tumor involving the left-sided vertebral body and posterior elements at L1, left T12 and L1 neural foramina, and left lateral epidural space in the canal has decreased in size, now measuring 3.8 cm in maximal dimension (previously 4.9 cm), and  the volume of epidural tumor in the canal has greatly decreased without residual spinal stenosis. Metastases involve the sternum with a minimally displaced oblique fracture of the sternal body noted. Cord:  Normal signal and morphology. Paraspinal and other soft tissues: Incomplete imaging of known large posterior left lung and chest wall mass. Extensive left-sided pleural tumor with trace left pleural effusion. 1.3 cm left upper pole renal cyst. Disc levels: Mild multilevel disc bulging and facet arthrosis. No significant spinal stenosis. Moderate right neural foraminal stenosis at T2-3 due to disc and osteophyte. IMPRESSION: 1. Known widespread osseous metastases and left chest wall/posterior lung mass. 2. Decreased epidural tumor at T9 and T12-L1 without associated spinal stenosis. 3. Pathologic T9 compression  fracture with progressive vertebral body height loss. 4. Stable to slightly progressive destructive right-sided lesion at T3 with neural foraminal involvement and epidural extension in the right lateral spinal canal. No associated spinal stenosis. 5. Minimally displaced pathologic sternal body fracture. Electronically Signed   By: Logan Bores M.D.   On: 04/10/2019 10:57   Mr Thoracic Spine W Wo Contrast  Result Date: 03/27/2019 CLINICAL DATA:  Thoracic spine pain for approximately 2 weeks. History of multiple falls. EXAM: MRI THORACIC WITHOUT AND WITH CONTRAST TECHNIQUE: Multiplanar and multiecho pulse sequences of the thoracic spine were obtained without and with intravenous contrast. CONTRAST:  10 cc Gadavist IV. COMPARISON:  None. FINDINGS: MRI THORACIC SPINE FINDINGS Alignment:  Maintained. Vertebrae: There is abnormal marrow signal and enhancement in all imaged vertebral bodies consistent with extensive metastatic disease. Tumor extends into the posterior elements at multiple levels. Mild biconcave compression fracture of L1 demonstrates vertebral body height loss of approximately 35%. There may be a very mild compression fracture of T9. Cord:  Demonstrates normal signal. Paraspinal and other soft tissues: The patient has a mass in the left chest measuring approximately 9 cm transverse by 9 cm AP 10 cm craniocaudal. The lesion extends posteriorly out of the chest 3 ribs into the soft tissues of the left back including left chest wall and left paraspinous muscles. There is destruction of left ribs at the site of the lesion. Disc levels: T9: Tumor in the pedicles and vertebral body extends into the ventral and lateral epidural spaces effacing CSF about the cord. Tumor deposit measures approximately 2.5 cm craniocaudal by 2 cm transverse by 0.7 cm AP. A large tumor deposit extends from the level of the left T12-L1 foramen to the L1-2 disc interspace. This tumor in falls left paraspinous muscles and extends  into the left foramina at T12-L1 and L1-2. It measures approximately 5 cm AP by 4 cm transverse by 5 cm craniocaudal. Tumor deflects the cord to the right and deforms the left side of the cord. It encases the left exiting nerve roots at T12-L1 and L1-2. IMPRESSION: Diffuse osseous metastatic disease secondary to a large left chest mass which extends into the posterior chest wall. Epidural tumor deposits centered at T9 and L1 as described above result in mass effect on the cord and impact exiting nerve roots. These results were called by telephone at the time of interpretation on 03/27/2019 at 1:45 pm to Dr. Derek Jack , who verbally acknowledged these results. Electronically Signed   By: Inge Rise M.D.   On: 03/27/2019 14:18   Mr Lumbar Spine W Wo Contrast  Result Date: 04/10/2019 CLINICAL DATA:  Bilateral leg weakness.  Metastatic cancer. EXAM: MRI LUMBAR SPINE WITHOUT AND WITH CONTRAST TECHNIQUE: Multiplanar and multiecho pulse sequences of the lumbar spine  were obtained without and with intravenous contrast. CONTRAST:  7.5 mL Gadavist COMPARISON:  03/01/2019 FINDINGS: Segmentation:  Standard. Alignment:  Normal. Vertebrae: Widespread marrow replacement and heterogeneous enhancement throughout the spine and visualized sacrum/pelvis consistent with known metastases. Pathologic compression fractures at L1 and L5 with progressive vertebral body height loss now measuring 25% and 55%, respectively. Diminished left-sided epidural tumor at T12-L1 as described on separate thoracic spine MRI. New 1.8 cm focus of dorsal epidural tumor at L3 resulting in mild spinal stenosis. Marked progression of now large volume epidural tumor centered at L5 and extending superiorly to L4 and inferiorly to S1 with severe spinal stenosis and complete effacement of the thecal sac. Extraosseous tumor extension anterior to L5 as well. Progressive destructive sacral tumor with extraosseous tumor extension about the left  sacral ala with involvement of the left S1 neural foramen. Conus medullaris and cauda equina: Conus extends to the L1-2 level. New curvilinear enhancement involving multiple cauda equina nerve roots in the mid upper lumbar spine as well as thin enhancement along the surface of the conus. Paraspinal and other soft tissues: Bilateral renal cysts. Disc levels: Mild disc bulging and mild facet hypertrophy from T12-L1 to L3-4 without stenosis. Severe spinal stenosis from L4-S1 due to tumor. IMPRESSION: 1. Progressive, large volume epidural tumor from L4-S1 with severe spinal stenosis. 2. 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 3. New small volume dorsal epidural tumor at L3 with mild spinal stenosis. 4. L1 and L5 pathologic compression fractures with progressive vertebral body height loss. 5. Progressive destructive left-sided sacral tumor with involvement of the left S1 neural foramen. Electronically Signed   By: Logan Bores M.D.   On: 04/10/2019 11:29   Dg Chest Portable 1 View  Result Date: 04/09/2019 CLINICAL DATA:  Loss of sensation to bilateral lower extremities. Reported history of metastatic carcinoma and mass left posterior chest wall by recent MRI of the thoracic spine. EXAM: PORTABLE CHEST 1 VIEW COMPARISON:  MRI of the thoracic spine on 03/27/2019 FINDINGS: Lobulated soft tissue mass projects over the lateral left chest and is likely causing bony destruction a segment of the left sixth rib. Aeration at the left lung base has improved since the prior chest x-ray. No significant pleural fluid identified. No evidence of pneumothorax. IMPRESSION: Large soft tissue mass of the left chest causing visible bony destruction of the left sixth rib. Electronically Signed   By: Aletta Edouard M.D.   On: 04/09/2019 17:09   Dg Chest Port 1 View  Result Date: 03/27/2019 CLINICAL DATA:  Metastatic cancer EXAM: PORTABLE CHEST 1 VIEW  COMPARISON:  MRI 03/27/2019, chest x-ray 01/17/2011 FINDINGS: Right lung is clear. Diffuse opacity in the left thorax. Borderline cardiomegaly. No pneumothorax. Poorly visible left sixth rib. IMPRESSION: Diffuse opacity within the left thorax, may reflect combination of pleural effusion/disease and underlying lung consolidation or possible mass, given findings on recent spine MRI. Poorly visible left sixth rib, either resected or due to bony destructive change from skeletal metastatic disease. Chest CT is suggested for further evaluation. Electronically Signed   By: Donavan Foil M.D.   On: 03/27/2019 22:38   Vas [place] Lower Extremity Venous (dvt)  Result Date: 04/14/2019  Lower Venous Study Indications: Edema.  Risk Factors: Cancer. Comparison Study: 03/29/19 - Negative for DVT Performing Technologist: Oliver Hum RVT  Examination Guidelines: A complete evaluation includes B-mode imaging, spectral Doppler, color Doppler, and power Doppler as needed of all accessible portions  of each vessel. Bilateral testing is considered an integral part of a complete examination. Limited examinations for reoccurring indications may be performed as noted.  +---------+---------------+---------+-----------+----------+-------+  RIGHT     Compressibility Phasicity Spontaneity Properties Summary  +---------+---------------+---------+-----------+----------+-------+  CFV       Full            Yes       Yes                             +---------+---------------+---------+-----------+----------+-------+  SFJ       Full                                                      +---------+---------------+---------+-----------+----------+-------+  FV Prox   Full                                                      +---------+---------------+---------+-----------+----------+-------+  FV Mid    Full                                                      +---------+---------------+---------+-----------+----------+-------+  FV Distal Full                                                       +---------+---------------+---------+-----------+----------+-------+  PFV       Full                                                      +---------+---------------+---------+-----------+----------+-------+  POP       Full            Yes       Yes                             +---------+---------------+---------+-----------+----------+-------+  PTV       Full                                                      +---------+---------------+---------+-----------+----------+-------+  PERO      Full                                                      +---------+---------------+---------+-----------+----------+-------+   +---------+---------------+---------+-----------+----------+-------+  LEFT      Compressibility Phasicity Spontaneity Properties Summary  +---------+---------------+---------+-----------+----------+-------+  CFV       Full            Yes       Yes                             +---------+---------------+---------+-----------+----------+-------+  SFJ       Full                                                      +---------+---------------+---------+-----------+----------+-------+  FV Prox   Full                                                      +---------+---------------+---------+-----------+----------+-------+  FV Mid    Full                                                      +---------+---------------+---------+-----------+----------+-------+  FV Distal Full                                                      +---------+---------------+---------+-----------+----------+-------+  PFV       Full                                                      +---------+---------------+---------+-----------+----------+-------+  POP       Full            Yes       Yes                             +---------+---------------+---------+-----------+----------+-------+  PTV       Full                                                       +---------+---------------+---------+-----------+----------+-------+  PERO      Full                                                      +---------+---------------+---------+-----------+----------+-------+     Summary: Right: There is no evidence of deep vein thrombosis in the lower extremity. No cystic structure found in the popliteal fossa. Left: There is no evidence of deep vein thrombosis in the lower extremity. No cystic structure found in the popliteal fossa.  *See table(s) above for measurements and observations. Electronically  signed by Curt Jews MD on 04/14/2019 at 11:07:45 AM.    Final    Vas [place] Lower Extremity Venous (dvt)  Result Date: 03/31/2019  Lower Venous Study Indications: Swelling.  Comparison Study: No prior studies. Performing Technologist: Oliver Hum RVT  Examination Guidelines: A complete evaluation includes B-mode imaging, spectral Doppler, color Doppler, and power Doppler as needed of all accessible portions of each vessel. Bilateral testing is considered an integral part of a complete examination. Limited examinations for reoccurring indications may be performed as noted.  +---------+---------------+---------+-----------+----------+-------+  RIGHT     Compressibility Phasicity Spontaneity Properties Summary  +---------+---------------+---------+-----------+----------+-------+  CFV       Full            Yes       Yes                             +---------+---------------+---------+-----------+----------+-------+  SFJ       Full                                                      +---------+---------------+---------+-----------+----------+-------+  FV Prox   Full                                                      +---------+---------------+---------+-----------+----------+-------+  FV Mid    Full                                                      +---------+---------------+---------+-----------+----------+-------+  FV Distal Full                                                       +---------+---------------+---------+-----------+----------+-------+  PFV       Full                                                      +---------+---------------+---------+-----------+----------+-------+  POP       Full            Yes       Yes                             +---------+---------------+---------+-----------+----------+-------+  PTV       Full                                                      +---------+---------------+---------+-----------+----------+-------+  PERO      Full                                                      +---------+---------------+---------+-----------+----------+-------+   +----+---------------+---------+-----------+----------+-------+  LEFT Compressibility Phasicity Spontaneity Properties Summary  +----+---------------+---------+-----------+----------+-------+  CFV  Full            Yes       Yes                             +----+---------------+---------+-----------+----------+-------+     Summary: Right: There is no evidence of deep vein thrombosis in the lower extremity. No cystic structure found in the popliteal fossa. Left: No evidence of common femoral vein obstruction.  *See table(s) above for measurements and observations. Electronically signed by Harold Barban MD on 03/31/2019 at 4:37:49 PM.    Final    Ct Maxillofacial Wo Contrast  Result Date: 04/09/2019 CLINICAL DATA:  Fall EXAM: CT HEAD WITHOUT CONTRAST CT MAXILLOFACIAL WITHOUT CONTRAST TECHNIQUE: Multidetector CT imaging of the head and maxillofacial structures were performed using the standard protocol without intravenous contrast. Multiplanar CT image reconstructions of the maxillofacial structures were also generated. COMPARISON:  None. FINDINGS: CT HEAD FINDINGS Brain: There is no mass, hemorrhage or extra-axial collection. The size and configuration of the ventricles and extra-axial CSF spaces are normal. The brain parenchyma is normal, without evidence of acute or chronic infarction. Vascular: No  hyperdense vessel or unexpected vascular calcification. Skull: The visualized skull base, calvarium and extracranial soft tissues are normal. CT MAXILLOFACIAL FINDINGS Osseous: --Complex facial fracture types: No LeFort, zygomaticomaxillary complex or nasoorbitoethmoidal fracture. --Simple fracture types: None. --Mandible, hard palate and teeth: No acute abnormality. Orbits: The globes are intact. Normal appearance of the intra- and extraconal fat. Symmetric extraocular muscles. Sinuses: No fluid levels or advanced mucosal thickening. Soft tissues: Normal visualized extracranial soft tissues. IMPRESSION: 1. No acute intracranial abnormality. 2. No maxillofacial fracture. Electronically Signed   By: Ulyses Jarred M.D.   On: 04/09/2019 20:48    Labs:  CBC: Recent Labs    04/19/19 0523 04/20/19 0328 04/21/19 0310 04/22/19 0347  WBC 0.4* 0.4* 0.3* 0.4*  HGB 7.0* 8.2* 8.0* 8.2*  HCT 22.4* 24.8* 25.2* 25.2*  PLT 8* 36* 17* <5*    COAGS: Recent Labs    04/09/19 2354  INR 1.1  APTT 31    BMP: Recent Labs    04/13/19 0536 04/14/19 0552 04/15/19 0557 04/18/19 0624  NA 140 140 140 136  K 4.1 4.3 4.2 4.4  CL 107 106 107 101  CO2 23 21* 23 25  GLUCOSE 107* 110* 113* 108*  BUN 22* 24* 24* 22*  CALCIUM 8.8* 8.7* 8.7* 8.6*  CREATININE 0.36* <0.30* <0.30* <0.30*  GFRNONAA >60 NOT CALCULATED NOT CALCULATED NOT CALCULATED  GFRAA >60 NOT CALCULATED NOT CALCULATED NOT CALCULATED    LIVER FUNCTION TESTS: Recent Labs    04/13/19 0536 04/14/19 0552 04/15/19 0557 04/19/19 0523  BILITOT 0.5 0.6 0.5 0.6  AST 50* 44* 41 42*  ALT 74* 69* 68* 71*  ALKPHOS 188* 178* 185* 182*  PROT 6.1* 5.9* 6.0* 5.5*  ALBUMIN 2.6* 2.6* 2.6* 2.5*    TUMOR MARKERS: No results for input(s): AFPTM, CEA, CA199, CHROMGRNA in the last 8760 hours.  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 Risks and benefits of procedure was discussed with the patient  (husband aware of plans) including, but not limited to bleeding, infection, damage to adjacent structures or low yield requiring additional tests.  All of the questions were answered and there is agreement to proceed.  Consent signed and in chart.     Thank you for this interesting consult.  I greatly enjoyed meeting Semira Leshonda Galambos and look forward to participating in their care.  A copy of this report was sent to the requesting provider on this date.  Electronically Signed: D. Rowe Robert, PA-C 04/22/2019, 1:28 PM   I spent a total of 20 minutes in face to face in clinical consultation, greater than 50% of which was counseling/coordinating care for CT guided bone marrow biopsy

## 2019-04-22 NOTE — Progress Notes (Signed)
PROGRESS NOTE    Brandi Rodriguez  DJS:[id number] DOB: [birth year] DOA: 04/09/2019 PCP: Manon Hilding, MD     Brief Narrative:  Brandi Rodriguez is a 58 year old female with a past medical history significant for metastatic lung cancer, with secondary Horner syndrome, chronic respiratory failure requiring 3 L of nasal cannula supplementation and a spinal cord affectation causing urinary retention (Foley dependent) and increased lower extremity weakness. She presented with worsening generalized weakness and inability to walk or transfer.  Patient contacted oncology service who has recommended return to emergency department for further evaluation. Imaging studies demonstrated decrease epidural tumor at T9 and T12-L1 without spinal stenosis.  But there is a findings of progressive large epidural tumor from L4-S1 and severe spinal stenosis.  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  Minimal associated mass-effect appreciated.  Case discussed by Dr. Delton Coombes with Dr. Tammi Klippel (radiation oncologist) and has recommended transfer to [facility] for initiation of radiation treatments for lumbar spine and also the brain.   New events last 24 hours / Subjective: No report of bleeding. Asking to go home, but willing to go to rehab first.   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  Decreased epidural tumor T9 and T12-L1, pathologic T9 compression fracture, minimally displaced pathologic sternal body fracture, stable to slightly progressive destruction right-sided lesion T3 -Continue Decadron -Effexor changed to cymbalta for neuropathic pain control -Radiation therapy on hold due to pancytopenia  Pancytopenia -Acute neutropenia, acute thrombocytopenia, acute on chronic anemia, wbc/plt was normal two weeks ago -Worsening of pancytopenia from progressive cancer, ?XRT treatment side effect   -Appreciated hematology/oncology Dr Lindi Adie recommendation, supportive transfusion to keep hgb > 7, plt >10k -Oncology ordered Granix  -Transfuse platelets today -Oncology to follow, plan for bone marrow biopsy   ESBL UTI, related to chronic indwelling foley catheter, POA   -Foley changed on 6/23.  Completed Merrem  Essential hypertension -D/c norvasc, change to lopressor due to short runs of V Tach on tele.  Continue Lasix  Anxiety/depression -Effexor changed to cymbalta for added neuropathic pain benefit.  Continue Xanax  GERD -Continue PPI  Physical deconditioning and generalized weakness -Physical therapy has evaluated patient and has recommended skilled nursing facility for rehabilitation once medically stable  Obesity  -Body mass index is 32.91 kg/m.   DVT prophylaxis: SCD Code Status: DNR Family Communication: None Disposition Plan: SNF once stable.  Oncology/radiation oncology plan for bone marrow biopsy, radiation therapy once pancytopenia stable    Consultants:   Oncology  Radiation oncology  Procedures:   None   Antimicrobials:  Anti-infectives (From  admission, onward)   Start     Dose/Rate Route Frequency Ordered Stop   04/12/19 1100  meropenem (MERREM) 1 g in sodium chloride 0.9 % 100 mL IVPB     1 g 200 mL/hr over 30 Minutes Intravenous Every 8 hours 04/12/19 1013 04/18/19 1807   04/10/19 0130  cefTRIAXone (ROCEPHIN) 1 g in sodium chloride 0.9 % 100 mL IVPB  Status:  Discontinued     1 g 200 mL/hr over 30 Minutes Intravenous Every 24 hours 04/10/19 0125 04/12/19 1013   04/09/19 1945  cephALEXin (KEFLEX) capsule 500 mg     500 mg Oral  Once 04/09/19 1941 04/09/19 1945       Objective: Vitals:   04/21/19 1350 04/21/19 2013 04/22/19 0644 04/22/19 0800  BP: 134/90 (!) 105/92 (!) 113/94   Pulse: 87 100 87   Resp: '18 19 18   '$ Temp: 98.4 F (36.9 C) 97.7 F (36.5 C) 97.7 F (36.5 C)   TempSrc: Oral Oral Oral   SpO2: 98% 98% 95% 95%  Weight:   93.4 kg   Height:        Intake/Output Summary (Last 24 hours) at 04/22/2019 0922 Last data filed at 04/22/2019 0647 Gross per 24 hour  Intake 360 ml  Output 2100 ml  Net -1740 ml   Filed Weights   04/20/19 0532 04/21/19 0516 04/22/19 0644  Weight: 97.6 kg 94.4 kg 93.4 kg     Examination: General exam: Appears calm and comfortable  Respiratory system: Clear to auscultation. Respiratory effort normal. Cardiovascular system: S1 & S2 heard, RRR. No JVD, murmurs, rubs, gallops or clicks. No pedal edema. Gastrointestinal system: Abdomen is nondistended, soft and nontender. No organomegaly or masses felt. Normal bowel sounds heard. Central nervous system: Alert and oriented Extremities: Symmetric, weak bilaterally 4/5 strength, cool to touch, sensation normal and equal bilaterally  Skin: No rashes, lesions or ulcers Psychiatry: Judgement and insight appear normal. Mood & affect appropriate.    Data Reviewed: I have personally reviewed following labs and imaging studies  CBC: Recent Labs  Lab 04/16/19 0536 04/16/19 2125 04/18/19 0932  04/19/19 0523 04/20/19 0328 04/21/19 0310 04/22/19 0347  WBC 0.6* 0.5* 0.4* 0.4* 0.4* 0.3* 0.4*  NEUTROABS 0.4* 0.3*  --   --   --   --   --   HGB 8.4* 7.8* 7.5* 7.0* 8.2* 8.0* 8.2*  HCT 27.2* 24.5* 24.2* 22.4* 24.8* 25.2* 25.2*  MCV 81.2 81.1 80.7 80.0 81.3 81.6 80.8  PLT 8* 25* 15* 8* 36* 17* <5*   Basic Metabolic Panel: Recent Labs  Lab 04/18/19 0624  NA 136  K 4.4  CL 101  CO2 25  GLUCOSE 108*  BUN 22*  CREATININE <0.30*  CALCIUM 8.6*   GFR: CrCl cannot be calculated (This lab value cannot be used to calculate CrCl because it is not a number: <0.30). Liver Function Tests: Recent Labs  Lab 04/19/19 0523  AST 42*  ALT 71*  ALKPHOS 182*  BILITOT 0.6  PROT 5.5*  ALBUMIN 2.5*   No results for input(s): LIPASE, AMYLASE in the last 168 hours. No results for input(s): AMMONIA in the last 168 hours. Coagulation Profile: No results for input(s): INR, PROTIME in the last 168 hours. Cardiac Enzymes: No results for input(s): CKTOTAL, CKMB, CKMBINDEX, TROPONINI in the last 168 hours. BNP (last 3 results) No results for input(s): PROBNP in the last 8760 hours. HbA1C: No results for input(s): HGBA1C in the last 72 hours. CBG: No results for input(s):  GLUCAP in the last 168 hours. Lipid Profile: No results for input(s): CHOL, HDL, LDLCALC, TRIG, CHOLHDL, LDLDIRECT in the last 72 hours. Thyroid Function Tests: No results for input(s): TSH, T4TOTAL, FREET4, T3FREE, THYROIDAB in the last 72 hours. Anemia Panel: No results for input(s): VITAMINB12, FOLATE, FERRITIN, TIBC, IRON, RETICCTPCT in the last 72 hours. Sepsis Labs: No results for input(s): PROCALCITON, LATICACIDVEN in the last 168 hours.  No results found for this or any previous visit (from the past 240 hour(s)).     Radiology Studies: No results found.    Scheduled Meds: . dexamethasone  4 mg Oral Q6H  . DULoxetine  20 mg Oral BID  . feeding supplement (PRO-STAT SUGAR FREE 64)  30 mL Oral BID  .  furosemide  20 mg Oral Daily  . hydrocortisone   Rectal BID  . metoprolol tartrate  12.5 mg Oral BID  . pantoprazole  40 mg Oral Daily  . senna-docusate  1 tablet Oral BID  . sodium chloride flush  10-40 mL Intracatheter Q12H   Continuous Infusions:    LOS: 12 days    Time spent: 20 minutes   Dessa Phi, DO [facility] [email or web address] 04/22/2019, 9:22 AM

## 2019-04-22 NOTE — Progress Notes (Addendum)
HEMATOLOGY-ONCOLOGY PROGRESS NOTE  SUBJECTIVE: Continues to have pancytopenia. Has received 4 doses of Granix to date. Remains afebrile. No bleeding. Still with right eye ptosis, lower extremity weakness, and numbness. Radiation remains on hold.   REVIEW OF SYSTEMS:   As above all other systems neg  PHYSICAL EXAMINATION: ECOG PERFORMANCE STATUS: 3 - Symptomatic, >50% confined to bed  Vitals:   04/22/19 0644 04/22/19 0800  BP: (!) 113/94   Pulse: 87   Resp: 18   Temp: 97.7 F (36.5 C)   SpO2: 95% 95%   Filed Weights   04/20/19 0532 04/21/19 0516 04/22/19 0644  Weight: 215 lb 2.7 oz (97.6 kg) 208 lb 1.8 oz (94.4 kg) 205 lb 14.6 oz (93.4 kg)   Exam:  General: Appears calm and comfortable  Respiratory system: Clear to auscultation. Respiratory effort normal. Cardiovascular system: S1 & S2 heard, RRR. No JVD, murmurs, rubs, gallops or clicks. No pedal edema. Gastrointestinal system: Abdomen is nondistended, soft and nontender. No organomegaly or masses felt. Normal bowel sounds heard. Neuro: Alert and oriented. LE weakness R>L.  Skin:  Several ecchymotic areas noted on her upper and lower extremities.  LABORATORY DATA:  I have reviewed the data as listed CMP Latest Ref Rng & Units 04/19/2019 04/18/2019 04/15/2019  Glucose 70 - 99 mg/dL - 108(H) 113(H)  BUN 6 - 20 mg/dL - 22(H) 24(H)  Creatinine 0.44 - 1.00 mg/dL - <0.30(L) <0.30(L)  Sodium 135 - 145 mmol/L - 136 140  Potassium 3.5 - 5.1 mmol/L - 4.4 4.2  Chloride 98 - 111 mmol/L - 101 107  CO2 22 - 32 mmol/L - 25 23  Calcium 8.9 - 10.3 mg/dL - 8.6(L) 8.7(L)  Total Protein 6.5 - 8.1 g/dL 5.5(L) - 6.0(L)  Total Bilirubin 0.3 - 1.2 mg/dL 0.6 - 0.5  Alkaline Phos 38 - 126 U/L 182(H) - 185(H)  AST 15 - 41 U/L 42(H) - 41  ALT 0 - 44 U/L 71(H) - 68(H)    Lab Results  Component Value Date   WBC 0.4 (LL) 04/22/2019   HGB 8.2 (L) 04/22/2019   HCT 25.2 (L) 04/22/2019   MCV 80.8 04/22/2019   PLT <5 (LL) 04/22/2019   NEUTROABS 0.3  (L) 04/16/2019    ASSESSMENT AND PLAN: 1. Pancytopenia: Secondary to prior radiation therapy.  CBC from today has been reviewed and white blood cell count remains low at 0.4, hemoglobin 8.2, and platelets <5,000.  She will receive 2 units of platelets today. WBC has not improved despite Granix. Discussed proceeding with a bone marrow biopsy in Interventional Radiology with the patient. She asked that I call her husband and discuss with him. If her husband is agreeable, she will agree. I discussed with Lewie Loron by telephone and he is agreeable. Orders for bone marrow biopsy have been ordered. Will hold any additional Granix until after bone marrow biopsy completed as it may alter the results. Will also add N-plate after bone marrow biopsy is completed.   2. Met Neuroendocrine tumor: Follows Dr.Katragadda and Dr.Manning. Radiation remains on hold secondary to pancytopenia. Radiation oncology is following her chart and will resume radiation when her counts improve.  Mikey Bussing, DNP, AGPCNP-BC, AOCNP  Attending Note  I personally saw the patient, reviewed the chart and examined the patient.  Continuing pancytopenia in spite of Granix injections: We would like to do a bone marrow biopsy to see if there is bone marrow spread of metastatic adenocarcinoma. Continue with Granix injections for now. Platelets and blood as  needed

## 2019-04-23 ENCOUNTER — Ambulatory Visit: Payer: BC Managed Care – PPO

## 2019-04-23 ENCOUNTER — Inpatient Hospital Stay (HOSPITAL_COMMUNITY): Payer: BC Managed Care – PPO

## 2019-04-23 DIAGNOSIS — C969 Malignant neoplasm of lymphoid, hematopoietic and related tissue, unspecified: Secondary | ICD-10-CM | POA: Diagnosis not present

## 2019-04-23 DIAGNOSIS — D61818 Other pancytopenia: Secondary | ICD-10-CM | POA: Diagnosis not present

## 2019-04-23 LAB — CBC WITH DIFFERENTIAL/PLATELET
Abs Immature Granulocytes: 0.02 10*3/uL (ref 0.00–0.07)
Basophils Absolute: 0 10*3/uL (ref 0.0–0.1)
Basophils Relative: 0 %
Eosinophils Absolute: 0 10*3/uL (ref 0.0–0.5)
Eosinophils Relative: 0 %
HCT: 23.5 % — ABNORMAL LOW (ref 36.0–46.0)
Hemoglobin: 7.5 g/dL — ABNORMAL LOW (ref 12.0–15.0)
Immature Granulocytes: 5 %
Lymphocytes Relative: 48 %
Lymphs Abs: 0.2 10*3/uL — ABNORMAL LOW (ref 0.7–4.0)
MCH: 25.7 pg — ABNORMAL LOW (ref 26.0–34.0)
MCHC: 31.9 g/dL (ref 30.0–36.0)
MCV: 80.5 fL (ref 80.0–100.0)
Monocytes Absolute: 0.1 10*3/uL (ref 0.1–1.0)
Monocytes Relative: 14 %
Neutro Abs: 0.1 10*3/uL — ABNORMAL LOW (ref 1.7–7.7)
Neutrophils Relative %: 33 %
Platelets: 11 10*3/uL — CL (ref 150–400)
RBC: 2.92 MIL/uL — ABNORMAL LOW (ref 3.87–5.11)
RDW: 13.8 % (ref 11.5–15.5)
WBC: 0.4 10*3/uL — CL (ref 4.0–10.5)
nRBC: 4.7 % — ABNORMAL HIGH (ref 0.0–0.2)

## 2019-04-23 LAB — BPAM PLATELET PHERESIS
Blood Product Expiration Date: 202007082359
Blood Product Expiration Date: 202007082359
ISSUE DATE / TIME: 202007061100
ISSUE DATE / TIME: 202007061246
Unit Type and Rh: 6200
Unit Type and Rh: 6200

## 2019-04-23 LAB — PREPARE PLATELET PHERESIS
Unit division: 0
Unit division: 0

## 2019-04-23 MED ORDER — TBO-FILGRASTIM 480 MCG/0.8ML ~~LOC~~ SOSY
480.0000 ug | PREFILLED_SYRINGE | Freq: Every day | SUBCUTANEOUS | Status: DC
  Administered 2019-04-23 – 2019-04-24 (×2): 480 ug via SUBCUTANEOUS
  Filled 2019-04-23 (×3): qty 0.8

## 2019-04-23 MED ORDER — SODIUM CHLORIDE 0.9 % IV SOLN
INTRAVENOUS | Status: AC
  Filled 2019-04-23: qty 250

## 2019-04-23 MED ORDER — FENTANYL CITRATE (PF) 100 MCG/2ML IJ SOLN
INTRAMUSCULAR | Status: AC
  Filled 2019-04-23: qty 2

## 2019-04-23 MED ORDER — MIDAZOLAM HCL 2 MG/2ML IJ SOLN
INTRAMUSCULAR | Status: AC
  Filled 2019-04-23: qty 4

## 2019-04-23 MED ORDER — FENTANYL CITRATE (PF) 100 MCG/2ML IJ SOLN
INTRAMUSCULAR | Status: AC | PRN
  Administered 2019-04-23 (×2): 50 ug via INTRAVENOUS

## 2019-04-23 MED ORDER — MIDAZOLAM HCL 2 MG/2ML IJ SOLN
INTRAMUSCULAR | Status: AC | PRN
  Administered 2019-04-23: 1 mg via INTRAVENOUS
  Administered 2019-04-23 (×2): 0.5 mg via INTRAVENOUS

## 2019-04-23 MED ORDER — ROMIPLOSTIM 250 MCG ~~LOC~~ SOLR
1.0000 ug/kg | SUBCUTANEOUS | Status: DC
  Administered 2019-04-23: 95 ug via SUBCUTANEOUS
  Filled 2019-04-23: qty 0.19

## 2019-04-23 MED ORDER — LIDOCAINE HCL (PF) 1 % IJ SOLN
INTRAMUSCULAR | Status: AC | PRN
  Administered 2019-04-23: 10 mL

## 2019-04-23 NOTE — Progress Notes (Signed)
PROGRESS NOTE    Brandi Rodriguez  OIN:[id number] DOB: 1961-09-30 DOA: 04/09/2019 PCP: Manon Hilding, MD     Brief Narrative:  Brandi Rodriguez is a 58 year old female with a past medical history significant for metastatic lung cancer, with secondary Horner syndrome, chronic respiratory failure requiring 3 L of nasal cannula supplementation and a spinal cord affectation causing urinary retention (Foley dependent) and increased lower extremity weakness. She presented with worsening generalized weakness and inability to walk or transfer.  Patient contacted oncology service who has recommended return to emergency department for further evaluation. Imaging studies demonstrated decrease epidural tumor at T9 and T12-L1 without spinal stenosis.  But there is a findings of progressive large epidural tumor from L4-S1 and severe spinal stenosis.  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  Minimal associated mass-effect appreciated.  Case discussed by Dr. Delton Coombes with Dr. Tammi Klippel (radiation oncologist) and has recommended transfer to [facility] for initiation of radiation treatments for lumbar spine and also the brain.  Hospitalization complicated by pancytopenia requiring multiple transfusions of packed red blood cell and platelets.  Patient started on Granix as well.  New events last 24 hours / Subjective: No bleeding reported.  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  Decreased epidural tumor T9 and T12-L1, pathologic T9 compression fracture, minimally displaced pathologic sternal body fracture, stable to slightly progressive destruction right-sided lesion T3 -Continue Decadron -Effexor changed to cymbalta for neuropathic pain control -Radiation therapy on hold due to pancytopenia -SNF placement recommended  Pancytopenia -Acute neutropenia, acute thrombocytopenia, acute on chronic anemia, wbc/plt was normal two weeks ago -Worsening of pancytopenia from progressive cancer, ?XRT treatment side effect   -Appreciated hematology/oncology Dr Lindi Adie recommendation, supportive transfusion to keep hgb > 7, plt >10k -Oncology ordered Granix, N-plate  -Oncology following, bone marrow biopsy completed 7/7 -Continue to monitor CBC closely  ESBL UTI, related to chronic indwelling foley catheter, POA   -Foley changed on 6/23.  Completed Merrem  Essential hypertension -D/c norvasc, change to lopressor due to short runs of V Tach on tele.  Continue Lasix  Anxiety/depression -Effexor changed to cymbalta for added neuropathic pain benefit.  Continue Xanax  GERD -Continue PPI  Physical deconditioning and generalized weakness -Physical therapy has evaluated patient and has recommended skilled nursing facility for rehabilitation once medically stable  Obesity  -Body mass index is 32.91 kg/m.   DVT prophylaxis: SCD Code Status: DNR Family Communication: None Disposition Plan: SNF  once stable.  Oncology/radiation oncology plan for bone marrow biopsy, radiation therapy once pancytopenia stable  Consultants:   Oncology  Radiation oncology  Procedures:   Bone marrow biopsy 7/7  Antimicrobials:  Anti-infectives (From admission, onward)   Start     Dose/Rate Route Frequency Ordered Stop   04/12/19 1100  meropenem (MERREM) 1 g in sodium chloride 0.9 % 100 mL IVPB     1 g 200 mL/hr over 30 Minutes Intravenous Every 8 hours 04/12/19 1013 04/18/19 1807   04/10/19 0130  cefTRIAXone (ROCEPHIN) 1 g in sodium chloride 0.9 % 100 mL IVPB  Status:  Discontinued     1 g 200 mL/hr over 30 Minutes Intravenous Every 24 hours 04/10/19 0125 04/12/19 1013   04/09/19 1945  cephALEXin (KEFLEX) capsule 500 mg     500 mg Oral  Once 04/09/19 1941 04/09/19 1945       Objective: Vitals:   04/23/19 0920 04/23/19 0925 04/23/19 1000 04/23/19 1028  BP: (!) 151/116 (!) 179/97  (!) 144/86  Pulse: (!) 110 (!) 112  98  Resp: _0 Temp:    98.2 F (36.8 C)  TempSrc:    Oral  SpO2: 99% 95% 98% 97%  Weight:    93.4 kg  Height:    5' 5.98" (1.676 m)    Intake/Output Summary (Last 24 hours) at 04/23/2019 1253 Last data filed at 04/23/2019 0655 Gross per 24 hour  Intake 266 ml  Output 1900 ml  Net -1634 ml   Filed Weights   04/21/19 0516 04/22/19 0644 04/23/19 1028  Weight: 94.4 kg 93.4 kg 93.4 kg     Examination: General exam: Appears calm and comfortable  Respiratory system: Clear to auscultation. Respiratory effort normal. Cardiovascular system: S1 & S2 heard, RRR. No JVD, murmurs, rubs, gallops or clicks. No pedal edema. Gastrointestinal system: Abdomen is nondistended, soft and nontender. No organomegaly or masses felt. Normal bowel sounds heard. Central nervous system: Alert and oriented + right eye ptosis Extremities: Symmetric, weak bilaterally Skin: No rashes, lesions or ulcers Psychiatry: Judgement and insight appear normal. Mood & affect appropriate.     Data Reviewed: I have personally reviewed following labs and imaging studies  CBC: Recent Labs  Lab 04/16/19 2125  04/19/19 0523 04/20/19 0328 04/21/19 0310 04/22/19 0347 04/23/19 0533  WBC 0.5*   < > 0.4* 0.4* 0.3* 0.4* 0.4*  NEUTROABS 0.3*  --   --   --   --   --  0.1*  HGB 7.8*   < > 7.0* 8.2* 8.0* 8.2* 7.5*  HCT 24.5*   < > 22.4* 24.8* 25.2* 25.2* 23.5*  MCV 81.1   < > 80.0 81.3 81.6 80.8 80.5  PLT 25*   < > 8* 36* 17* <5* 11*   < > = values in this interval not displayed.   Basic Metabolic Panel: Recent Labs  Lab 04/18/19 0624  NA 136  K 4.4  CL 101  CO2 25  GLUCOSE 108*  BUN 22*  CREATININE <0.30*  CALCIUM 8.6*   GFR: CrCl cannot be calculated (This lab value cannot be used to calculate CrCl because it is not a number: <0.30). Liver Function Tests: Recent Labs  Lab 04/19/19 0523  AST 42*  ALT 71*  ALKPHOS 182*  BILITOT 0.6  PROT 5.5*  ALBUMIN 2.5*   No results for input(s): LIPASE, AMYLASE in the last 168 hours. No results for input(s): AMMONIA in the last 168 hours. Coagulation Profile: No results for input(s): INR, PROTIME in the last 168 hours. Cardiac Enzymes: No results for input(s): CKTOTAL,  CKMB, CKMBINDEX, TROPONINI in the last 168 hours. BNP (last 3 results) No results for input(s): PROBNP in the last 8760 hours. HbA1C: No results for input(s): HGBA1C in the last 72 hours. CBG: No results for input(s): GLUCAP in the last 168 hours. Lipid Profile: No results for input(s): CHOL, HDL, LDLCALC, TRIG, CHOLHDL, LDLDIRECT in the last 72 hours. Thyroid Function Tests: No results for input(s): TSH, T4TOTAL, FREET4, T3FREE, THYROIDAB in the last 72 hours. Anemia Panel: No results for input(s): VITAMINB12, FOLATE, FERRITIN, TIBC, IRON, RETICCTPCT in the last 72 hours. Sepsis Labs: No results for input(s): PROCALCITON, LATICACIDVEN in the last 168 hours.  No results found for this or any previous visit (from the past 240 hour(s)).      Radiology Studies: Ct Biopsy  Result Date: 05-16-2019 INDICATION: 58 year old female with pancytopenia EXAM: CT BONE MARROW BIOPSY AND ASPIRATION; CT BIOPSY MEDICATIONS: None. ANESTHESIA/SEDATION: Moderate (conscious) sedation was employed during this procedure. A total of Versed 2.0 mg and Fentanyl 100 mcg was administered intravenously. Moderate Sedation Time: 13 minutes. The patient's level of consciousness and vital signs were monitored continuously by radiology nursing throughout the procedure under my direct supervision. FLUOROSCOPY TIME:  CT COMPLICATIONS: None PROCEDURE: The procedure risks, benefits, and alternatives were explained to the patient. Questions regarding the procedure were encouraged and answered. The patient understands and consents to the procedure. Scout CT of the pelvis was performed for surgical planning purposes. The right posterior pelvis was prepped with Chlorhexidine in a sterile fashion, and a sterile drape was applied covering the operative field. A sterile gown and sterile gloves were used for the procedure. Local anesthesia was provided with 1% Lidocaine. Posterior right iliac bone was targeted for biopsy. The skin and subcutaneous tissues were infiltrated with 1% lidocaine without epinephrine. A small stab incision was made with an 11 blade scalpel, and an 11 gauge Murphy needle was advanced with CT guidance to the posterior cortex. Manual forced was used to advance the needle through the posterior cortex and the stylet was removed. A bone marrow aspirate was retrieved and passed to a cytotechnologist in the room. The Murphy needle was then advanced without the stylet for a core biopsy. The core biopsy was retrieved and also passed to a cytotechnologist. Manual pressure was used for hemostasis and a sterile dressing was placed. No complications were encountered no significant blood loss was encountered. Patient tolerated the procedure well and remained hemodynamically  stable throughout. IMPRESSION: Status post CT-guided bone marrow biopsy, with tissue specimen sent to pathology for complete histopathologic analysis Signed, Dulcy Fanny. Earleen Newport, DO [facility] Radiology Electronically Signed   By: Corrie Mckusick D.O.   On: 05-16-19 11:50   Ct Bone Marrow Biopsy & Aspiration  Result Date: 05-16-2019 INDICATION: 58 year old female with pancytopenia EXAM: CT BONE MARROW BIOPSY AND ASPIRATION; CT BIOPSY MEDICATIONS: None. ANESTHESIA/SEDATION: Moderate (conscious) sedation was employed during this procedure. A total of Versed 2.0 mg and Fentanyl 100 mcg was administered intravenously. Moderate Sedation Time: 13 minutes. The patient's level of consciousness and vital signs were monitored continuously by radiology nursing throughout the procedure under my direct supervision. FLUOROSCOPY TIME:  CT COMPLICATIONS: None PROCEDURE: The procedure risks, benefits, and alternatives were explained to the patient. Questions regarding the procedure were encouraged and answered. The patient understands and consents to the procedure. Scout CT of the pelvis was performed for surgical planning purposes. The right posterior pelvis was prepped with Chlorhexidine in a sterile fashion, and a sterile drape was  applied covering the operative field. A sterile gown and sterile gloves were used for the procedure. Local anesthesia was provided with 1% Lidocaine. Posterior right iliac bone was targeted for biopsy. The skin and subcutaneous tissues were infiltrated with 1% lidocaine without epinephrine. A small stab incision was made with an 11 blade scalpel, and an 11 gauge Murphy needle was advanced with CT guidance to the posterior cortex. Manual forced was used to advance the needle through the posterior cortex and the stylet was removed. A bone marrow aspirate was retrieved and passed to a cytotechnologist in the room. The Murphy needle was then advanced without  the stylet for a core biopsy. The core biopsy was retrieved and also passed to a cytotechnologist. Manual pressure was used for hemostasis and a sterile dressing was placed. No complications were encountered no significant blood loss was encountered. Patient tolerated the procedure well and remained hemodynamically stable throughout. IMPRESSION: Status post CT-guided bone marrow biopsy, with tissue specimen sent to pathology for complete histopathologic analysis Signed, Dulcy Fanny. Earleen Newport, DO [facility] Radiology Electronically Signed   By: Corrie Mckusick D.O.   On: 04/23/2019 11:50      Scheduled Meds:  dexamethasone  4 mg Oral Q6H   DULoxetine  20 mg Oral BID   feeding supplement (PRO-STAT SUGAR FREE 64)  30 mL Oral BID   fentaNYL       furosemide  20 mg Oral Daily   hydrocortisone   Rectal BID   metoprolol tartrate  12.5 mg Oral BID   midazolam       pantoprazole  40 mg Oral Daily   romiPLOStim  1 mcg/kg Subcutaneous Weekly   senna-docusate  1 tablet Oral BID   sodium chloride flush  10-40 mL Intracatheter Q12H   Tbo-filgastrim (GRANIX) SQ  480 mcg Subcutaneous q1800   Continuous Infusions:  sodium chloride       LOS: 13 days    Time spent: 20 minutes   Dessa Phi, DO [facility] [email or web address] 04/23/2019, 12:53 PM

## 2019-04-23 NOTE — Procedures (Signed)
Interventional Radiology Procedure Note  Procedure: CT guided aspirate and core biopsy of right posterior iliac bone Complications: None Recommendations: - Bedrest supine x 1 hrs - OTC's PRN  Pain - Follow biopsy results  Signed,  Sunday Klos S. Saraann Enneking, DO    

## 2019-04-23 NOTE — Progress Notes (Signed)
Due to [place] of 100 and WBC of 0.4 per Shona Simpson, PA-C radiation treatment will be held again today. [place] 1 notified. Will check patient's labs again tomorrow morning.

## 2019-04-23 NOTE — Progress Notes (Signed)
PT Cancellation Note  Patient Details Name: Brandi Rodriguez MRN: [id number] DOB: 12/01/1960   Cancelled Treatment:    Reason Eval/Treat Not Completed: Patient at procedure or test/unavailable   Maralee Higuchi,KATHrine E 04/23/2019, 9:07 AM Carmelia Bake, PT, DPT Acute Rehabilitation Services Office: [phone] Pager: [phone]

## 2019-04-23 NOTE — Progress Notes (Signed)
Patient was transferred to IR for bone marrow biopsy at 0830.

## 2019-04-23 NOTE — Progress Notes (Addendum)
HEMATOLOGY-ONCOLOGY PROGRESS NOTE  SUBJECTIVE: Had bone marrow biopsy this morning. Still somewhat sedated following procedure. Patient and nursing have not noted any bleeding. Remains afebrile. CBC reviewed and patient continues to have pancytopenia.    REVIEW OF SYSTEMS:   As above all other systems neg  PHYSICAL EXAMINATION: ECOG PERFORMANCE STATUS: 3 - Symptomatic, >50% confined to bed  Vitals:   04/23/19 1000 04/23/19 1028  BP:  (!) 144/86  Pulse:  98  Resp:  18  Temp:  98.2 F (36.8 C)  SpO2: 98% 97%   Filed Weights   04/21/19 0516 04/22/19 0644 04/23/19 1028  Weight: 208 lb 1.8 oz (94.4 kg) 205 lb 14.6 oz (93.4 kg) 205 lb 14.6 oz (93.4 kg)   Exam:  General: Appears calm and comfortable  Respiratory system: Clear to auscultation. Respiratory effort normal. Cardiovascular system: S1 & S2 heard, RRR. No JVD, murmurs, rubs, gallops or clicks. No pedal edema. Gastrointestinal system: Abdomen is nondistended, soft and nontender. No organomegaly or masses felt. Normal bowel sounds heard. Neuro: Alert and oriented. LE weakness R>L.  Skin:  Several ecchymotic areas noted on her upper and lower extremities.  LABORATORY DATA:  I have reviewed the data as listed CMP Latest Ref Rng & Units 04/19/2019 04/18/2019 04/15/2019  Glucose 70 - 99 mg/dL - 108(H) 113(H)  BUN 6 - 20 mg/dL - 22(H) 24(H)  Creatinine 0.44 - 1.00 mg/dL - <0.30(L) <0.30(L)  Sodium 135 - 145 mmol/L - 136 140  Potassium 3.5 - 5.1 mmol/L - 4.4 4.2  Chloride 98 - 111 mmol/L - 101 107  CO2 22 - 32 mmol/L - 25 23  Calcium 8.9 - 10.3 mg/dL - 8.6(L) 8.7(L)  Total Protein 6.5 - 8.1 g/dL 5.5(L) - 6.0(L)  Total Bilirubin 0.3 - 1.2 mg/dL 0.6 - 0.5  Alkaline Phos 38 - 126 U/L 182(H) - 185(H)  AST 15 - 41 U/L 42(H) - 41  ALT 0 - 44 U/L 71(H) - 68(H)    Lab Results  Component Value Date   WBC 0.4 (LL) 04/23/2019   HGB 7.5 (L) 04/23/2019   HCT 23.5 (L) 04/23/2019   MCV 80.5 04/23/2019   PLT 11 (LL) 04/23/2019   NEUTROABS 0.1 (L) 04/23/2019    ASSESSMENT AND PLAN: 1. Pancytopenia: Secondary to prior radiation therapy.  CBC from today has been reviewed and white blood cell count remains low at 0.4, hemoglobin 7.5, and platelets 11,000.  Bone marrow biopsy completed earlier today. Await biopsy results. Will restart patient on Granix 480 mcg daily. Will also give a dose of N-plate 1 mcg/kg today. She does not have any bleeding today. Transfuse for hemoglobin <7.0, platelet count <10,000, or active bleeding. No transfusion indicated today.      2. Met Neuroendocrine tumor: Follows Dr.Katragadda and Dr.Manning. Radiation remains on hold secondary to pancytopenia. Radiation oncology is following her chart and will resume radiation when her counts improve.  Mikey Bussing, DNP, AGPCNP-BC, AOCNP   Attending Note  I personally saw the patient, reviewed the chart and examined the patient.  Persistent pancytopenia: Bone marrow biopsy performed today.  We will see if there is a bone marrow infiltration by malignancy. If there is true involvement of the bone marrow by cancer then she will need systemic chemotherapy for that. Radiation currently on hold for pancytopenia.  Her counts have not improved in spite of holding radiation. Currently receiving G-CSF as well as Nplate starting today. Await the results of bone marrow biopsy for further plans.

## 2019-04-24 ENCOUNTER — Ambulatory Visit: Payer: BC Managed Care – PPO

## 2019-04-24 LAB — CBC WITH DIFFERENTIAL/PLATELET
Abs Immature Granulocytes: 0.06 10*3/uL (ref 0.00–0.07)
Basophils Absolute: 0 10*3/uL (ref 0.0–0.1)
Basophils Relative: 0 %
Eosinophils Absolute: 0 10*3/uL (ref 0.0–0.5)
Eosinophils Relative: 0 %
HCT: 21.7 % — ABNORMAL LOW (ref 36.0–46.0)
Hemoglobin: 7.1 g/dL — ABNORMAL LOW (ref 12.0–15.0)
Immature Granulocytes: 12 %
Lymphocytes Relative: 41 %
Lymphs Abs: 0.2 10*3/uL — ABNORMAL LOW (ref 0.7–4.0)
MCH: 26.4 pg (ref 26.0–34.0)
MCHC: 32.7 g/dL (ref 30.0–36.0)
MCV: 80.7 fL (ref 80.0–100.0)
Monocytes Absolute: 0 10*3/uL — ABNORMAL LOW (ref 0.1–1.0)
Monocytes Relative: 8 %
Neutro Abs: 0.2 10*3/uL — ABNORMAL LOW (ref 1.7–7.7)
Neutrophils Relative %: 39 %
Platelets: 3 10*3/uL — CL (ref 150–400)
RBC: 2.69 MIL/uL — ABNORMAL LOW (ref 3.87–5.11)
RDW: 13.6 % (ref 11.5–15.5)
WBC: 0.5 10*3/uL — CL (ref 4.0–10.5)
nRBC: 5.8 % — ABNORMAL HIGH (ref 0.0–0.2)

## 2019-04-24 MED ORDER — SODIUM CHLORIDE 0.9% IV SOLUTION
Freq: Once | INTRAVENOUS | Status: AC
  Administered 2019-04-24: 11:00:00 via INTRAVENOUS

## 2019-04-24 NOTE — Progress Notes (Signed)
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 She presented with worsening generalized weakness and inability to walk or transfer. Patient contacted oncology service who has recommended return to emergency department for further evaluation. Imaging studies demonstrated findings of progressive large epidural tumor from L4-S1 and severe spinal stenosis. 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 Minimal associated mass-effect appreciated. Case discussed by Dr. Delton Coombes with Dr. Tammi Klippel (radiation oncologist) and has recommended transfer to [facility] for initiation of radiation treatments for lumbar spine and also the brain.  Hospitalization complicated by pancytopenia requiring multiple transfusions of packed red blood cell and platelets.  Patient started on Granix as well. Continues to have pancytopenia, underwent bone marrow biopsy on 04/23/2019.  Followed by oncology, radiation oncology.   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  Patient was started on radiation therapy which is on hold because of pancytopenia.  Continue to work with PT OT with anticipation of rehab.  Pancytopenia: Acute neutropenia, thrombocytopenia, acute on chronic anemia that was thought to be possibly related to radiation treatment.  Patient continues to have low hemoglobin and platelets. Oncology recommended transfusion for hemoglobin less than 7 and platelets less than 10.  Currently remains on Granix and Nplate. Platelets are 3 today, will transfuse 2 units of platelets.  Recheck in the morning.  ESBL UTI related to chronic indwelling Foley catheter present on admission: Finished antibiotic therapy.   DVT prophylaxis: SCDs Code Status: DNR Family Communication: None Disposition Plan: Skilled nursing facility once stable.   Consultants:   Oncology,  Radiation oncology  Procedures:   Bone marrow biopsy, 04/23/2019  Antimicrobials:   None   Subjective: Patient seen and examined.  Complained of weakness.  She complained of some back pain and recently receiving pain medications.  No other overnight events.  Platelets were 3000.  Consented from the patient and transfused 2 units of platelets.  Objective: Vitals:   04/24/19 1149 04/24/19 1316 04/24/19 1346 04/24/19 1347  BP: (!) 133/92 112/81 122/82 122/85  Pulse: 99 97 96 97  Resp: '18 18 18 16  '$ Temp: 98 F (36.7 C) 98 F (36.7 C) 98 F (36.7 C) 98 F (36.7 C)  TempSrc: Oral Oral Oral Oral  SpO2: 97% 97% 97% 96%  Weight:      Height:        Intake/Output Summary (Last 24 hours) at 04/24/2019 1407 Last data filed at 04/24/2019 1346 Gross per 24 hour  Intake 262 ml  Output  1400 ml  Net -1138 ml   Filed Weights   04/22/19 0644 04/22/2019 1028 04/24/19 0509  Weight: 93.4 kg 93.4 kg 91.5 kg    Examination:  General exam: Appears calm and comfortable .   Chronically sick looking but not in any distress. Respiratory system: Clear to auscultation. Respiratory effort normal. Cardiovascular system: S1 & S2 heard, RRR. No JVD, murmurs, rubs, gallops or clicks. No pedal edema. Gastrointestinal system: Abdomen is nondistended, soft and nontender. No organomegaly or masses felt. Normal bowel sounds heard.  Foley catheter with clear urine. Central nervous system: Alert and oriented.  She has ptosis and Horner's syndrome on the right side.  She has symmetrical weakness of both lower extremities. Skin: No rashes, lesions or ulcers Psychiatry: Judgement and insight appear normal. Mood & affect appropriate.     Data Reviewed: I have personally reviewed following labs and imaging studies  CBC: Recent Labs  Lab 04/20/19 0328 04/21/19 0310 04/22/19 0347 05/06/2019 0533 04/24/19 0437  WBC 0.4* 0.3* 0.4* 0.4* 0.5*  NEUTROABS  --   --   --  0.1* 0.2*  HGB 8.2* 8.0* 8.2* 7.5* 7.1*  HCT 24.8* 25.2* 25.2* 23.5* 21.7*  MCV 81.3 81.6 80.8 80.5 80.7  PLT 36* 17* <5* 11* 3*   Basic Metabolic Panel: Recent Labs  Lab 04/18/19 0624  NA 136  K 4.4  CL 101  CO2 25  GLUCOSE 108*  BUN 22*  CREATININE <0.30*  CALCIUM 8.6*   GFR: CrCl cannot be calculated (This lab value cannot be used to calculate CrCl because it is not a number: <0.30). Liver Function Tests: Recent Labs  Lab 04/19/19 0523  AST 42*  ALT 71*  ALKPHOS 182*  BILITOT 0.6  PROT 5.5*  ALBUMIN 2.5*   No results for input(s): LIPASE, AMYLASE in the last 168 hours. No results for input(s): AMMONIA in the last 168 hours. Coagulation Profile: No results for input(s): INR, PROTIME in the last 168 hours. Cardiac Enzymes: No results for input(s): CKTOTAL, CKMB, CKMBINDEX, TROPONINI in the last 168 hours. BNP (last 3 results) No results for input(s): PROBNP in the last 8760 hours. HbA1C: No results for input(s): HGBA1C in the last 72 hours. CBG: No results for input(s): GLUCAP in the  last 168 hours. Lipid Profile: No results for input(s): CHOL, HDL, LDLCALC, TRIG, CHOLHDL, LDLDIRECT in the last 72 hours. Thyroid Function Tests: No results for input(s): TSH, T4TOTAL, FREET4, T3FREE, THYROIDAB in the last 72 hours. Anemia Panel: No results for input(s): VITAMINB12, FOLATE, FERRITIN, TIBC, IRON, RETICCTPCT in the last 72 hours. Sepsis Labs: No results for input(s): PROCALCITON, LATICACIDVEN in the last 168 hours.  No results found for this or any previous visit (from the past 240 hour(s)).       Radiology Studies: Ct Biopsy  Result Date: 04/19/2019 INDICATION: 58 year old female with pancytopenia EXAM: CT BONE MARROW BIOPSY AND ASPIRATION; CT BIOPSY MEDICATIONS: None. ANESTHESIA/SEDATION: Moderate (conscious) sedation was employed during this procedure. A total of Versed 2.0 mg and Fentanyl 100 mcg was administered intravenously. Moderate Sedation Time: 13 minutes. The patient's level of consciousness and vital signs were monitored continuously by radiology nursing throughout the procedure under my direct supervision. FLUOROSCOPY TIME:  CT COMPLICATIONS: None PROCEDURE: The procedure risks, benefits, and alternatives were explained to the patient. Questions regarding the procedure were encouraged and answered. The patient understands and consents to the procedure. Scout CT of the pelvis was performed for surgical planning purposes. The right posterior pelvis was prepped with Chlorhexidine  in a sterile fashion, and a sterile drape was applied covering the operative field. A sterile gown and sterile gloves were used for the procedure. Local anesthesia was provided with 1% Lidocaine. Posterior right iliac bone was targeted for biopsy. The skin and subcutaneous tissues were infiltrated with 1% lidocaine without epinephrine. A small stab incision was made with an 11 blade scalpel, and an 11 gauge Murphy needle was advanced with CT guidance to the posterior cortex. Manual forced was  used to advance the needle through the posterior cortex and the stylet was removed. A bone marrow aspirate was retrieved and passed to a cytotechnologist in the room. The Murphy needle was then advanced without the stylet for a core biopsy. The core biopsy was retrieved and also passed to a cytotechnologist. Manual pressure was used for hemostasis and a sterile dressing was placed. No complications were encountered no significant blood loss was encountered. Patient tolerated the procedure well and remained hemodynamically stable throughout. IMPRESSION: Status post CT-guided bone marrow biopsy, with tissue specimen sent to pathology for complete histopathologic analysis Signed, Dulcy Fanny. Earleen Newport, DO [facility] Electronically Signed   By: Corrie Mckusick D.O.   On: 04/23/2019 11:50   Ct Bone Marrow Biopsy & Aspiration  Result Date: 04/23/2019 INDICATION: 58 year old female with pancytopenia EXAM: CT BONE MARROW BIOPSY AND ASPIRATION; CT BIOPSY MEDICATIONS: None. ANESTHESIA/SEDATION: Moderate (conscious) sedation was employed during this procedure. A total of Versed 2.0 mg and Fentanyl 100 mcg was administered intravenously. Moderate Sedation Time: 13 minutes. The patient's level of consciousness and vital signs were monitored continuously by radiology nursing throughout the procedure under my direct supervision. FLUOROSCOPY TIME:  CT COMPLICATIONS: None PROCEDURE: The procedure risks, benefits, and alternatives were explained to the patient. Questions regarding the procedure were encouraged and answered. The patient understands and consents to the procedure. Scout CT of the pelvis was performed for surgical planning purposes. The right posterior pelvis was prepped with Chlorhexidine in a sterile fashion, and a sterile drape was applied covering the operative field. A sterile gown and sterile gloves were used for the procedure. Local anesthesia was provided  with 1% Lidocaine. Posterior right iliac bone was targeted for biopsy. The skin and subcutaneous tissues were infiltrated with 1% lidocaine without epinephrine. A small stab incision was made with an 11 blade scalpel, and an 11 gauge Murphy needle was advanced with CT guidance to the posterior cortex. Manual forced was used to advance the needle through the posterior cortex and the stylet was removed. A bone marrow aspirate was retrieved and passed to a cytotechnologist in the room. The Murphy needle was then advanced without the stylet for a core biopsy. The core biopsy was retrieved and also passed to a cytotechnologist. Manual pressure was used for hemostasis and a sterile dressing was placed. No complications were encountered no significant blood loss was encountered. Patient tolerated the procedure well and remained hemodynamically stable throughout. IMPRESSION: Status post CT-guided bone marrow biopsy, with tissue specimen sent to pathology for complete histopathologic analysis Signed, Dulcy Fanny. Earleen Newport, DO [facility] Electronically Signed   By: Corrie Mckusick D.O.   On: 04/23/2019 11:50        Scheduled Meds:  dexamethasone  4 mg Oral Q6H   DULoxetine  20 mg Oral BID   feeding supplement (PRO-STAT SUGAR FREE 64)  30 mL Oral BID   furosemide  20 mg Oral Daily   hydrocortisone   Rectal BID  metoprolol tartrate  12.5 mg Oral BID   pantoprazole  40 mg Oral Daily   romiPLOStim  1 mcg/kg Subcutaneous Weekly   senna-docusate  1 tablet Oral BID   sodium chloride flush  10-40 mL Intracatheter Q12H   Tbo-filgastrim (GRANIX) SQ  480 mcg Subcutaneous q1800   Continuous Infusions:   LOS: 14 days    Time spent: 25 minutes.    Barb Merino, MD [facility] Pager [phone]  If 7PM-7AM, please contact night-coverage [email or web address] Password [email or web address] 04/24/2019, 2:07 PM

## 2019-04-24 NOTE — Progress Notes (Signed)
OT Cancellation Note  Patient Details Name: Marylin Lathon MRN: [id number] DOB: 1960/12/06   Cancelled Treatment:    Reason Eval/Treat Not Completed: Medical issues which prohibited therapy; pt currently with platelet level of 3 (previous level was 11). Will hold OT treatment and follow up as able.  Lou Cal, OT [facility] Pager [phone] Office [phone]   Raymondo Band 04/24/2019, 10:05 AM

## 2019-04-24 NOTE — Progress Notes (Signed)
PT Cancellation Note  Patient Details Name: Brandi Rodriguez MRN: [id number] DOB: December 17, 1960   Cancelled Treatment:     per chart review and RN reports pt unable to tolerate   Rica Koyanagi  PTA Acute  Rehabilitation Services Pager      [phone] Office      [phone]

## 2019-04-25 ENCOUNTER — Ambulatory Visit: Payer: BC Managed Care – PPO

## 2019-04-25 LAB — CBC WITH DIFFERENTIAL/PLATELET
Abs Immature Granulocytes: 0.01 10*3/uL (ref 0.00–0.07)
Basophils Absolute: 0 10*3/uL (ref 0.0–0.1)
Basophils Relative: 0 %
Eosinophils Absolute: 0 10*3/uL (ref 0.0–0.5)
Eosinophils Relative: 0 %
HCT: 19.2 % — ABNORMAL LOW (ref 36.0–46.0)
Hemoglobin: 6.3 g/dL — CL (ref 12.0–15.0)
Immature Granulocytes: 2 %
Lymphocytes Relative: 44 %
Lymphs Abs: 0.2 10*3/uL — ABNORMAL LOW (ref 0.7–4.0)
MCH: 26.3 pg (ref 26.0–34.0)
MCHC: 32.8 g/dL (ref 30.0–36.0)
MCV: 80 fL (ref 80.0–100.0)
Monocytes Absolute: 0.1 10*3/uL (ref 0.1–1.0)
Monocytes Relative: 10 %
Neutro Abs: 0.2 10*3/uL — ABNORMAL LOW (ref 1.7–7.7)
Neutrophils Relative %: 44 %
Platelets: 20 10*3/uL — CL (ref 150–400)
RBC: 2.4 MIL/uL — ABNORMAL LOW (ref 3.87–5.11)
RDW: 14 % (ref 11.5–15.5)
WBC: 0.5 10*3/uL — CL (ref 4.0–10.5)
nRBC: 5.8 % — ABNORMAL HIGH (ref 0.0–0.2)

## 2019-04-25 LAB — BPAM PLATELET PHERESIS
Blood Product Expiration Date: 202007092359
Blood Product Expiration Date: 202007102359
ISSUE DATE / TIME: 202007081130
ISSUE DATE / TIME: 202007081323
Unit Type and Rh: 5100
Unit Type and Rh: 7300

## 2019-04-25 LAB — PREPARE PLATELET PHERESIS
Unit division: 0
Unit division: 0

## 2019-04-25 LAB — HEMOGLOBIN AND HEMATOCRIT, BLOOD
HCT: 26.2 % — ABNORMAL LOW (ref 36.0–46.0)
Hemoglobin: 8.7 g/dL — ABNORMAL LOW (ref 12.0–15.0)

## 2019-04-25 LAB — PREPARE RBC (CROSSMATCH)

## 2019-04-25 MED ORDER — SODIUM CHLORIDE 0.9% IV SOLUTION: Freq: Once | INTRAVENOUS | Status: DC

## 2019-04-25 NOTE — Progress Notes (Signed)
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 She presented with worsening generalized weakness and inability to walk or transfer. Patient contacted oncology service who has recommended return to emergency department for further evaluation. Imaging studies demonstrated findings of progressive large epidural tumor from L4-S1 and severe spinal stenosis. 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 Minimal associated mass-effect appreciated. Case discussed by Dr. Delton Coombes with Dr. Tammi Klippel (radiation oncologist) and has recommended transfer to [facility] for initiation of radiation treatments for lumbar spine and also the brain.  Hospitalization complicated by pancytopenia requiring multiple transfusions of packed red blood cell and platelets.  Patient started on Granix as well. Continues to have pancytopenia, underwent bone marrow biopsy on 04/23/2019.  Followed by oncology, radiation oncology.   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 MRI spine with large volume epidural tumor with severe spinal stenosis, now remains on Decadron, Cymbalta.  Patient was started on radiation therapy which is on hold because of pancytopenia. Continue to work with PT OT with anticipation of rehab.  Pancytopenia: Acute neutropenia, thrombocytopenia, acute on chronic anemia that was thought to be possibly related to radiation treatment.  Patient continues to have low hemoglobin and platelets. Oncology recommended transfusion for hemoglobin less than 7 and platelets less than 10.  Currently remains on Granix and Nplate. Getting frequent transfusions.  Received 2 units of platelets 04/24/2019, platelets are 20,000 today.   Hemoglobin 6.7, 1 unit PRBC transfusion ordered today.    ESBL UTI related to chronic indwelling Foley catheter present on admission: Finished antibiotic therapy.  Patient continues to do very poorly.  Bone marrow biopsy pending to look at bone marrow infiltration with cancer.  Followed by oncology and radiation.  Will consult palliative medicine to coordinate care.  With current functional status, she may not be able to tolerate systemic chemotherapy.  DVT prophylaxis: SCDs Code Status: DNR Family Communication: None Disposition Plan: Skilled nursing facility once stable.   Consultants:   Oncology,  Radiation oncology  Procedures:   Bone marrow biopsy, 04/23/2019  Antimicrobials:   None   Subjective: Patient seen and examined.  Drowsy sleepy and feels weak.  No other overnight events.  Consented for blood transfusion.  Objective: Vitals:   04/25/19 0415 04/25/19 0700 04/25/19 1100 04/25/19 1115  BP: 121/89  (!) 88/61 98/77  Pulse: (!) 105  99 95  Resp: 20  (!) 24 (!) 21  Temp: 97.7 F (36.5 C)  98.8 F (37.1 C) 98.7 F (37.1 C)  TempSrc: Oral  Oral Oral  SpO2: 96%  98% 96%  Weight:  92.1 kg    Height:        Intake/Output Summary (Last 24 hours) at 04/25/2019 1131 Last data filed at 04/24/2019  1800 Gross per 24 hour  Intake 476.33 ml  Output 1300 ml  Net -823.67 ml   Filed Weights   04/23/19 1028 04/24/19 0509 04/25/19 0700  Weight: 93.4 kg 91.5 kg 92.1 kg    Examination:  General exam: Appears sick but not in any acute distress.  Multiple ecchymotic area on her arms. Respiratory system: Clear to auscultation. Respiratory effort normal. Cardiovascular system: S1 & S2 heard, RRR. No JVD, murmurs, rubs, gallops or clicks. No pedal edema. Gastrointestinal system: Abdomen is nondistended, soft and nontender. No organomegaly or masses felt. Normal bowel sounds heard.  Foley catheter with clear urine. Central nervous system: Alert and oriented.  She has ptosis and Horner's syndrome on the right side.  She has asymmetrical weakness of both lower extremities.  Right more than left. Skin: No rashes, lesions or ulcers Psychiatry: Judgement and insight appear normal. Mood & affect flat and anxious.    Data Reviewed: I have personally reviewed following labs and imaging studies  CBC: Recent Labs  Lab 04/21/19 0310 04/22/19 0347 04/23/19 0533 04/24/19 0437 04/25/19 0412  WBC 0.3* 0.4* 0.4* 0.5* 0.5*  NEUTROABS  --   --  0.1* 0.2* 0.2*  HGB 8.0* 8.2* 7.5* 7.1* 6.3*  HCT 25.2* 25.2* 23.5* 21.7* 19.2*  MCV 81.6 80.8 80.5 80.7 80.0  PLT 17* <5* 11* 3* 20*   Basic Metabolic Panel: No results for input(s): NA, K, CL, CO2, GLUCOSE, BUN, CREATININE, CALCIUM, MG, PHOS in the last 168 hours. GFR: CrCl cannot be calculated (This lab value cannot be used to calculate CrCl because it is not a number: <0.30). Liver Function Tests: Recent Labs  Lab 04/19/19 0523  AST 42*  ALT 71*  ALKPHOS 182*  BILITOT 0.6  PROT 5.5*  ALBUMIN 2.5*   No results for input(s): LIPASE, AMYLASE in the last 168 hours. No results for input(s): AMMONIA in the last 168 hours. Coagulation Profile: No results for input(s): INR, PROTIME in the last 168 hours. Cardiac Enzymes: No results for input(s):  CKTOTAL, CKMB, CKMBINDEX, TROPONINI in the last 168 hours. BNP (last 3 results) No results for input(s): PROBNP in the last 8760 hours. HbA1C: No results for input(s): HGBA1C in the last 72 hours. CBG: No results for input(s): GLUCAP in the last 168 hours. Lipid Profile: No results for input(s): CHOL, HDL, LDLCALC, TRIG, CHOLHDL, LDLDIRECT in the last 72 hours. Thyroid Function Tests: No results for input(s): TSH, T4TOTAL, FREET4, T3FREE, THYROIDAB in the last 72 hours. Anemia Panel: No results for input(s): VITAMINB12, FOLATE, FERRITIN, TIBC, IRON, RETICCTPCT in the last 72 hours. Sepsis Labs: No results for input(s): PROCALCITON, LATICACIDVEN in the last 168 hours.  No results found for this or any previous visit (from the past 240 hour(s)).       Radiology Studies: No results found.      Scheduled Meds: . sodium chloride   Intravenous Once  . dexamethasone  4 mg Oral Q6H  . DULoxetine  20 mg Oral BID  . feeding supplement (PRO-STAT SUGAR FREE 64)  30 mL Oral BID  . furosemide  20 mg Oral Daily  . hydrocortisone   Rectal BID  . metoprolol tartrate  12.5 mg Oral BID  . pantoprazole  40 mg Oral Daily  . romiPLOStim  1 mcg/kg Subcutaneous Weekly  . senna-docusate  1 tablet Oral BID  .  sodium chloride flush  10-40 mL Intracatheter Q12H  . Tbo-filgastrim (GRANIX) SQ  480 mcg Subcutaneous q1800   Continuous Infusions:   LOS: 15 days    Time spent: 25 minutes.    Barb Merino, MD [facility] Pager [phone]  If 7PM-7AM, please contact night-coverage [email or web address] Password [email or web address] 04/25/2019, 11:31 AM

## 2019-04-25 NOTE — Progress Notes (Signed)
CRITICAL VALUE ALERT  Critical Value:  Hgb 6.3  Date & Time Notied:  04/25/19 2904  Provider Notified: Baltazar Najjar, NP  Orders Received/Actions taken: Orders to transfuse one unit pRBCs

## 2019-04-25 NOTE — Progress Notes (Addendum)
Brief Oncology Note:  Chart and CBC from today have been reviewed.  Bone marrow biopsy results are still pending.  Her total white blood cell count is 0.5 with an ANC of 0.2, hemoglobin is 0.3, and platelet count is 20,000.  The patient has no active bleeding.  Agree with transfusing 1 unit packed red blood cells as already ordered.  Recommend rechecking hemoglobin hematocrit 1 hour posttransfusion and if less than 7.0 transfuse 1 more unit of packed red blood cells.  No need for platelet transfusion today.  We will contact pathology to see if we get a preliminary report on the bone marrow biopsy.  Further recommendations pending the bone marrow biopsy results.  Mikey Bussing, DNP, AGPCNP-BC, AOCNP   Attending Note  I personally saw the patient, reviewed the chart and examined the patient. The plan of care was discussed with the patient and the admitting team.  -Bone marrow biopsy showing evidence of malignancy.  It has the same characteristics as that of the lymph node. 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 Patient is extremely frail and is in no shape or position to tolerate chemotherapy. I discussed with the patient and her husband that this cancer will take her life and that no amount of chemotherapy will be beneficial.  They are in agreement to pursue hospice care. Patient wishes to go home. I instructed her that hospice will discuss the best possible options for her and provide her with necessary equipment to make her end-of-life comfortable. I will discuss this with Dr. Delton Coombes and Dr. Tammi Klippel as well.

## 2019-04-26 ENCOUNTER — Ambulatory Visit: Payer: BC Managed Care – PPO

## 2019-04-26 ENCOUNTER — Ambulatory Visit
Admission: RE | Admit: 2019-04-26 | Discharge: 2019-04-26 | Disposition: A | Discharge status: Home / Self Care | Payer: BC Managed Care – PPO | Source: Ambulatory Visit | Attending: Radiation Oncology | Admitting: Radiation Oncology

## 2019-04-26 DIAGNOSIS — C3432 Malignant neoplasm of lower lobe, left bronchus or lung: Secondary | ICD-10-CM | POA: Insufficient documentation

## 2019-04-26 DIAGNOSIS — Z510 Encounter for antineoplastic radiation therapy: Secondary | ICD-10-CM | POA: Insufficient documentation

## 2019-04-26 DIAGNOSIS — C7951 Secondary malignant neoplasm of bone: Secondary | ICD-10-CM | POA: Insufficient documentation

## 2019-04-26 DIAGNOSIS — C7931 Secondary malignant neoplasm of brain: Secondary | ICD-10-CM | POA: Insufficient documentation

## 2019-04-26 LAB — TYPE AND SCREEN
ABO/RH(D): A POS
Antibody Screen: NEGATIVE
Unit division: 0

## 2019-04-26 LAB — BPAM RBC
Blood Product Expiration Date: 202008032359
ISSUE DATE / TIME: 202007091107
Unit Type and Rh: 6200

## 2019-04-26 MED ORDER — METOPROLOL TARTRATE 5 MG/5ML IV SOLN
5.0000 mg | INTRAVENOUS | Status: DC | PRN
  Administered 2019-04-26: 5 mg via INTRAVENOUS
  Filled 2019-04-26: qty 5

## 2019-04-26 MED ORDER — MORPHINE SULFATE 20 MG/5ML PO SOLN: 5.0000 mg | ORAL | 0 refills | Status: AC | PRN

## 2019-04-26 MED ORDER — ALPRAZOLAM 0.5 MG PO TABS: 0.5000 mg | ORAL_TABLET | Freq: Three times a day (TID) | ORAL | 0 refills | Status: AC | PRN

## 2019-04-26 NOTE — Discharge Summary (Signed)
Physician Discharge Summary  Brandi Rodriguez VQQ:[id number] DOB: [birth year] DOA: 04/09/2019  PCP: Manon Hilding, MD  Admit date: 04/09/2019 Discharge date: 04/26/2019  Admitted From: Home. Disposition: Home with hospice.  Recommendations for Outpatient Follow-up:  1. As directed by home hospice.  Home Health: Home hospice. Equipment/Devices: DME by hospice  Discharge Condition: Serious CODE STATUS: DNR/DNI Diet recommendation: Regular diet as tolerated.  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 She presented with worsening generalized weakness and inability to walk or transfer. Imaging studies demonstrated findings of progressive large epidural tumor from L4-S1 and severe spinal stenosis. 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 Minimal associated mass-effect appreciated.  - Hospitalization complicated by pancytopenia requiring multiple transfusions of packed red blood cell and platelets. Patient started on Granix as well. 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  Severe pancytopenia.  Severe debility, anorexia and cancer related pain.  After detailed and careful discussion with oncology with patient and family, decided to go home with family support and hospice at home. All arrangements were done at home for patient's comfort and she is discharged home.  Prescriptions sent to pharmacy for  liquid morphine and Xanax for symptom management, further prescription will be taken care of by hospice team.  Discharge Diagnoses:  Principal Problem:   Weakness Active Problems:   Metastatic cancer to spine [facility])   Acute lower UTI   Pancytopenia ([place])   Indwelling Foley catheter present   FTT (failure to thrive) in adult   Metastatic cancer [facility])    Discharge Instructions  Discharge Instructions    Diet general   Complete by: As directed    Increase activity slowly   Complete by: As directed      Allergies as of 04/26/2019      Reactions   Hydrocodone Other (See Comments)   Makes heart pound      Medication List    STOP taking these medications   amLODipine 5 MG tablet Commonly known as: NORVASC   dexamethasone 4 MG tablet Commonly known as: DECADRON   GERITOL PO   ibuprofen 800 MG tablet Commonly known as: ADVIL   traMADol 50 MG tablet Commonly known as: Ultram   vitamin C 500 MG tablet Commonly known as: ASCORBIC ACID     TAKE these medications   ALPRAZolam 0.5 MG tablet Commonly known as: XANAX Take 1 tablet (0.5 mg total) by mouth 3 (three) times daily as needed for up to 7 days for anxiety. What changed: when to take this   furosemide 20 MG tablet Commonly known as: LASIX Take 1 tablet (20 mg total) by mouth daily.   morphine 20 MG/5ML solution Take 1.3 mLs (5.2 mg total) by mouth every 2 (two) hours as needed for pain.   ondansetron 4 MG tablet Commonly known as: ZOFRAN Take 1 tablet (4 mg total) by mouth every 6 (six) hours as needed for nausea.   pantoprazole 40 MG tablet Commonly known as: PROTONIX Take 1 tablet (40 mg total) by  mouth daily.   polyethylene glycol 17 g packet Commonly known as: MIRALAX / GLYCOLAX Take 17 g by mouth daily as needed for mild constipation.   venlafaxine XR 75 MG 24 hr capsule Commonly known as: EFFEXOR-XR Take 75 mg by mouth at bedtime.       Allergies  Allergen Reactions  . Hydrocodone Other  (See Comments)    Makes heart pound    Consultations:  Oncology   Procedures/Studies: Ct Head Wo Contrast  Result Date: 04/09/2019 CLINICAL DATA:  Fall EXAM: CT HEAD WITHOUT CONTRAST CT MAXILLOFACIAL WITHOUT CONTRAST TECHNIQUE: Multidetector CT imaging of the head and maxillofacial structures were performed using the standard protocol without intravenous contrast. Multiplanar CT image reconstructions of the maxillofacial structures were also generated. COMPARISON:  None. FINDINGS: CT HEAD FINDINGS Brain: There is no mass, hemorrhage or extra-axial collection. The size and configuration of the ventricles and extra-axial CSF spaces are normal. The brain parenchyma is normal, without evidence of acute or chronic infarction. Vascular: No hyperdense vessel or unexpected vascular calcification. Skull: The visualized skull base, calvarium and extracranial soft tissues are normal. CT MAXILLOFACIAL FINDINGS Osseous: --Complex facial fracture types: No LeFort, zygomaticomaxillary complex or nasoorbitoethmoidal fracture. --Simple fracture types: None. --Mandible, hard palate and teeth: No acute abnormality. Orbits: The globes are intact. Normal appearance of the intra- and extraconal fat. Symmetric extraocular muscles. Sinuses: No fluid levels or advanced mucosal thickening. Soft tissues: Normal visualized extracranial soft tissues. IMPRESSION: 1. No acute intracranial abnormality. 2. No maxillofacial fracture. Electronically Signed   By: Ulyses Jarred M.D.   On: 04/09/2019 20:48   Ct Head Wo Contrast  Result Date: 03/27/2019 CLINICAL DATA:  Initial evaluation for right-sided ptosis. EXAM: CT HEAD WITHOUT CONTRAST TECHNIQUE: Contiguous axial images were obtained from the base of the skull through the vertex without intravenous contrast. COMPARISON:  None. FINDINGS: Brain: Age-related cerebral atrophy with mild chronic small vessel ischemic disease. No acute intracranial hemorrhage. No acute large vessel  territory infarct. No mass lesion, midline shift or mass effect. No hydrocephalus. No extra-axial fluid collection. Vascular: No hyperdense vessel. Scattered vascular calcifications noted within the carotid siphons. Skull: Scalp soft tissues and calvarium within normal limits. Sinuses/Orbits: Globes normal soft tissues demonstrate no acute finding. Layering fluid noted within left sphenoid sinus. Paranasal sinuses are otherwise clear. No mastoid effusion. Other: None. IMPRESSION: 1. No acute intracranial abnormality. 2. Mild age-related cerebral atrophy with chronic microvascular ischemic disease. 3. Acute left sphenoid sinusitis. Electronically Signed   By: Jeannine Boga M.D.   On: 03/27/2019 21:40   Mr Jeri Cos KK Contrast  Result Date: 04/10/2019 CLINICAL DATA:  Bilateral leg weakness. Altered mental status. Metastatic cancer. EXAM: MRI HEAD WITHOUT AND WITH CONTRAST TECHNIQUE: Multiplanar, multiecho pulse sequences of the brain and surrounding structures were obtained without and with intravenous contrast. CONTRAST:  7.5 mL Gadavist COMPARISON:  Head CT 04/09/2019 FINDINGS: The study is mildly motion degraded. Brain: There is no evidence of acute infarct, intracranial hemorrhage, midline shift, or extra-axial fluid collection. The ventricles and sulci are within normal limits for age. No significant cerebral white matter disease is evident. An enhancing extra-axial mass in the left middle cranial fossa involving the sphenoid wing measures 20 x 11 x 19 mm. There is minimal associated mass effect on the left temporal tip without brain edema. The pituitary gland is enlarged with evidence of an approximately 2 cm hypoenhancing sellar mass extending into the posterior aspect of the right cavernous sinus and invading the superior aspect of the  clivus on the right. Vascular: Major intracranial vascular flow voids are preserved. Skull and upper cervical spine: Superior clival involvement by the sellar mass. No  destructive skull lesion seen elsewhere. Sinuses/Orbits: Unremarkable orbits. Mild mucosal thickening in the sphenoid sinuses. Clear mastoid air cells. Other: None. IMPRESSION: 1. No infarct. 2. 2 cm extra-axial mass in the left middle cranial fossa which may represent a meningioma or metastasis. 3. 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 Electronically Signed   By: Logan Bores M.D.   On: 04/10/2019 10:27   Mr Thoracic Spine W Wo Contrast  Result Date: 04/10/2019 CLINICAL DATA:  Bilateral leg weakness. Altered mental status. Metastatic cancer. EXAM: MRI THORACIC WITHOUT AND WITH CONTRAST TECHNIQUE: Multiplanar and multiecho pulse sequences of the thoracic spine were obtained without and with intravenous contrast. CONTRAST:  7.5 mL Gadavist COMPARISON:  03/27/2019 FINDINGS: Alignment: Slight right convex curvature of the thoracic spine. No significant listhesis. Vertebrae: Widespread marrow replacement and heterogeneous enhancement throughout the visualized vertebral bodies and posterior elements consistent with known metastases. Pathologic T9 compression fracture with 40% vertebral body height loss, progressed from the prior study. Destructive lesion involving the right-sided posterior elements at T3 and adjacent third rib with epidural extension into the right lateral aspect of the spinal canal and right T3 neural foramen, stable to slightly progressed without significant spinal stenosis. Small volume residual ventral epidural tumor at T9, decreased from prior and resulting in borderline spinal stenosis without cord compression. Tumor involving the left-sided vertebral body and posterior elements at L1, left T12 and L1 neural foramina, and left lateral epidural space in the canal has decreased in size, now measuring 3.8 cm in maximal dimension (previously 4.9 cm), and the volume of epidural tumor in the canal has greatly decreased without residual spinal stenosis.  Metastases involve the sternum with a minimally displaced oblique fracture of the sternal body noted. Cord:  Normal signal and morphology. Paraspinal and other soft tissues: Incomplete imaging of known large posterior left lung and chest wall mass. Extensive left-sided pleural tumor with trace left pleural effusion. 1.3 cm left upper pole renal cyst. Disc levels: Mild multilevel disc bulging and facet arthrosis. No significant spinal stenosis. Moderate right neural foraminal stenosis at T2-3 due to disc and osteophyte. IMPRESSION: 1. Known widespread osseous metastases and left chest wall/posterior lung mass. 2. Decreased epidural tumor at T9 and T12-L1 without associated spinal stenosis. 3. Pathologic T9 compression fracture with progressive vertebral body height loss. 4. Stable to slightly progressive destructive right-sided lesion at T3 with neural foraminal involvement and epidural extension in the right lateral spinal canal. No associated spinal stenosis. 5. Minimally displaced pathologic sternal body fracture. Electronically Signed   By: Logan Bores M.D.   On: 04/10/2019 10:57   Mr Thoracic Spine W Wo Contrast  Result Date: 03/27/2019 CLINICAL DATA:  Thoracic spine pain for approximately 2 weeks. History of multiple falls. EXAM: MRI THORACIC WITHOUT AND WITH CONTRAST TECHNIQUE: Multiplanar and multiecho pulse sequences of the thoracic spine were obtained without and with intravenous contrast. CONTRAST:  10 cc Gadavist IV. COMPARISON:  None. FINDINGS: MRI THORACIC SPINE FINDINGS Alignment:  Maintained. Vertebrae: There is abnormal marrow signal and enhancement in all imaged vertebral bodies consistent with extensive metastatic disease. Tumor extends into the posterior elements at multiple levels. Mild biconcave compression fracture of L1 demonstrates vertebral body height loss of approximately 35%. There may be a very mild compression fracture of T9. Cord:  Demonstrates normal signal. Paraspinal and  other  soft tissues: The patient has a mass in the left chest measuring approximately 9 cm transverse by 9 cm AP 10 cm craniocaudal. The lesion extends posteriorly out of the chest 3 ribs into the soft tissues of the left back including left chest wall and left paraspinous muscles. There is destruction of left ribs at the site of the lesion. Disc levels: T9: Tumor in the pedicles and vertebral body extends into the ventral and lateral epidural spaces effacing CSF about the cord. Tumor deposit measures approximately 2.5 cm craniocaudal by 2 cm transverse by 0.7 cm AP. A large tumor deposit extends from the level of the left T12-L1 foramen to the L1-2 disc interspace. This tumor in falls left paraspinous muscles and extends into the left foramina at T12-L1 and L1-2. It measures approximately 5 cm AP by 4 cm transverse by 5 cm craniocaudal. Tumor deflects the cord to the right and deforms the left side of the cord. It encases the left exiting nerve roots at T12-L1 and L1-2. IMPRESSION: Diffuse osseous metastatic disease secondary to a large left chest mass which extends into the posterior chest wall. Epidural tumor deposits centered at T9 and L1 as described above result in mass effect on the cord and impact exiting nerve roots. These results were called by telephone at the time of interpretation on 03/27/2019 at 1:45 pm to Dr. Derek Jack , who verbally acknowledged these results. Electronically Signed   By: Inge Rise M.D.   On: 03/27/2019 14:18   Mr Lumbar Spine W Wo Contrast  Result Date: 04/10/2019 CLINICAL DATA:  Bilateral leg weakness.  Metastatic cancer. EXAM: MRI LUMBAR SPINE WITHOUT AND WITH CONTRAST TECHNIQUE: Multiplanar and multiecho pulse sequences of the lumbar spine were obtained without and with intravenous contrast. CONTRAST:  7.5 mL Gadavist COMPARISON:  03/01/2019 FINDINGS: Segmentation:  Standard. Alignment:  Normal. Vertebrae: Widespread marrow replacement and heterogeneous enhancement  throughout the spine and visualized sacrum/pelvis consistent with known metastases. Pathologic compression fractures at L1 and L5 with progressive vertebral body height loss now measuring 25% and 55%, respectively. Diminished left-sided epidural tumor at T12-L1 as described on separate thoracic spine MRI. New 1.8 cm focus of dorsal epidural tumor at L3 resulting in mild spinal stenosis. Marked progression of now large volume epidural tumor centered at L5 and extending superiorly to L4 and inferiorly to S1 with severe spinal stenosis and complete effacement of the thecal sac. Extraosseous tumor extension anterior to L5 as well. Progressive destructive sacral tumor with extraosseous tumor extension about the left sacral ala with involvement of the left S1 neural foramen. Conus medullaris and cauda equina: Conus extends to the L1-2 level. New curvilinear enhancement involving multiple cauda equina nerve roots in the mid upper lumbar spine as well as thin enhancement along the surface of the conus. Paraspinal and other soft tissues: Bilateral renal cysts. Disc levels: Mild disc bulging and mild facet hypertrophy from T12-L1 to L3-4 without stenosis. Severe spinal stenosis from L4-S1 due to tumor. IMPRESSION: 1. Progressive, large volume epidural tumor from L4-S1 with severe spinal stenosis. 2. 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 3. New small volume dorsal epidural tumor at L3 with mild spinal stenosis. 4. L1 and L5 pathologic compression fractures with progressive vertebral body height loss. 5. Progressive destructive left-sided sacral tumor with involvement of the left S1 neural foramen. Electronically Signed   By: Logan Bores M.D.   On: 04/10/2019 11:29   Ct  Biopsy  Result Date: 04/23/2019 INDICATION: 58 year old female with pancytopenia EXAM: CT BONE MARROW BIOPSY AND ASPIRATION; CT BIOPSY MEDICATIONS: None. ANESTHESIA/SEDATION:  Moderate (conscious) sedation was employed during this procedure. A total of Versed 2.0 mg and Fentanyl 100 mcg was administered intravenously. Moderate Sedation Time: 13 minutes. The patient's level of consciousness and vital signs were monitored continuously by radiology nursing throughout the procedure under my direct supervision. FLUOROSCOPY TIME:  CT COMPLICATIONS: None PROCEDURE: The procedure risks, benefits, and alternatives were explained to the patient. Questions regarding the procedure were encouraged and answered. The patient understands and consents to the procedure. Scout CT of the pelvis was performed for surgical planning purposes. The right posterior pelvis was prepped with Chlorhexidine in a sterile fashion, and a sterile drape was applied covering the operative field. A sterile gown and sterile gloves were used for the procedure. Local anesthesia was provided with 1% Lidocaine. Posterior right iliac bone was targeted for biopsy. The skin and subcutaneous tissues were infiltrated with 1% lidocaine without epinephrine. A small stab incision was made with an 11 blade scalpel, and an 11 gauge Murphy needle was advanced with CT guidance to the posterior cortex. Manual forced was used to advance the needle through the posterior cortex and the stylet was removed. A bone marrow aspirate was retrieved and passed to a cytotechnologist in the room. The Murphy needle was then advanced without the stylet for a core biopsy. The core biopsy was retrieved and also passed to a cytotechnologist. Manual pressure was used for hemostasis and a sterile dressing was placed. No complications were encountered no significant blood loss was encountered. Patient tolerated the procedure well and remained hemodynamically stable throughout. IMPRESSION: Status post CT-guided bone marrow biopsy, with tissue specimen sent to pathology for complete histopathologic analysis Signed, Dulcy Fanny. Earleen Newport, DO [facility] Electronically Signed   By: Corrie Mckusick D.O.   On: 04/23/2019 11:50   Dg Chest Portable 1 View  Result Date: 04/09/2019 CLINICAL DATA:  Loss of sensation to bilateral lower extremities. Reported history of metastatic carcinoma and mass left posterior chest wall by recent MRI of the thoracic spine. EXAM: PORTABLE CHEST 1 VIEW COMPARISON:  MRI of the thoracic spine on 03/27/2019 FINDINGS: Lobulated soft tissue mass projects over the lateral left chest and is likely causing bony destruction a segment of the left sixth rib. Aeration at the left lung base has improved since the prior chest x-ray. No significant pleural fluid identified. No evidence of pneumothorax. IMPRESSION: Large soft tissue mass of the left chest causing visible bony destruction of the left sixth rib. Electronically Signed   By: Aletta Edouard M.D.   On: 04/09/2019 17:09   Dg Chest Port 1 View  Result Date: 03/27/2019 CLINICAL DATA:  Metastatic cancer EXAM: PORTABLE CHEST 1 VIEW COMPARISON:  MRI 03/27/2019, chest x-ray 01/17/2011 FINDINGS: Right lung is clear. Diffuse opacity in the left thorax. Borderline cardiomegaly. No pneumothorax. Poorly visible left sixth rib. IMPRESSION: Diffuse opacity within the left thorax, may reflect combination of pleural effusion/disease and underlying lung consolidation or possible mass, given findings on recent spine MRI. Poorly visible left sixth rib, either resected or due to bony destructive change from skeletal metastatic disease. Chest CT is suggested for further evaluation. Electronically Signed   By: Donavan Foil M.D.   On: 03/27/2019 22:38   Ct Bone Marrow Biopsy & Aspiration  Result Date: 04/23/2019 INDICATION: 58 year old female with pancytopenia EXAM: CT BONE MARROW BIOPSY AND ASPIRATION; CT  BIOPSY MEDICATIONS: None. ANESTHESIA/SEDATION: Moderate (conscious) sedation was employed during this procedure. A total of Versed 2.0 mg and Fentanyl 100 mcg  was administered intravenously. Moderate Sedation Time: 13 minutes. The patient's level of consciousness and vital signs were monitored continuously by radiology nursing throughout the procedure under my direct supervision. FLUOROSCOPY TIME:  CT COMPLICATIONS: None PROCEDURE: The procedure risks, benefits, and alternatives were explained to the patient. Questions regarding the procedure were encouraged and answered. The patient understands and consents to the procedure. Scout CT of the pelvis was performed for surgical planning purposes. The right posterior pelvis was prepped with Chlorhexidine in a sterile fashion, and a sterile drape was applied covering the operative field. A sterile gown and sterile gloves were used for the procedure. Local anesthesia was provided with 1% Lidocaine. Posterior right iliac bone was targeted for biopsy. The skin and subcutaneous tissues were infiltrated with 1% lidocaine without epinephrine. A small stab incision was made with an 11 blade scalpel, and an 11 gauge Murphy needle was advanced with CT guidance to the posterior cortex. Manual forced was used to advance the needle through the posterior cortex and the stylet was removed. A bone marrow aspirate was retrieved and passed to a cytotechnologist in the room. The Murphy needle was then advanced without the stylet for a core biopsy. The core biopsy was retrieved and also passed to a cytotechnologist. Manual pressure was used for hemostasis and a sterile dressing was placed. No complications were encountered no significant blood loss was encountered. Patient tolerated the procedure well and remained hemodynamically stable throughout. IMPRESSION: Status post CT-guided bone marrow biopsy, with tissue specimen sent to pathology for complete histopathologic analysis Signed, Dulcy Fanny. Earleen Newport, DO [facility] Electronically Signed   By: Corrie Mckusick D.O.   On: 04/23/2019 11:50    Vas [place] Lower Extremity Venous (dvt)  Result Date: 04/14/2019  Lower Venous Study Indications: Edema.  Risk Factors: Cancer. Comparison Study: 03/29/19 - Negative for DVT Performing Technologist: Oliver Hum RVT  Examination Guidelines: A complete evaluation includes B-mode imaging, spectral Doppler, color Doppler, and power Doppler as needed of all accessible portions of each vessel. Bilateral testing is considered an integral part of a complete examination. Limited examinations for reoccurring indications may be performed as noted.  +---------+---------------+---------+-----------+----------+-------+ RIGHT    CompressibilityPhasicitySpontaneityPropertiesSummary +---------+---------------+---------+-----------+----------+-------+ CFV      Full           Yes      Yes                          +---------+---------------+---------+-----------+----------+-------+ SFJ      Full                                                 +---------+---------------+---------+-----------+----------+-------+ FV Prox  Full                                                 +---------+---------------+---------+-----------+----------+-------+ FV Mid   Full                                                 +---------+---------------+---------+-----------+----------+-------+  FV DistalFull                                                 +---------+---------------+---------+-----------+----------+-------+ PFV      Full                                                 +---------+---------------+---------+-----------+----------+-------+ POP      Full           Yes      Yes                          +---------+---------------+---------+-----------+----------+-------+ PTV      Full                                                 +---------+---------------+---------+-----------+----------+-------+ PERO     Full                                                  +---------+---------------+---------+-----------+----------+-------+   +---------+---------------+---------+-----------+----------+-------+ LEFT     CompressibilityPhasicitySpontaneityPropertiesSummary +---------+---------------+---------+-----------+----------+-------+ CFV      Full           Yes      Yes                          +---------+---------------+---------+-----------+----------+-------+ SFJ      Full                                                 +---------+---------------+---------+-----------+----------+-------+ FV Prox  Full                                                 +---------+---------------+---------+-----------+----------+-------+ FV Mid   Full                                                 +---------+---------------+---------+-----------+----------+-------+ FV DistalFull                                                 +---------+---------------+---------+-----------+----------+-------+ PFV      Full                                                 +---------+---------------+---------+-----------+----------+-------+ POP  Full           Yes      Yes                          +---------+---------------+---------+-----------+----------+-------+ PTV      Full                                                 +---------+---------------+---------+-----------+----------+-------+ PERO     Full                                                 +---------+---------------+---------+-----------+----------+-------+     Summary: Right: There is no evidence of deep vein thrombosis in the lower extremity. No cystic structure found in the popliteal fossa. Left: There is no evidence of deep vein thrombosis in the lower extremity. No cystic structure found in the popliteal fossa.  *See table(s) above for measurements and observations. Electronically signed by Curt Jews MD on 04/14/2019 at 9:07:45 AM.    Final    Vas [place] Lower Extremity  Venous (dvt)  Result Date: 03/31/2019  Lower Venous Study Indications: Swelling.  Comparison Study: No prior studies. Performing Technologist: Oliver Hum RVT  Examination Guidelines: A complete evaluation includes B-mode imaging, spectral Doppler, color Doppler, and power Doppler as needed of all accessible portions of each vessel. Bilateral testing is considered an integral part of a complete examination. Limited examinations for reoccurring indications may be performed as noted.  +---------+---------------+---------+-----------+----------+-------+ RIGHT    CompressibilityPhasicitySpontaneityPropertiesSummary +---------+---------------+---------+-----------+----------+-------+ CFV      Full           Yes      Yes                          +---------+---------------+---------+-----------+----------+-------+ SFJ      Full                                                 +---------+---------------+---------+-----------+----------+-------+ FV Prox  Full                                                 +---------+---------------+---------+-----------+----------+-------+ FV Mid   Full                                                 +---------+---------------+---------+-----------+----------+-------+ FV DistalFull                                                 +---------+---------------+---------+-----------+----------+-------+ PFV      Full                                                 +---------+---------------+---------+-----------+----------+-------+  POP      Full           Yes      Yes                          +---------+---------------+---------+-----------+----------+-------+ PTV      Full                                                 +---------+---------------+---------+-----------+----------+-------+ PERO     Full                                                 +---------+---------------+---------+-----------+----------+-------+    +----+---------------+---------+-----------+----------+-------+ LEFTCompressibilityPhasicitySpontaneityPropertiesSummary +----+---------------+---------+-----------+----------+-------+ CFV Full           Yes      Yes                          +----+---------------+---------+-----------+----------+-------+     Summary: Right: There is no evidence of deep vein thrombosis in the lower extremity. No cystic structure found in the popliteal fossa. Left: No evidence of common femoral vein obstruction.  *See table(s) above for measurements and observations. Electronically signed by Harold Barban MD on 03/31/2019 at 4:37:49 PM.    Final    Ct Maxillofacial Wo Contrast  Result Date: 04/09/2019 CLINICAL DATA:  Fall EXAM: CT HEAD WITHOUT CONTRAST CT MAXILLOFACIAL WITHOUT CONTRAST TECHNIQUE: Multidetector CT imaging of the head and maxillofacial structures were performed using the standard protocol without intravenous contrast. Multiplanar CT image reconstructions of the maxillofacial structures were also generated. COMPARISON:  None. FINDINGS: CT HEAD FINDINGS Brain: There is no mass, hemorrhage or extra-axial collection. The size and configuration of the ventricles and extra-axial CSF spaces are normal. The brain parenchyma is normal, without evidence of acute or chronic infarction. Vascular: No hyperdense vessel or unexpected vascular calcification. Skull: The visualized skull base, calvarium and extracranial soft tissues are normal. CT MAXILLOFACIAL FINDINGS Osseous: --Complex facial fracture types: No [place], zygomaticomaxillary complex or nasoorbitoethmoidal fracture. --Simple fracture types: None. --Mandible, hard palate and teeth: No acute abnormality. Orbits: The globes are intact. Normal appearance of the intra- and extraconal fat. Symmetric extraocular muscles. Sinuses: No fluid levels or advanced mucosal thickening. Soft tissues: Normal visualized extracranial soft tissues. IMPRESSION: 1. No acute  intracranial abnormality. 2. No maxillofacial fracture. Electronically Signed   By: Ulyses Jarred M.D.   On: 04/09/2019 20:48      Subjective: Patient seen and examined on the day of discharge.  She is on room air.  She is mostly sleepy and lethargic.  She has no appetite.  She wants to find out how she can go home as soon as possible.  Her husband and son will help at home and they are waiting for her arrival.   Discharge Exam: Vitals:   04/26/19 0358 04/26/19 0552  BP: 125/87   Pulse: (!) 123 66  Resp: 20   Temp: 98 F (36.7 C)   SpO2: 92%    Vitals:   04/25/19 2209 04/26/19 0358 04/26/19 0552 04/26/19 0700  BP:  125/87    Pulse: 72 (!) 123 66   Resp:  20    Temp:  98 F (36.7 C)    TempSrc:  Oral    SpO2:  92%    Weight:    90.8 kg  Height:        General: Pt is alert, awake, chronically sick looking, lethargic. Cardiovascular: RRR, S1/S2 +, no rubs, no gallops Respiratory: CTA bilaterally, no wheezing, no rhonchi Abdominal: Soft, NT, ND, bowel sounds +, pendulous.  No rigidity or guarding. 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  Both lower extremities are weak, right more than left.    The results of significant diagnostics from this hospitalization (including imaging, microbiology, ancillary and laboratory) are listed below for reference.     Microbiology: No results found for this or any previous visit (from the past 240 hour(s)).   Labs: BNP (last 3 results) No results for input(s): BNP in the last 8760 hours. Basic Metabolic Panel: No results for input(s): NA, K, CL, CO2, GLUCOSE, BUN, CREATININE, CALCIUM, MG, PHOS in the last 168 hours. Liver Function Tests: No results for input(s): AST, ALT, ALKPHOS, BILITOT, PROT, ALBUMIN in the last 168 hours. No results for input(s): LIPASE, AMYLASE in the last 168 hours. No results for input(s): AMMONIA in the last 168 hours. CBC: Recent Labs  Lab 04/21/19 0310  04/22/19 0347 04/23/19 0533 04/24/19 0437 04/25/19 0412 04/25/19 1519  WBC 0.3* 0.4* 0.4* 0.5* 0.5*  --   NEUTROABS  --   --  0.1* 0.2* 0.2*  --   HGB 8.0* 8.2* 7.5* 7.1* 6.3* 8.7*  HCT 25.2* 25.2* 23.5* 21.7* 19.2* 26.2*  MCV 81.6 80.8 80.5 80.7 80.0  --   PLT 17* <5* 11* 3* 20*  --    Cardiac Enzymes: No results for input(s): CKTOTAL, CKMB, CKMBINDEX, TROPONINI in the last 168 hours. BNP: Invalid input(s): POCBNP CBG: No results for input(s): GLUCAP in the last 168 hours. D-Dimer No results for input(s): DDIMER in the last 72 hours. Hgb A1c No results for input(s): HGBA1C in the last 72 hours. Lipid Profile No results for input(s): CHOL, HDL, LDLCALC, TRIG, CHOLHDL, LDLDIRECT in the last 72 hours. Thyroid function studies No results for input(s): TSH, T4TOTAL, T3FREE, THYROIDAB in the last 72 hours.  Invalid input(s): FREET3 Anemia work up No results for input(s): VITAMINB12, FOLATE, FERRITIN, TIBC, IRON, RETICCTPCT in the last 72 hours. Urinalysis    Component Value Date/Time   COLORURINE YELLOW 04/09/2019 1815   APPEARANCEUR HAZY (A) 04/09/2019 1815   LABSPEC 1.017 04/09/2019 1815   PHURINE 6.0 04/09/2019 1815   GLUCOSEU NEGATIVE 04/09/2019 1815   HGBUR SMALL (A) 04/09/2019 1815   BILIRUBINUR NEGATIVE 04/09/2019 1815   KETONESUR 5 (A) 04/09/2019 1815   PROTEINUR NEGATIVE 04/09/2019 1815   NITRITE NEGATIVE 04/09/2019 1815   LEUKOCYTESUR TRACE (A) 04/09/2019 1815   Sepsis Labs Invalid input(s): PROCALCITONIN,  WBC,  LACTICIDVEN Microbiology No results found for this or any previous visit (from the past 240 hour(s)).   Time coordinating discharge: 35 minutes  SIGNED:   Barb Merino, MD  [facility] 04/26/2019, 11:20 AM Pager [phone]

## 2019-04-26 NOTE — Consult Note (Signed)
Consultation Note Date: 04/26/2019   Patient Name: Brandi Rodriguez  DOB: 08-Oct-1961  MRN: [id number]  Age / Sex: 58 y.o., female  PCP: Manon Hilding, MD Referring Physician: Barb Merino, MD  Reason for Consultation: Establishing goals of care and Hospice Evaluation  HPI/Patient Profile: 58 y.o. 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  Patient noted to be extremely frail, not having a functional status to tolerate any chemotherapy agents.  Initial discussions with medical oncology as well as hospital medicine were undertaken on 04-25-2019 with agreements being made to pursue hospice care.  Palliative consultation for hospice arrangements, evaluation.  Call placed and discussed with RN.  Patient reportedly complaining of pain.  Patient frail and weak appearing.  Patient with extremely minimal p.o. intake, eats only applesauce/Cheerios.  She is to be given morphine IV as needed dose for pain management.  Call placed and discussed with patient's husband, son was also present on the line.  Palliative medicine is specialized medical care for people living with serious illness. It focuses on providing relief from the symptoms and stress of a serious illness. The goal is to improve quality of life for both the patient and the family.  Goals of care: Broad aims of medical therapy in relation to the patient's values and preferences. Our aim is to provide medical care aimed at enabling patients to achieve the goals that matter most to them, given the circumstances of their particular medical situation and their constraints.   Patient's husband is thankful for the information he received yesterday from medical staff as mentioned above.  He is anticipating the patient's arrival to home today with home with  hospice.  He is asking about durable medical equipment delivery such as hospital bed, logistics of how things will be arranged.  He is awaiting further directions about the patient being transported home.  I gave him some information about home with hospice arrangements.  Discussed with him that hospice liaison's as well as care managers will further coordinate with him.  He is in full understanding of the patient's current condition and overall situation.  Prognosis appears to be not more than a few weeks at this point.  Agree with home with hospice.  NEXT OF KIN  husband and son.   SUMMARY OF RECOMMENDATIONS    Agree with DNR DNI  Agree with home with hospice Discussed with bedside RN about pain management and patient's current condition Discussed with husband and son. They are awaiting the patient's arrival today, with home with hospice, as per their discussions with med onc MD and [facility] MD on 04-25-2019. They are asking about DME such as a hospital bed being arranged for.  Will request care manager consult to help facilitate appropriate DME arrangements, home with hospice arrangements.  Family anticipating patient arriving home today with hospice arrangements.   Code Status/Advance Care Planning:  DNR    Symptom Management:  as above.   Palliative Prophylaxis:   Delirium Protocol  Additional Recommendations (Limitations, Scope, Preferences):  Full Comfort Care  Psycho-social/Spiritual:   Desire for further Chaplaincy support:yes  Additional Recommendations: Education on Hospice  Prognosis:   < 6 weeks  Discharge Planning: Home with Hospice         Primary Diagnoses: Present on Admission: . Acute lower UTI . Pancytopenia ([place]) . Metastatic cancer to spine [facility])   I have reviewed the medical record, interviewed the patient and family, and examined the patient. The following aspects are pertinent.  Past Medical History:  Diagnosis Date  . Arthritis    Social  History   Socioeconomic History  . Marital status: Married    Spouse name: Shanon Brow  . Number of children: 3  . Years of education: Not on file  . Highest education level: Not on file  Occupational History  . Not on file  Social Needs  . Financial resource strain: Not hard at all  . Food insecurity    Worry: Never true    Inability: Never true  . Transportation needs    Medical: No    Non-medical: No  Tobacco Use  . Smoking status: Former Smoker    Packs/day: 1.00    Years: 15.00    Pack years: 15.00    Types: Cigarettes    Quit date: 03/11/2009    Years since quitting: 10.1  . Smokeless tobacco: Never Used  Substance and Sexual Activity  . Alcohol use: Not Currently  . Drug use: Never  . Sexual activity: Not on file  Lifestyle  . Physical activity    Days per week: 0 days    Minutes per session: 0 min  . Stress: Only a little  Relationships  . Social Herbalist on phone: Once a week    Gets together: Twice a week    Attends religious service: More than 4 times per year    Active member of club or organization: No    Attends meetings of clubs or organizations: Never    Relationship status: Married  Other Topics Concern  . Not on file  Social History Narrative   ** Merged History Encounter **       Family History  Problem Relation Age of Onset  . Arthritis Mother   . Heart disease Father   . Heart disease Sister   . Arthritis Brother   . Heart disease Sister    Scheduled Meds: . sodium chloride   Intravenous Once  . dexamethasone  4 mg Oral Q6H  . DULoxetine  20 mg Oral BID  . feeding supplement (PRO-STAT SUGAR FREE 64)  30 mL Oral BID  . furosemide  20 mg Oral Daily  . hydrocortisone   Rectal BID  . metoprolol tartrate  12.5 mg Oral BID  . pantoprazole  40 mg Oral Daily  . senna-docusate  1 tablet Oral BID  . sodium chloride flush  10-40 mL Intracatheter Q12H   Continuous Infusions: PRN Meds:.acetaminophen, ALPRAZolam, metoprolol tartrate,  morphine injection, ondansetron, polyethylene glycol, sodium chloride flush, traMADol Medications Prior to Admission:  Prior to Admission medications   Medication Sig Start Date End Date Taking? Authorizing Provider  ALPRAZolam Duanne Moron) 0.5 MG tablet Take 0.5 mg by mouth 2 (two) times daily as needed for anxiety.  02/16/19  Yes [provider]  amLODipine (NORVASC) 5 MG tablet Take 1 tablet (5 mg total) by mouth daily. 03/29/19 03/28/20 Yes [place] Donnamarie Poag, DO  furosemide (LASIX) 20 MG tablet Take 1 tablet (20 mg total) by mouth daily. 04/05/19  Yes Tyler Pita, MD  ibuprofen (ADVIL) 800 MG tablet Take 800 mg by mouth 4 (four) times daily.  01/10/19  Yes [provider]  Iron-Vitamins (GERITOL PO) Take 1 tablet by mouth daily.   Yes [provider]  ondansetron (ZOFRAN) 4 MG tablet Take 1 tablet (4 mg total) by mouth every 6 (six) hours as needed for nausea. 03/29/19  Yes [place] Eric J, DO  pantoprazole (PROTONIX) 40 MG tablet Take 1 tablet (40 mg total) by mouth daily. 03/30/19  Yes [place] Eric J, DO  polyethylene glycol (MIRALAX / GLYCOLAX) 17 g packet Take 17 g by mouth daily as needed for mild constipation. 03/29/19  Yes [place] Eric J, DO  traMADol (ULTRAM) 50 MG tablet Take 1 tablet (50 mg total) by mouth every 6 (six) hours as needed. 03/25/19  Yes Aviva Signs, MD  venlafaxine XR (EFFEXOR-XR) 75 MG 24 hr capsule Take 75 mg by mouth at bedtime.  02/14/19  Yes [provider]  vitamin C (ASCORBIC ACID) 500 MG tablet Take 500 mg by mouth daily.   Yes [provider]   Allergies  Allergen Reactions  . Hydrocodone Other (See Comments)    Makes heart pound   Review of Systems Noted to be complaining of pain.   Physical Exam As per discussions with RN, patient is resting in bed Complains of generalized pain No acute shortness of breath noted Frail and weak appearing lady  Vital Signs: BP 125/87 (BP Location: Right Wrist)   Pulse 66   Temp 98  F (36.7 C) (Oral)   Resp 20   Ht 5' 5.98" (1.676 m)   Wt 90.8 kg   LMP 05/22/2011   SpO2 92%   BMI 32.33 kg/m  Pain Scale: 0-10 POSS *See Group Information*: S-Acceptable,Sleep, easy to arouse Pain Score: Asleep   SpO2: SpO2: 92 % O2 Device:SpO2: 92 % O2 Flow Rate: .O2 Flow Rate (L/min): 0 L/min  IO: Intake/output summary:   Intake/Output Summary (Last 24 hours) at 04/26/2019 0958 Last data filed at 04/26/2019 1610 Gross per 24 hour  Intake -  Output 800 ml  Net -800 ml    LBM: Last BM Date: 04/22/19 Baseline Weight: Weight: 100.7 kg Most recent weight: Weight: 90.8 kg     Palliative Assessment/Data:    The above conversation was completed via telephone due to the visitor restrictions during the COVID-19 pandemic. Thorough chart review and discussion with necessary members of the care team was completed as part of assessment. All issues were discussed and addressed but no physical exam was performed.  Time In: 8.40 Time Out: 9.40  Time Total: 60 min  Greater than 50%  of this time was spent counseling and coordinating care related to the above assessment and plan.  Signed by: Loistine Chance, MD [phone]   Please contact Palliative Medicine Team phone at [phone] for questions and concerns.  For individual provider: See Shea Evans

## 2019-04-26 NOTE — TOC Progression Note (Addendum)
Transition of Care [facility]) - Progression Note    Patient Details  Name: Vickee Mormino MRN: [id number] Date of Birth: January 14, 1961  Transition of Care [facility]) CM/SW Contact  Leeroy Cha, RN Phone Number: 04/26/2019, 10:24 AM  Clinical Narrative:    [facility] [place]/family called concerning home hospice/she will need equipment/Cassandra will call back with update/order for cm involvement in system 1131 tct-Cassandra/informed of dc will call back with plans for equipment arrival.  :Patient will go home via ptar.  Expected Discharge Plan: Skilled Nursing Facility Barriers to Discharge: Insurance Authorization  Expected Discharge Plan and Services Expected Discharge Plan: [place]       Living arrangements for the past 2 months: Single Family Home                                       Social Determinants of Health (SDOH) Interventions    Readmission Risk Interventions No flowsheet data found.

## 2019-04-26 NOTE — Progress Notes (Signed)
RN paged provider about editing medical necessity form for transport.  Lorra Hals, NP gave verbal permission for RN to edit medical necessity form.  RN removed medical necessity for cardiac monitoring.  RN attempted to give pt po tramadol for pain, pt unable to take po med due to decreased LOC.  PTAR left w/ pt.

## 2019-04-28 DIAGNOSIS — R590 Localized enlarged lymph nodes: Secondary | ICD-10-CM | POA: Diagnosis not present

## 2019-04-29 ENCOUNTER — Ambulatory Visit: Payer: BC Managed Care – PPO

## 2019-04-29 DIAGNOSIS — Z7189 Other specified counseling: Secondary | ICD-10-CM | POA: Insufficient documentation

## 2019-04-29 DIAGNOSIS — Z515 Encounter for palliative care: Secondary | ICD-10-CM | POA: Insufficient documentation

## 2019-04-30 ENCOUNTER — Ambulatory Visit: Payer: BC Managed Care – PPO

## 2019-04-30 ENCOUNTER — Encounter (HOSPITAL_COMMUNITY): Payer: Self-pay | Admitting: Hematology

## 2019-05-01 ENCOUNTER — Ambulatory Visit: Payer: BC Managed Care – PPO

## 2019-05-02 ENCOUNTER — Ambulatory Visit: Payer: BC Managed Care – PPO

## 2019-05-03 ENCOUNTER — Ambulatory Visit: Payer: BC Managed Care – PPO

## 2019-05-06 ENCOUNTER — Ambulatory Visit: Payer: BC Managed Care – PPO

## 2019-05-07 ENCOUNTER — Ambulatory Visit: Payer: BC Managed Care – PPO

## 2019-05-08 ENCOUNTER — Ambulatory Visit: Payer: BC Managed Care – PPO

## 2019-05-15 ENCOUNTER — Ambulatory Visit: Payer: BC Managed Care – PPO | Admitting: Urology

## 2019-05-18 DEATH — deceased

## 2019-05-22 ENCOUNTER — Ambulatory Visit: Payer: Self-pay | Admitting: Urology

## 2019-05-31 ENCOUNTER — Encounter: Payer: Self-pay | Admitting: Radiation Oncology

## 2019-05-31 NOTE — Progress Notes (Signed)
  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 03/28/2019 - 04/12/2019 2. 04/12/2019  Site/dose:    1. T9, L1 including left chest wall / 30 Gy in 10 fractions 2. (a) L3-S1 / received 3 Gy out of planned 30 Gy  (b) Skull Base / received 3 Gy out of planned 30 Gy  Beams/energy:    1. 3D, photons / 10X, 15X 2.  (a) Complex Isodose, photons / 15X, 10X  (b) Complex Isodose, photons / 6X  Narrative: The patient's treatment was complicated by hospital admission on 04/09/2019. Unfortunately, her health declined and her radiation treatments were stopped.  Plan: Unfortunately, the patient's health declined, and she was unable to resume her radiation treatments prior to her passing away on 2019/05/17.  ________________________________  Sheral Apley. Tammi Klippel, M.D.   This document serves as a record of services personally performed by Tyler Pita, MD. It was created on his behalf by Wilburn Mylar, a trained medical scribe. The creation of this record is based on the scribe's personal observations and the provider's statements to them. This document has been checked and approved by the attending provider.

## 2020-05-04 IMAGING — MR MRI LUMBAR SPINE WITHOUT AND WITH CONTRAST
4 of 7 series · 13 of 48 positions shown · IV contrast (gadavist)
Comparison: 03/01/2019

CLINICAL DATA: Bilateral leg weakness.  Metastatic cancer.

EXAM:
MRI LUMBAR SPINE WITHOUT AND WITH CONTRAST
TECHNIQUE: Multiplanar and multiecho pulse sequences of the lumbar spine were
obtained without and with intravenous contrast.
CONTRAST:  7.5 mL Gadavist

[Series 2: T1 · sagittal · 4.0mm · 0.41mm/px · 3 of 17 slices shown (1 of 2)]
[im 1/17]
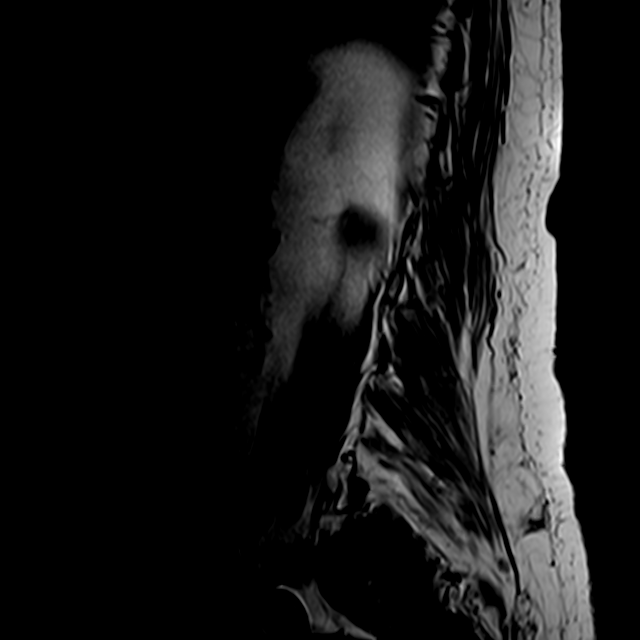
[im 9/17]
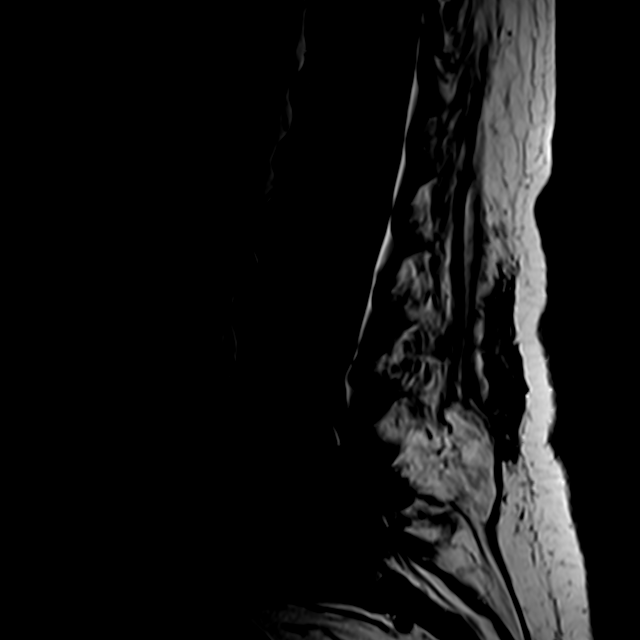
[im 17/17]
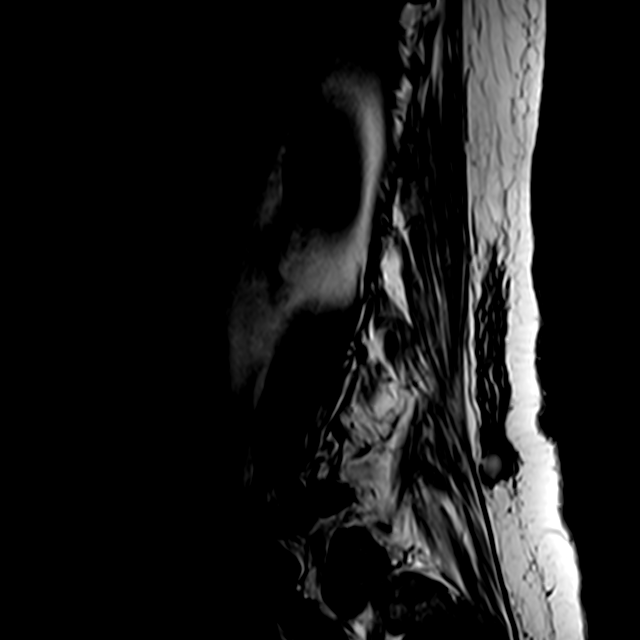

[Series 4: T2 · axial · 4.0mm · 0.29mm/px · z∈[-660,-491]mm · 4 of 45 slices shown (1 of 2)]
[im 1/45]
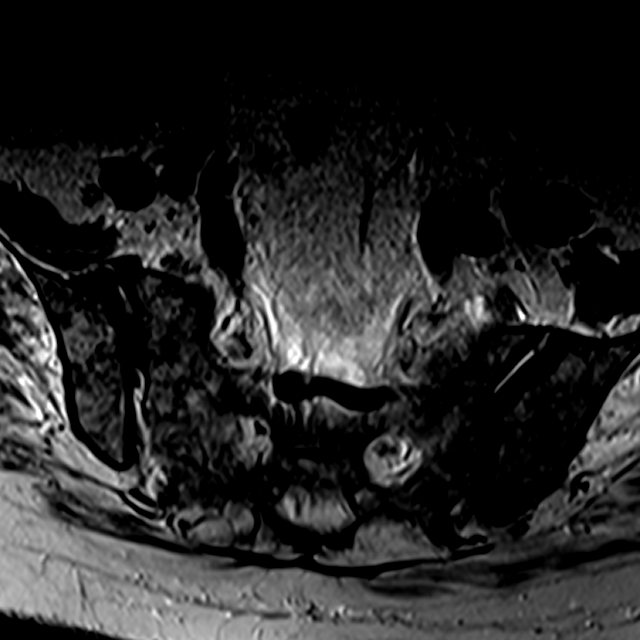
[im 5/45]
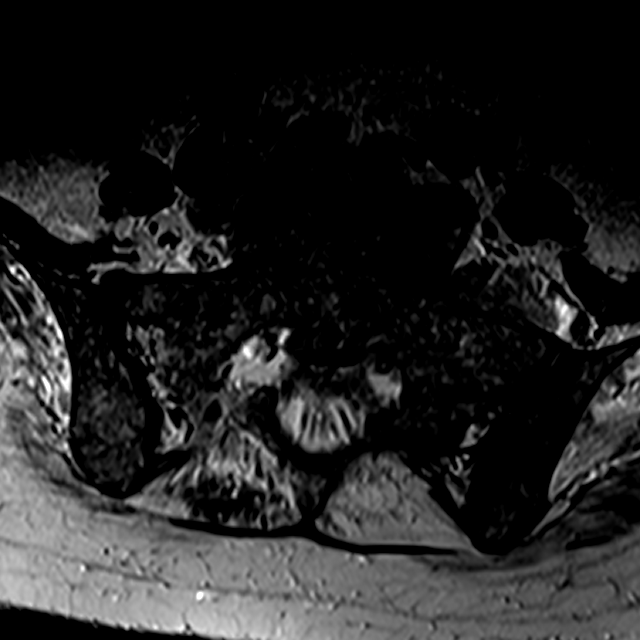
[im 23/45]
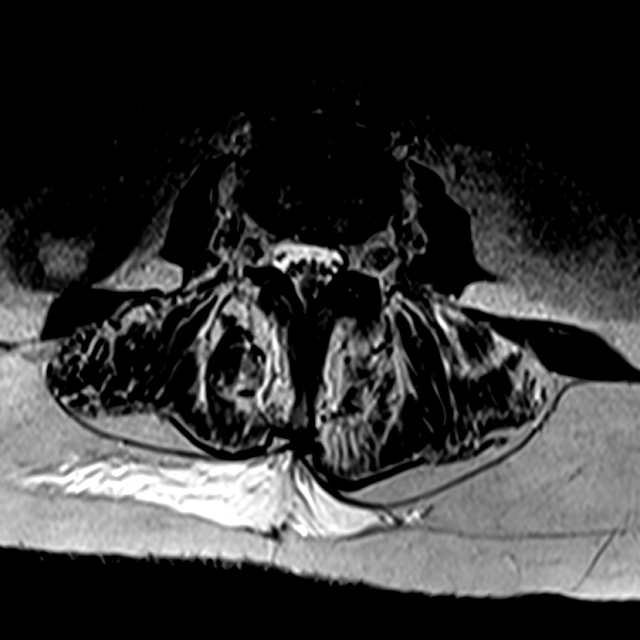
[im 40/45]
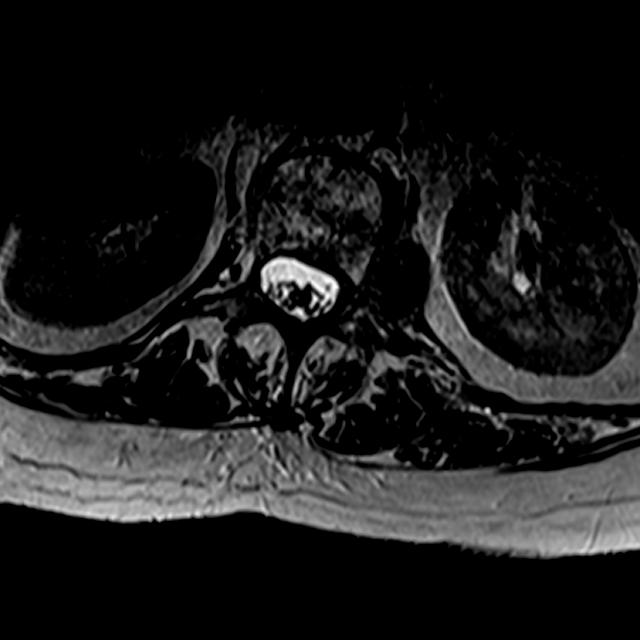

[Series 5: T1 · axial · 4.0mm · 0.29mm/px · z∈[-644,-492]mm · 3 of 45 slices shown (2 of 2)]
[im 5/45]
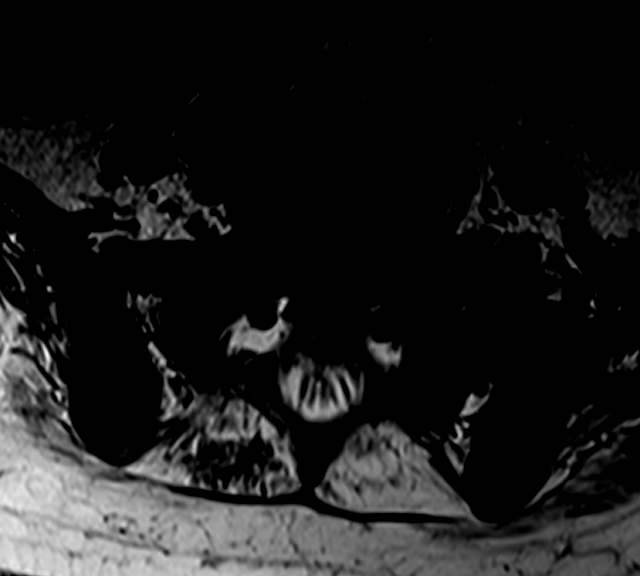
[im 23/45]
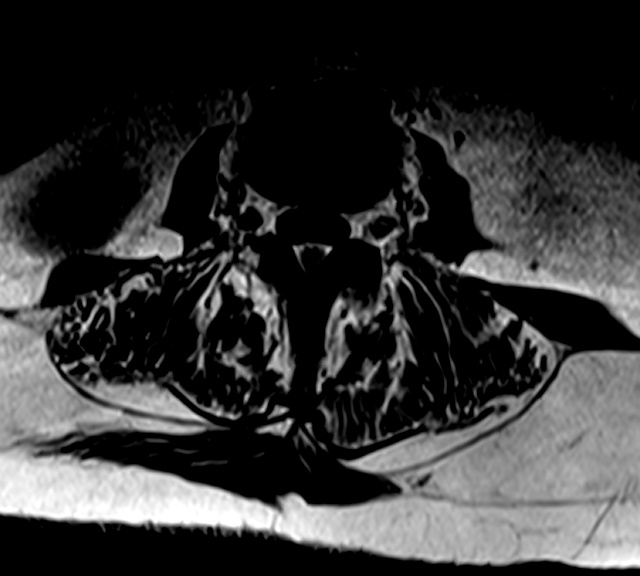
[im 40/45]
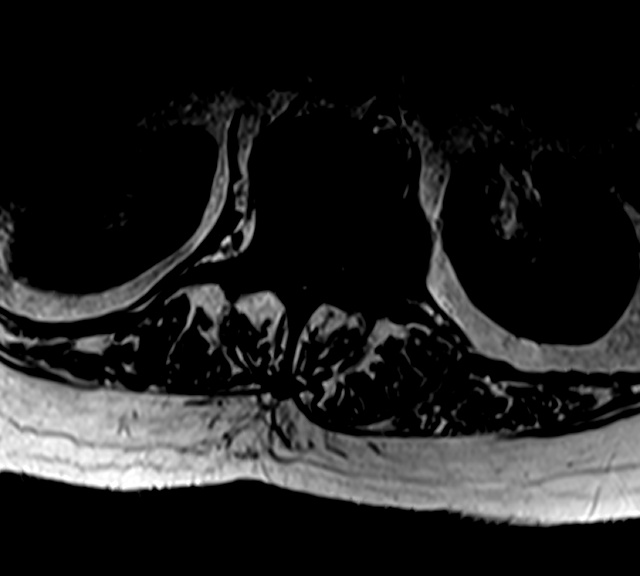

[Series 6: T2 · sagittal · 4.0mm · 0.38mm/px · 3 of 17 slices shown (2 of 2)]
[im 1/17]
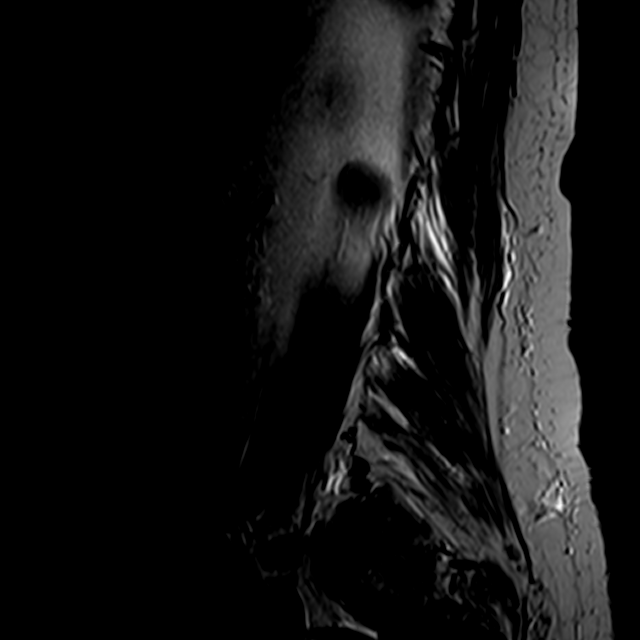
[im 11/17]
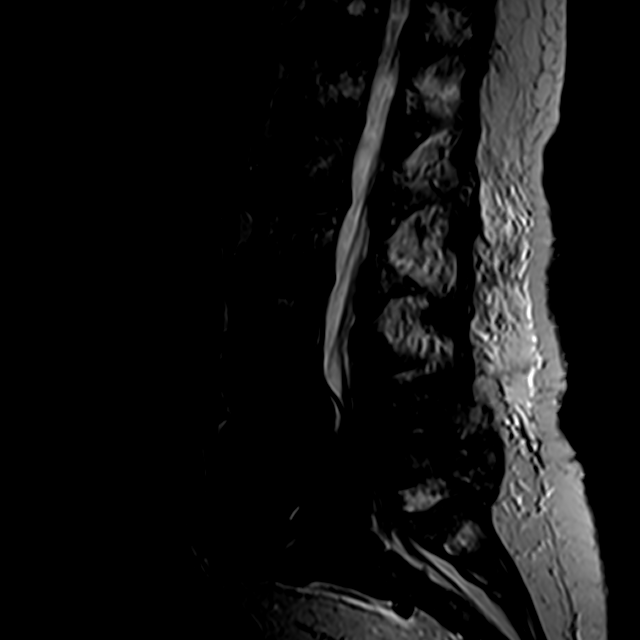
[im 17/17]
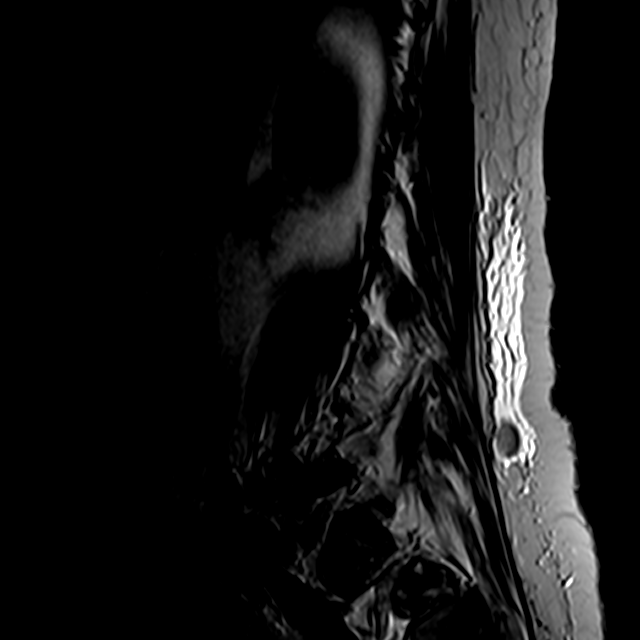

[13 of 48 positions shown; findings below may reference images not displayed]

FINDINGS: Segmentation:  Standard.

Alignment:  Normal.

Vertebrae: Widespread marrow replacement and heterogeneous
enhancement throughout the spine and visualized sacrum/pelvis
consistent with known metastases. Pathologic compression fractures
at L1 and L5 with progressive vertebral body height loss now
measuring 25% and 55%, respectively. Diminished left-sided epidural
tumor at T12-L1 as described on separate thoracic spine MRI. New
cm focus of dorsal epidural tumor at L3 resulting in mild spinal
stenosis. Marked progression of now large volume epidural tumor
centered at L5 and extending superiorly to L4 and inferiorly to S1
with severe spinal stenosis and complete effacement of the thecal
sac. Extraosseous tumor extension anterior to L5 as well.
Progressive destructive sacral tumor with extraosseous tumor
extension about the left sacral ala with involvement of the left S1
neural foramen.

Conus medullaris and cauda equina: Conus extends to the L1-2 level.
New curvilinear enhancement involving multiple cauda equina nerve
roots in the mid upper lumbar spine as well as thin enhancement
along the surface of the conus.

Paraspinal and other soft tissues: Bilateral renal cysts.

Disc levels:

Mild disc bulging and mild facet hypertrophy from T12-L1 to L3-4
without stenosis. Severe spinal stenosis from L4-S1 due to tumor.
IMPRESSION: 1. Progressive, large volume epidural tumor from L4-S1 with severe
spinal stenosis.
2. New enhancement along the cauda equina and surface of the conus
medullaris concerning for leptomeningeal tumor spread.
3. New small volume dorsal epidural tumor at L3 with mild spinal
stenosis.
4. L1 and L5 pathologic compression fractures with progressive
vertebral body height loss.
5. Progressive destructive left-sided sacral tumor with involvement
of the left S1 neural foramen.

## 2020-05-17 IMAGING — CT CT BIOPSY
1 of 2 series · 15 of 28 positions shown, 19 images · non-contrast
Comparison: none

INDICATION: 57-year-old female with pancytopenia

[Series 2: i-spiral 5.0 b40f · axial · 0.98mm/px · z∈[+1374,+1450]mm · 15 of 26 slices shown, 19 images]
[im 2/26  mediastinal]
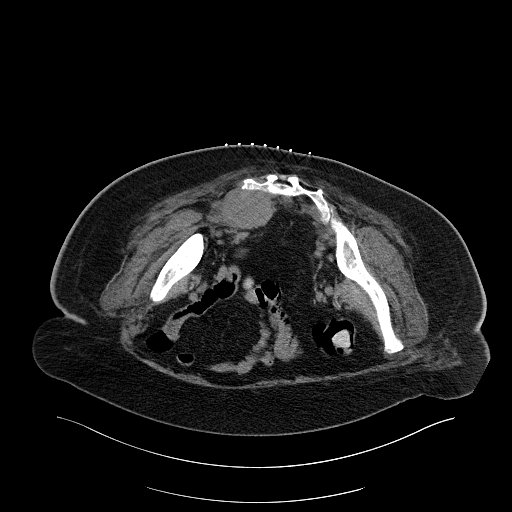
[im 2/26  lung]
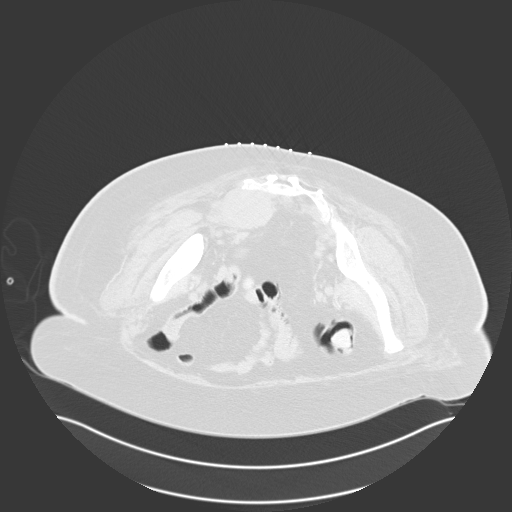
[im 4/26  lung]
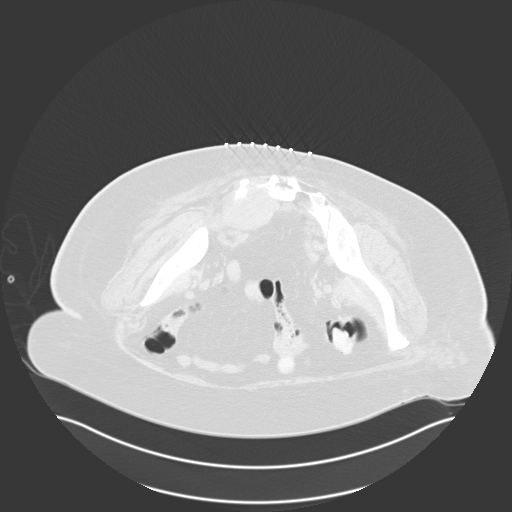
[im 5/26  lung]
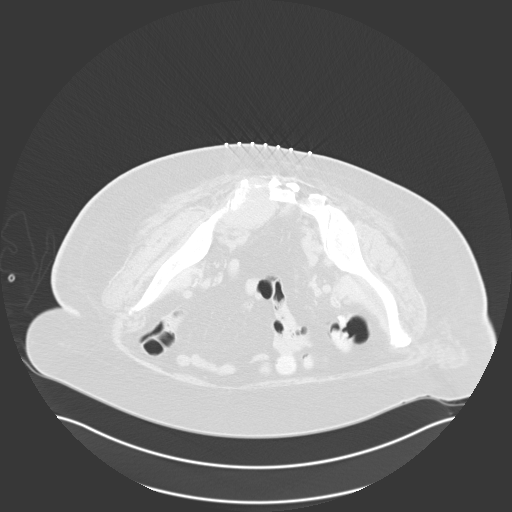
[im 7/26  lung]
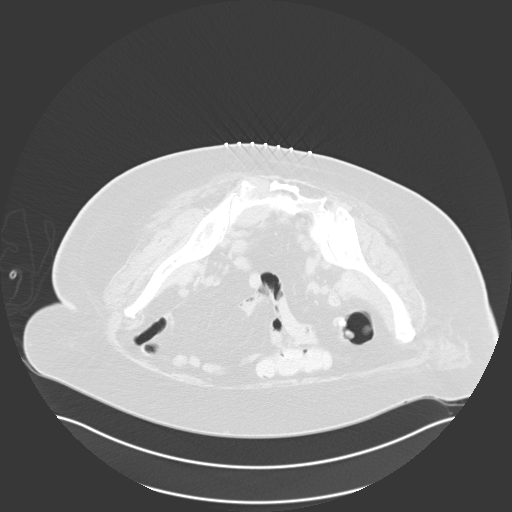
[im 8/26  mediastinal]
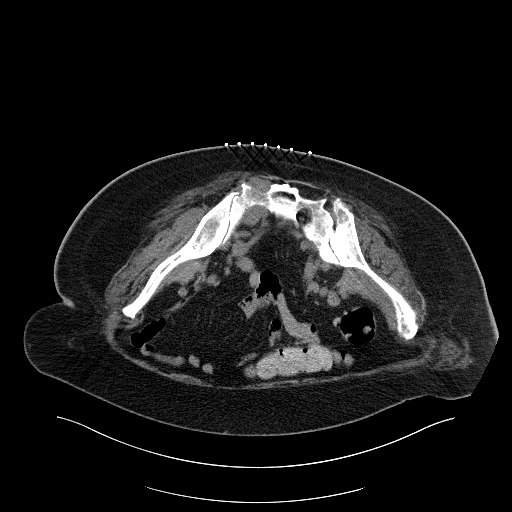
[im 8/26  lung]
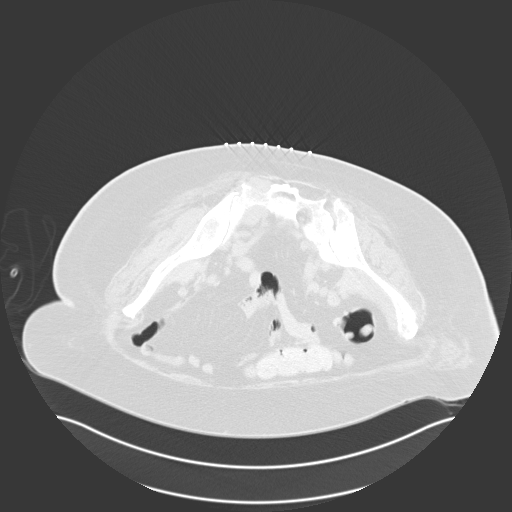
[im 10/26  lung]
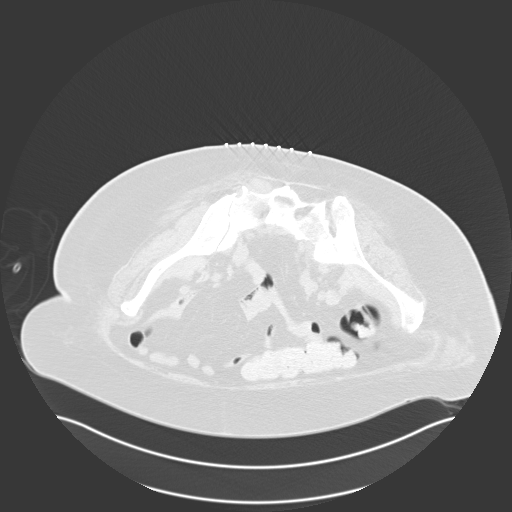
[im 11/26  lung]
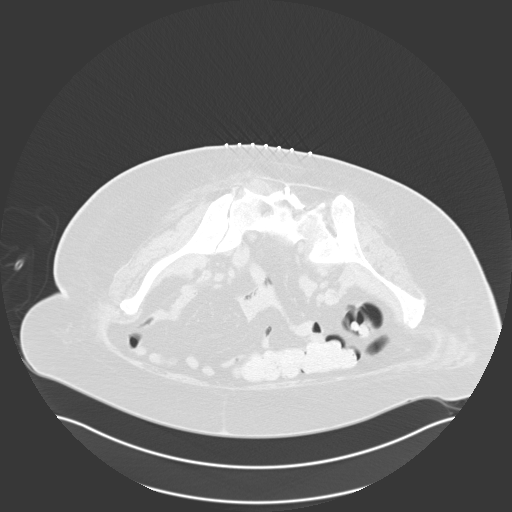
[im 13/26  lung]
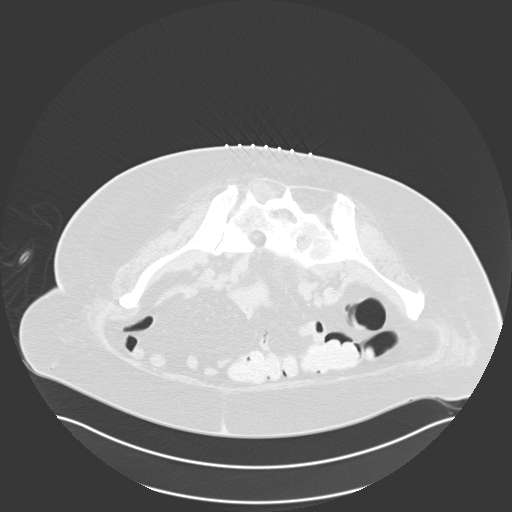
[im 15/26  mediastinal]
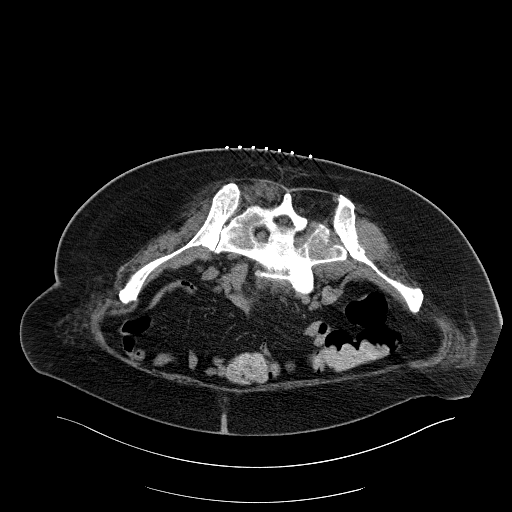
[im 15/26  lung]
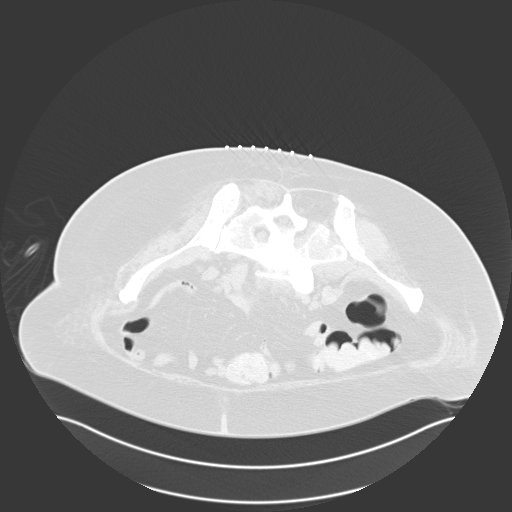
[im 16/26  lung]
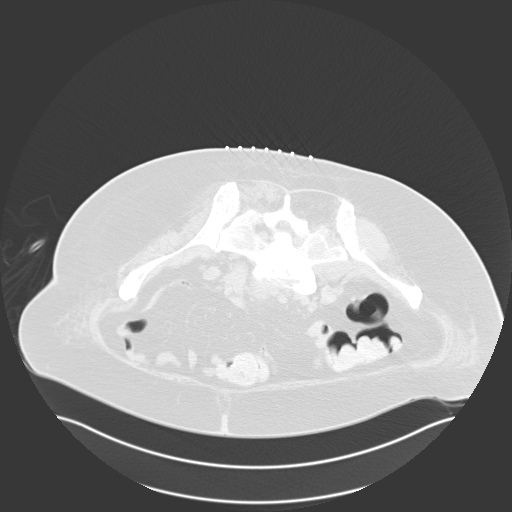
[im 18/26  lung]
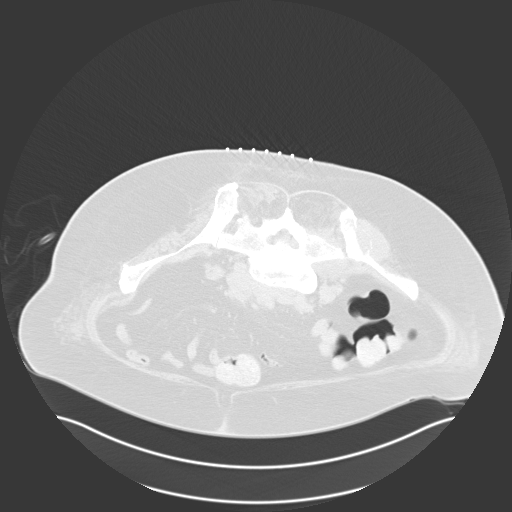
[im 19/26  lung]
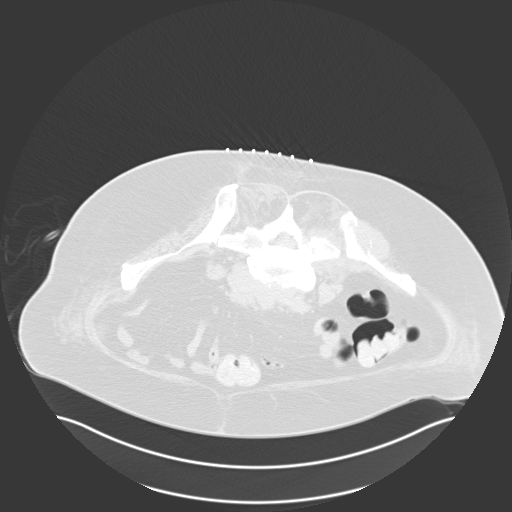
[im 21/26  mediastinal]
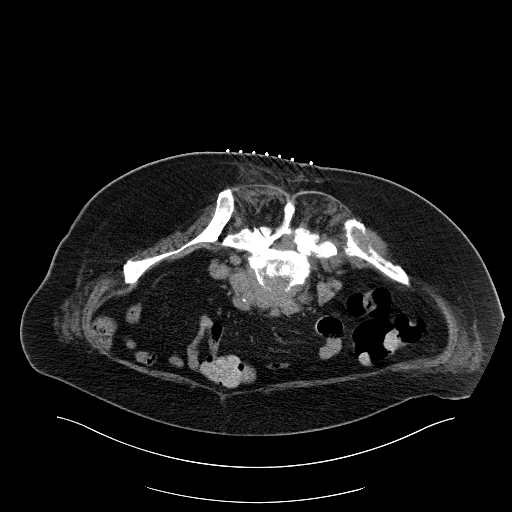
[im 21/26  lung]
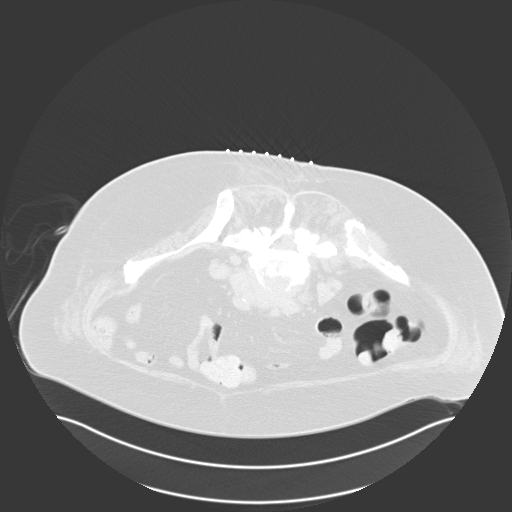
[im 22/26  lung]
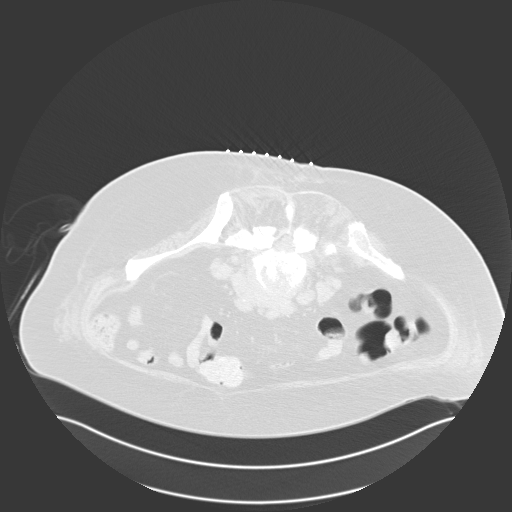
[im 24/26  lung]
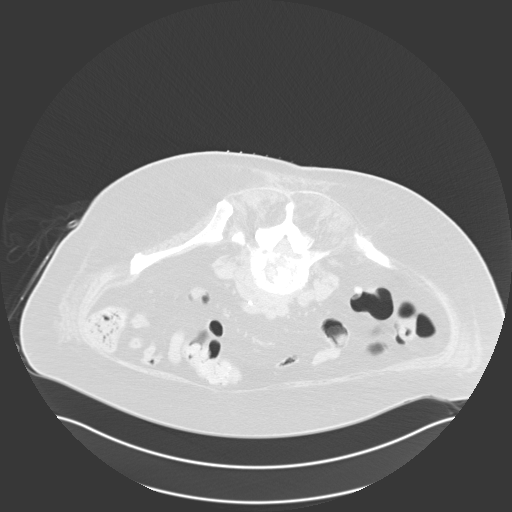

[15 of 28 positions shown; findings below may reference images not displayed]

EXAM:
CT BONE MARROW BIOPSY AND ASPIRATION; CT BIOPSY

MEDICATIONS:
None.

ANESTHESIA/SEDATION:
Moderate (conscious) sedation was employed during this procedure. A
total of Versed 2.0 mg and Fentanyl 100 mcg was administered
intravenously.

Moderate Sedation Time: 13 minutes. The patient's level of
consciousness and vital signs were monitored continuously by
radiology nursing throughout the procedure under my direct
supervision.

FLUOROSCOPY TIME:  CT

COMPLICATIONS:
None

PROCEDURE:
The procedure risks, benefits, and alternatives were explained to
the patient. Questions regarding the procedure were encouraged and
answered. The patient understands and consents to the procedure.

Scout CT of the pelvis was performed for surgical planning purposes.

The right posterior pelvis was prepped with Chlorhexidine in a
sterile fashion, and a sterile drape was applied covering the
operative field. A sterile gown and sterile gloves were used for the
procedure. Local anesthesia was provided with 1% Lidocaine.

Posterior right iliac bone was targeted for biopsy. The skin and
subcutaneous tissues were infiltrated with 1% lidocaine without
epinephrine. A small stab incision was made with an 11 blade
scalpel, and an 11 gauge Quirijn needle was advanced with CT guidance
to the posterior cortex. Manual forced was used to advance the
needle through the posterior cortex and the stylet was removed. A
bone marrow aspirate was retrieved and passed to a cytotechnologist
in the room. The Quirijn needle was then advanced without the stylet
for a core biopsy. The core biopsy was retrieved and also passed to
a cytotechnologist.

Manual pressure was used for hemostasis and a sterile dressing was
placed.

No complications were encountered no significant blood loss was
encountered.

Patient tolerated the procedure well and remained hemodynamically
stable throughout.
IMPRESSION: Status post CT-guided bone marrow biopsy, with tissue specimen sent
to pathology for complete histopathologic analysis
